# Patient Record
Sex: Male | Born: 1945 | Race: White | Hispanic: No | Marital: Married | Smoking: Never smoker
Health system: Southern US, Community
[De-identification: ages and names within clinical notes are randomized; demographics above are authoritative.]

## PROBLEM LIST (undated history)

## (undated) DIAGNOSIS — I10 Essential (primary) hypertension: Secondary | ICD-10-CM

## (undated) DIAGNOSIS — R197 Diarrhea, unspecified: Secondary | ICD-10-CM

## (undated) DIAGNOSIS — N400 Enlarged prostate without lower urinary tract symptoms: Secondary | ICD-10-CM

## (undated) DIAGNOSIS — F32A Depression, unspecified: Secondary | ICD-10-CM

## (undated) DIAGNOSIS — Z9989 Dependence on other enabling machines and devices: Secondary | ICD-10-CM

## (undated) DIAGNOSIS — F329 Major depressive disorder, single episode, unspecified: Secondary | ICD-10-CM

## (undated) DIAGNOSIS — E785 Hyperlipidemia, unspecified: Secondary | ICD-10-CM

## (undated) DIAGNOSIS — K219 Gastro-esophageal reflux disease without esophagitis: Secondary | ICD-10-CM

## (undated) DIAGNOSIS — I509 Heart failure, unspecified: Secondary | ICD-10-CM

## (undated) DIAGNOSIS — E669 Obesity, unspecified: Secondary | ICD-10-CM

## (undated) DIAGNOSIS — F419 Anxiety disorder, unspecified: Secondary | ICD-10-CM

## (undated) DIAGNOSIS — R091 Pleurisy: Secondary | ICD-10-CM

## (undated) DIAGNOSIS — G4733 Obstructive sleep apnea (adult) (pediatric): Secondary | ICD-10-CM

## (undated) DIAGNOSIS — R0902 Hypoxemia: Secondary | ICD-10-CM

## (undated) DIAGNOSIS — M1990 Unspecified osteoarthritis, unspecified site: Secondary | ICD-10-CM

## (undated) DIAGNOSIS — H269 Unspecified cataract: Secondary | ICD-10-CM

## (undated) DIAGNOSIS — G4730 Sleep apnea, unspecified: Secondary | ICD-10-CM

## (undated) DIAGNOSIS — I2510 Atherosclerotic heart disease of native coronary artery without angina pectoris: Secondary | ICD-10-CM

## (undated) HISTORY — PX: TONSILLECTOMY: SUR1361

## (undated) HISTORY — DX: Obesity, unspecified: E66.9

## (undated) HISTORY — DX: Hyperlipidemia, unspecified: E78.5

## (undated) HISTORY — DX: Benign prostatic hyperplasia without lower urinary tract symptoms: N40.0

## (undated) HISTORY — DX: Diarrhea, unspecified: R19.7

## (undated) HISTORY — DX: Essential (primary) hypertension: I10

## (undated) HISTORY — DX: Obstructive sleep apnea (adult) (pediatric): G47.33

## (undated) HISTORY — DX: Unspecified cataract: H26.9

## (undated) HISTORY — DX: Atherosclerotic heart disease of native coronary artery without angina pectoris: I25.10

## (undated) HISTORY — DX: Sleep apnea, unspecified: G47.30

## (undated) HISTORY — DX: Gastro-esophageal reflux disease without esophagitis: K21.9

## (undated) HISTORY — DX: Heart failure, unspecified: I50.9

## (undated) HISTORY — DX: Dependence on other enabling machines and devices: Z99.89

## (undated) HISTORY — DX: Unspecified osteoarthritis, unspecified site: M19.90

## (undated) HISTORY — DX: Major depressive disorder, single episode, unspecified: F32.9

## (undated) HISTORY — DX: Hypoxemia: R09.02

## (undated) HISTORY — DX: Pleurisy: R09.1

## (undated) HISTORY — DX: Depression, unspecified: F32.A

## (undated) HISTORY — DX: Anxiety disorder, unspecified: F41.9

---

## 1994-01-28 HISTORY — PX: LUNG DECORTICATION: SHX454

## 2003-06-05 ENCOUNTER — Emergency Department (HOSPITAL_COMMUNITY): Admission: EM | Admit: 2003-06-05 | Discharge: 2003-06-05 | Payer: Self-pay | Admitting: Emergency Medicine

## 2003-12-19 ENCOUNTER — Ambulatory Visit: Payer: Self-pay | Admitting: Internal Medicine

## 2003-12-20 ENCOUNTER — Ambulatory Visit: Payer: Self-pay

## 2004-01-27 ENCOUNTER — Ambulatory Visit: Payer: Self-pay | Admitting: Internal Medicine

## 2004-03-12 ENCOUNTER — Ambulatory Visit: Payer: Self-pay | Admitting: Internal Medicine

## 2004-09-03 ENCOUNTER — Ambulatory Visit: Payer: Self-pay | Admitting: Internal Medicine

## 2006-03-17 ENCOUNTER — Ambulatory Visit: Payer: Self-pay | Admitting: Internal Medicine

## 2006-06-09 ENCOUNTER — Ambulatory Visit: Payer: Self-pay | Admitting: Internal Medicine

## 2006-06-09 LAB — CONVERTED CEMR LAB
ALT: 37 units/L (ref 0–40)
Basophils Relative: 0.2 % (ref 0.0–1.0)
Bilirubin, Direct: 0.1 mg/dL (ref 0.0–0.3)
CO2: 31 meq/L (ref 19–32)
Cholesterol: 184 mg/dL (ref 0–200)
Direct LDL: 101.7 mg/dL
Eosinophils Relative: 1.1 % (ref 0.0–5.0)
GFR calc Af Amer: 110 mL/min
Glucose, Bld: 104 mg/dL — ABNORMAL HIGH (ref 70–99)
Hemoglobin: 15.6 g/dL (ref 13.0–17.0)
Lymphocytes Relative: 28 % (ref 12.0–46.0)
Monocytes Absolute: 0.7 10*3/uL (ref 0.2–0.7)
Neutro Abs: 4.1 10*3/uL (ref 1.4–7.7)
Neutrophils Relative %: 60.4 % (ref 43.0–77.0)
Potassium: 4.2 meq/L (ref 3.5–5.1)
TSH: 2.78 microintl units/mL (ref 0.35–5.50)
Total Protein: 6.8 g/dL (ref 6.0–8.3)
VLDL: 46 mg/dL — ABNORMAL HIGH (ref 0–40)
WBC: 6.8 10*3/uL (ref 4.5–10.5)

## 2006-06-16 ENCOUNTER — Ambulatory Visit: Payer: Self-pay | Admitting: Internal Medicine

## 2006-06-24 ENCOUNTER — Ambulatory Visit: Payer: Self-pay

## 2006-07-02 ENCOUNTER — Encounter: Payer: Self-pay | Admitting: Internal Medicine

## 2006-07-02 DIAGNOSIS — E785 Hyperlipidemia, unspecified: Secondary | ICD-10-CM

## 2006-07-02 DIAGNOSIS — E669 Obesity, unspecified: Secondary | ICD-10-CM

## 2006-07-02 DIAGNOSIS — N400 Enlarged prostate without lower urinary tract symptoms: Secondary | ICD-10-CM | POA: Insufficient documentation

## 2006-07-02 DIAGNOSIS — E291 Testicular hypofunction: Secondary | ICD-10-CM

## 2006-07-02 DIAGNOSIS — F329 Major depressive disorder, single episode, unspecified: Secondary | ICD-10-CM

## 2006-07-04 ENCOUNTER — Ambulatory Visit: Payer: Self-pay | Admitting: Gastroenterology

## 2006-07-22 ENCOUNTER — Encounter: Payer: Self-pay | Admitting: Gastroenterology

## 2006-07-22 ENCOUNTER — Ambulatory Visit: Payer: Self-pay | Admitting: Gastroenterology

## 2006-07-22 ENCOUNTER — Encounter: Payer: Self-pay | Admitting: Internal Medicine

## 2007-02-24 ENCOUNTER — Ambulatory Visit: Payer: Self-pay | Admitting: Internal Medicine

## 2007-02-24 DIAGNOSIS — R51 Headache: Secondary | ICD-10-CM

## 2007-02-24 DIAGNOSIS — R519 Headache, unspecified: Secondary | ICD-10-CM | POA: Insufficient documentation

## 2007-02-26 ENCOUNTER — Ambulatory Visit: Payer: Self-pay | Admitting: Cardiology

## 2007-03-05 ENCOUNTER — Telehealth (INDEPENDENT_AMBULATORY_CARE_PROVIDER_SITE_OTHER): Payer: Self-pay | Admitting: *Deleted

## 2007-03-16 ENCOUNTER — Telehealth: Payer: Self-pay | Admitting: Internal Medicine

## 2007-03-19 ENCOUNTER — Ambulatory Visit: Payer: Self-pay | Admitting: Internal Medicine

## 2007-03-20 ENCOUNTER — Telehealth: Payer: Self-pay | Admitting: Internal Medicine

## 2007-04-23 ENCOUNTER — Encounter: Payer: Self-pay | Admitting: Internal Medicine

## 2007-08-05 ENCOUNTER — Encounter: Payer: Self-pay | Admitting: Internal Medicine

## 2007-11-18 ENCOUNTER — Telehealth (INDEPENDENT_AMBULATORY_CARE_PROVIDER_SITE_OTHER): Payer: Self-pay

## 2007-12-08 ENCOUNTER — Ambulatory Visit: Payer: Self-pay | Admitting: Internal Medicine

## 2007-12-08 LAB — CONVERTED CEMR LAB
ALT: 31 units/L (ref 0–53)
AST: 29 units/L (ref 0–37)
Alkaline Phosphatase: 36 units/L — ABNORMAL LOW (ref 39–117)
Bilirubin, Direct: 0.1 mg/dL (ref 0.0–0.3)
CO2: 26 meq/L (ref 19–32)
Chloride: 110 meq/L (ref 96–112)
Glucose, Bld: 96 mg/dL (ref 70–99)
Hemoglobin: 14.5 g/dL (ref 13.0–17.0)
Ketones, urine, test strip: NEGATIVE
LDL Cholesterol: 93 mg/dL (ref 0–99)
Lymphocytes Relative: 26.6 % (ref 12.0–46.0)
Monocytes Relative: 7.8 % (ref 3.0–12.0)
Neutro Abs: 4.2 10*3/uL (ref 1.4–7.7)
Neutrophils Relative %: 64.5 % (ref 43.0–77.0)
Nitrite: NEGATIVE
Platelets: 218 10*3/uL (ref 150–400)
Potassium: 3.8 meq/L (ref 3.5–5.1)
RDW: 11.7 % (ref 11.5–14.6)
Sodium: 144 meq/L (ref 135–145)
Total Bilirubin: 0.9 mg/dL (ref 0.3–1.2)
Total CHOL/HDL Ratio: 4.6
Total Protein: 6.8 g/dL (ref 6.0–8.3)
Triglycerides: 185 mg/dL — ABNORMAL HIGH (ref 0–149)
Urobilinogen, UA: 0.2
VLDL: 37 mg/dL (ref 0–40)
WBC Urine, dipstick: NEGATIVE

## 2007-12-14 ENCOUNTER — Ambulatory Visit: Payer: Self-pay | Admitting: Internal Medicine

## 2007-12-15 ENCOUNTER — Telehealth (INDEPENDENT_AMBULATORY_CARE_PROVIDER_SITE_OTHER): Payer: Self-pay | Admitting: *Deleted

## 2008-01-06 ENCOUNTER — Telehealth: Payer: Self-pay | Admitting: Internal Medicine

## 2008-01-29 DIAGNOSIS — I2510 Atherosclerotic heart disease of native coronary artery without angina pectoris: Secondary | ICD-10-CM

## 2008-01-29 HISTORY — DX: Atherosclerotic heart disease of native coronary artery without angina pectoris: I25.10

## 2008-05-26 ENCOUNTER — Telehealth (INDEPENDENT_AMBULATORY_CARE_PROVIDER_SITE_OTHER): Payer: Self-pay | Admitting: *Deleted

## 2008-05-27 ENCOUNTER — Telehealth (INDEPENDENT_AMBULATORY_CARE_PROVIDER_SITE_OTHER): Payer: Self-pay | Admitting: *Deleted

## 2008-06-02 ENCOUNTER — Ambulatory Visit: Payer: Self-pay | Admitting: Family Medicine

## 2008-06-02 DIAGNOSIS — M7500 Adhesive capsulitis of unspecified shoulder: Secondary | ICD-10-CM

## 2008-06-02 DIAGNOSIS — R5381 Other malaise: Secondary | ICD-10-CM | POA: Insufficient documentation

## 2008-06-02 DIAGNOSIS — R5383 Other fatigue: Secondary | ICD-10-CM

## 2008-06-02 DIAGNOSIS — I10 Essential (primary) hypertension: Secondary | ICD-10-CM | POA: Insufficient documentation

## 2008-06-30 ENCOUNTER — Ambulatory Visit: Payer: Self-pay | Admitting: Family Medicine

## 2008-08-31 ENCOUNTER — Ambulatory Visit: Payer: Self-pay | Admitting: Family Medicine

## 2008-08-31 DIAGNOSIS — R0989 Other specified symptoms and signs involving the circulatory and respiratory systems: Secondary | ICD-10-CM | POA: Insufficient documentation

## 2008-08-31 DIAGNOSIS — R0609 Other forms of dyspnea: Secondary | ICD-10-CM | POA: Insufficient documentation

## 2008-09-13 ENCOUNTER — Encounter: Payer: Self-pay | Admitting: Family Medicine

## 2008-09-14 ENCOUNTER — Encounter: Payer: Self-pay | Admitting: Family Medicine

## 2008-09-23 ENCOUNTER — Telehealth (INDEPENDENT_AMBULATORY_CARE_PROVIDER_SITE_OTHER): Payer: Self-pay | Admitting: *Deleted

## 2008-09-28 ENCOUNTER — Telehealth: Payer: Self-pay | Admitting: Family Medicine

## 2008-09-29 ENCOUNTER — Ambulatory Visit: Payer: Self-pay | Admitting: Cardiology

## 2008-09-29 ENCOUNTER — Ambulatory Visit: Payer: Self-pay

## 2008-10-10 ENCOUNTER — Encounter: Payer: Self-pay | Admitting: Family Medicine

## 2008-10-10 ENCOUNTER — Encounter: Admission: RE | Admit: 2008-10-10 | Discharge: 2008-10-10 | Payer: Self-pay | Admitting: Family Medicine

## 2008-10-12 ENCOUNTER — Ambulatory Visit: Payer: Self-pay | Admitting: Critical Care Medicine

## 2008-10-12 ENCOUNTER — Ambulatory Visit: Payer: Self-pay | Admitting: Internal Medicine

## 2008-10-20 ENCOUNTER — Ambulatory Visit: Payer: Self-pay | Admitting: Critical Care Medicine

## 2008-10-21 ENCOUNTER — Encounter: Payer: Self-pay | Admitting: Critical Care Medicine

## 2008-10-26 ENCOUNTER — Ambulatory Visit: Payer: Self-pay | Admitting: Critical Care Medicine

## 2008-11-17 ENCOUNTER — Telehealth (INDEPENDENT_AMBULATORY_CARE_PROVIDER_SITE_OTHER): Payer: Self-pay | Admitting: *Deleted

## 2008-12-01 ENCOUNTER — Ambulatory Visit: Payer: Self-pay | Admitting: Family Medicine

## 2008-12-07 ENCOUNTER — Encounter: Payer: Self-pay | Admitting: Cardiology

## 2008-12-08 ENCOUNTER — Ambulatory Visit: Payer: Self-pay | Admitting: Cardiology

## 2008-12-08 DIAGNOSIS — R079 Chest pain, unspecified: Secondary | ICD-10-CM | POA: Insufficient documentation

## 2008-12-09 ENCOUNTER — Encounter: Payer: Self-pay | Admitting: Family Medicine

## 2008-12-14 ENCOUNTER — Ambulatory Visit: Payer: Self-pay | Admitting: Cardiology

## 2008-12-14 ENCOUNTER — Inpatient Hospital Stay (HOSPITAL_BASED_OUTPATIENT_CLINIC_OR_DEPARTMENT_OTHER): Admission: RE | Admit: 2008-12-14 | Discharge: 2008-12-14 | Payer: Self-pay | Admitting: Cardiology

## 2008-12-16 ENCOUNTER — Ambulatory Visit: Payer: Self-pay | Admitting: Cardiology

## 2008-12-16 ENCOUNTER — Ambulatory Visit (HOSPITAL_COMMUNITY): Admission: RE | Admit: 2008-12-16 | Discharge: 2008-12-16 | Payer: Self-pay | Admitting: Cardiology

## 2008-12-19 ENCOUNTER — Ambulatory Visit: Payer: Self-pay | Admitting: Cardiology

## 2008-12-19 DIAGNOSIS — I2510 Atherosclerotic heart disease of native coronary artery without angina pectoris: Secondary | ICD-10-CM | POA: Insufficient documentation

## 2008-12-19 LAB — CONVERTED CEMR LAB
ALT: 27 units/L (ref 0–53)
AST: 22 units/L (ref 0–37)
Alkaline Phosphatase: 47 units/L (ref 39–117)
CO2: 18 meq/L — ABNORMAL LOW (ref 19–32)
Creatinine, Ser: 1.05 mg/dL (ref 0.40–1.50)
LDL Cholesterol: 103 mg/dL — ABNORMAL HIGH (ref 0–99)
PSA: 0.87 ng/mL (ref 0.10–4.00)
Sodium: 142 meq/L (ref 135–145)
Total Bilirubin: 0.4 mg/dL (ref 0.3–1.2)
Total CHOL/HDL Ratio: 4.7
Total Protein: 6.5 g/dL (ref 6.0–8.3)
VLDL: 37 mg/dL (ref 0–40)

## 2008-12-26 ENCOUNTER — Telehealth: Payer: Self-pay | Admitting: Family Medicine

## 2008-12-28 ENCOUNTER — Ambulatory Visit: Payer: Self-pay | Admitting: Cardiology

## 2008-12-28 ENCOUNTER — Ambulatory Visit: Payer: Self-pay

## 2009-01-03 ENCOUNTER — Encounter: Payer: Self-pay | Admitting: Cardiology

## 2009-01-03 ENCOUNTER — Ambulatory Visit (HOSPITAL_COMMUNITY): Admission: RE | Admit: 2009-01-03 | Discharge: 2009-01-03 | Payer: Self-pay | Admitting: Cardiology

## 2009-01-03 ENCOUNTER — Ambulatory Visit: Payer: Self-pay | Admitting: Internal Medicine

## 2009-01-16 ENCOUNTER — Telehealth: Payer: Self-pay | Admitting: Cardiology

## 2009-01-17 ENCOUNTER — Telehealth (INDEPENDENT_AMBULATORY_CARE_PROVIDER_SITE_OTHER): Payer: Self-pay | Admitting: *Deleted

## 2009-01-18 ENCOUNTER — Ambulatory Visit: Payer: Self-pay | Admitting: Cardiology

## 2009-01-18 ENCOUNTER — Ambulatory Visit: Payer: Self-pay

## 2009-01-18 ENCOUNTER — Encounter (HOSPITAL_COMMUNITY): Admission: RE | Admit: 2009-01-18 | Discharge: 2009-01-27 | Payer: Self-pay | Admitting: Cardiology

## 2009-02-13 ENCOUNTER — Ambulatory Visit: Payer: Self-pay | Admitting: Cardiology

## 2009-02-17 ENCOUNTER — Telehealth: Payer: Self-pay | Admitting: Family Medicine

## 2009-02-20 LAB — CONVERTED CEMR LAB
ALT: 29 U/L
AST: 26 U/L
Albumin: 4 g/dL
Alkaline Phosphatase: 36 U/L — ABNORMAL LOW
Bilirubin, Direct: 0 mg/dL
Cholesterol: 161 mg/dL
Direct LDL: 89.4 mg/dL
HDL: 42.8 mg/dL
Total Bilirubin: 0.6 mg/dL
Total CHOL/HDL Ratio: 4
Total Protein: 6.5 g/dL
Triglycerides: 202 mg/dL — ABNORMAL HIGH
VLDL: 40.4 mg/dL — ABNORMAL HIGH

## 2009-03-03 ENCOUNTER — Ambulatory Visit: Payer: Self-pay | Admitting: Family Medicine

## 2009-03-06 LAB — CONVERTED CEMR LAB
BUN: 18 mg/dL (ref 6–23)
Hemoglobin: 14.2 g/dL (ref 13.0–17.0)
MCHC: 34.1 g/dL (ref 30.0–36.0)
PSA: 0.82 ng/mL (ref 0.10–4.00)
Platelets: 256 10*3/uL (ref 150–400)
Potassium: 4.6 meq/L (ref 3.5–5.3)
RDW: 12.8 % (ref 11.5–15.5)

## 2009-03-14 ENCOUNTER — Telehealth: Payer: Self-pay | Admitting: Family Medicine

## 2009-03-27 ENCOUNTER — Telehealth: Payer: Self-pay | Admitting: Family Medicine

## 2009-04-13 ENCOUNTER — Encounter (INDEPENDENT_AMBULATORY_CARE_PROVIDER_SITE_OTHER): Payer: Self-pay | Admitting: *Deleted

## 2009-06-07 ENCOUNTER — Ambulatory Visit: Payer: Self-pay | Admitting: Cardiology

## 2009-06-28 ENCOUNTER — Telehealth: Payer: Self-pay | Admitting: Cardiology

## 2009-07-04 ENCOUNTER — Telehealth: Payer: Self-pay | Admitting: Family Medicine

## 2009-07-14 ENCOUNTER — Telehealth: Payer: Self-pay | Admitting: Cardiology

## 2009-07-19 ENCOUNTER — Telehealth: Payer: Self-pay | Admitting: Cardiology

## 2009-11-28 ENCOUNTER — Ambulatory Visit: Payer: Self-pay | Admitting: Cardiology

## 2009-12-11 ENCOUNTER — Ambulatory Visit: Payer: Self-pay | Admitting: Family Medicine

## 2010-01-02 ENCOUNTER — Telehealth (INDEPENDENT_AMBULATORY_CARE_PROVIDER_SITE_OTHER): Payer: Self-pay | Admitting: *Deleted

## 2010-01-03 ENCOUNTER — Ambulatory Visit: Payer: Self-pay

## 2010-01-03 ENCOUNTER — Encounter (HOSPITAL_COMMUNITY)
Admission: RE | Admit: 2010-01-03 | Discharge: 2010-02-27 | Payer: Self-pay | Source: Home / Self Care | Attending: Cardiology | Admitting: Cardiology

## 2010-01-03 ENCOUNTER — Encounter: Payer: Self-pay | Admitting: *Deleted

## 2010-02-25 LAB — CONVERTED CEMR LAB
ALT: 31 units/L (ref 0–53)
Albumin: 4.4 g/dL (ref 3.5–5.2)
Albumin: 4.5 g/dL (ref 3.5–5.2)
Alkaline Phosphatase: 42 units/L (ref 39–117)
BUN: 18 mg/dL (ref 6–23)
Basophils Absolute: 0 10*3/uL (ref 0.0–0.1)
Basophils Relative: 0 % (ref 0.0–3.0)
Basophils Relative: 0 % (ref 0–1)
Bilirubin, Direct: 0.1 mg/dL (ref 0.0–0.3)
CO2: 18 meq/L — ABNORMAL LOW (ref 19–32)
Calcium: 9.2 mg/dL (ref 8.4–10.5)
Chloride: 108 meq/L (ref 96–112)
Chloride: 111 meq/L (ref 96–112)
Cholesterol: 153 mg/dL (ref 0–200)
Creatinine, Ser: 1.2 mg/dL (ref 0.4–1.5)
Eosinophils Absolute: 0.1 10*3/uL (ref 0.0–0.7)
Eosinophils Absolute: 0.1 10*3/uL (ref 0.0–0.7)
Glucose, Bld: 87 mg/dL (ref 70–99)
Glucose, Bld: 96 mg/dL (ref 70–99)
HCT: 41.2 % (ref 39.0–52.0)
Hemoglobin: 14.3 g/dL (ref 13.0–17.0)
Hemoglobin: 15.5 g/dL (ref 13.0–17.0)
Lymphs Abs: 1.6 10*3/uL (ref 0.7–4.0)
Lymphs Abs: 2.8 10*3/uL (ref 0.7–4.0)
MCHC: 33.6 g/dL (ref 30.0–36.0)
MCHC: 34.8 g/dL (ref 30.0–36.0)
MCV: 90.4 fL (ref 78.0–100.0)
MCV: 90.5 fL (ref 78.0–100.0)
Monocytes Absolute: 0.6 10*3/uL (ref 0.1–1.0)
Monocytes Absolute: 0.7 10*3/uL (ref 0.1–1.0)
Monocytes Relative: 9 % (ref 3–12)
Neutro Abs: 4.6 10*3/uL (ref 1.4–7.7)
Neutro Abs: 5 10*3/uL (ref 1.7–7.7)
Potassium: 4 meq/L (ref 3.5–5.1)
Prothrombin Time: 10.3 s (ref 9.1–11.7)
RBC: 4.56 M/uL (ref 4.22–5.81)
RBC: 5.1 M/uL (ref 4.22–5.81)
RDW: 12 % (ref 11.5–14.6)
Sodium: 140 meq/L (ref 135–145)
Total Bilirubin: 0.8 mg/dL (ref 0.3–1.2)
Total CHOL/HDL Ratio: 3
Total Protein: 7.1 g/dL (ref 6.0–8.3)
Total Protein: 7.2 g/dL (ref 6.0–8.3)
Triglycerides: 196 mg/dL — ABNORMAL HIGH (ref 0.0–149.0)
VLDL: 39.2 mg/dL (ref 0.0–40.0)
WBC: 7.3 10*3/uL (ref 4.0–10.5)

## 2010-02-27 NOTE — Assessment & Plan Note (Signed)
Summary: flu shot - jr  Nurse Visit   Vitals Entered By: Payton Spark CMA (December 11, 2009 9:59 AM)  Allergies: No Known Drug Allergies  Orders Added: 1)  Flu Vaccine 73yrs + MEDICARE PATIENTS [Q2039] 2)  Administration Flu vaccine - MCR [G0008] Flu Vaccine Consent Questions     Do you have a history of severe allergic reactions to this vaccine? no    Any prior history of allergic reactions to egg and/or gelatin? no    Do you have a sensitivity to the preservative Thimersol? no    Do you have a past history of Guillan-Barre Syndrome? no    Do you currently have an acute febrile illness? no    Have you ever had a severe reaction to latex? no    Vaccine information given and explained to patient? yes    Are you currently pregnant? no    Lot Number:[id number]   Exp Date:07/28/2010   Site Given  Left Deltoid IMmedflu

## 2010-02-27 NOTE — Progress Notes (Signed)
Summary: B/P readings-review B/P readings 07-14-09   Phone Note Outgoing Call   Call placed by: Katina Dung, RN, BSN,  July 14, 2009 10:46 AM Call placed to: Patient Summary of Call: B/P readings  Follow-up for Phone Call        Benicar changed to Losartan 50mg   06-29-09--call and get B/P --[facility] Katina Dung, RN, BSN  July 14, 2009 10:48 AM   Additional Follow-up for Phone Call Additional follow up Details #1::        BP READINGS 6/6 7:30AM 130/65 9:30 116/67 5:30 105/60 6/7 9:00AM 127/68 5:20120/67 6/8 9:00AM 139/76 9:20 116/68 8:30PM 119/62 6/9 120/63 10:00AM 111/65 6/10 10:30AM 127/65 6/11 11:30AM 124/71 6:20 122/61 6/12 2:30PM 126/63 8:28PM 133/65 8:50PM 110/62 6/13 11:00AM 116/68 3:26PM 110/60 6/14 12:30 116/64 6:00PM 126/64 6/15 10:30AM 126/69 10:00PM 123/63 6/16 10:00AM 122/74 2:45 111/64 7:17PM 131/65  Still has SOB with activity will forward to Dr Shirlee Latch for review Additional Follow-up by: Migdalia Dk,  July 14, 2009 10:57 AM     Appended Document: B/P readings-review B/P readings 07-14-09 BP looks good.   Appended Document: B/P readings-review B/P readings 07-14-09 discussed with pt by telephone

## 2010-02-27 NOTE — Letter (Signed)
Summary: Appointment - Reminder 2  [facility], Main Office  [address]   [place], [place] [address]   Phone: [phone]  Fax: [phone]     April 13, 2009 MRN: [id number]   [facility] [address] [place], [place]  [address]   Dear Mr. HETLAND,  Our records indicate that it is time to schedule a follow-up appointment with Dr. Shirlee Latch. It is very important that we reach you to schedule this appointment. We look forward to participating in your health care needs. Please contact us at the number listed above at your earliest convenience to schedule your appointment.  If you are unable to make an appointment at this time, give [place] a call so we can update our records.     Sincerely,   Migdalia Dk [facility] Scheduling Team

## 2010-02-27 NOTE — Progress Notes (Signed)
Summary: Nuclear Pre-Procedure  Phone Note Outgoing Call   Call placed by: Milana Na, EMT-P,  January 02, 2010 4:15 PM Summary of Call: Left message with information on Myoview Information Sheet (see scanned document for details).      Nuclear Med Background Indications for Stress Test: Evaluation for Ischemia  Indications Comments: 01/03/09 Abnormal Cardiopulmonary GXT-possible ischemia.  History: COPD, Emphysema, Heart Catheterization, Myocardial Perfusion Study  History Comments: '08 MPS:No ischemia, EF=72%; 11/10 Cath:n/o CAD-LAD  Symptoms: Chest Tightness, Chest Tightness with Exertion, DOE, SOB    Nuclear Pre-Procedure Cardiac Risk Factors: Family History - CAD, Hypertension, Lipids, Obesity Height (in): 72.5  Nuclear Med Study Referring MD:  D.Mclean

## 2010-02-27 NOTE — Progress Notes (Signed)
Summary: crestor prescription  Medications Added CRESTOR 40 MG TABS (ROSUVASTATIN CALCIUM) one daily or as directed       Phone Note Call from Patient   Caller: Patient Call For: Dr Shirlee Latch  Summary of Call: Crestor prescription  Follow-up for Phone Call        requesting 90 days Crestor 40mg  prescription--take one daily or as directed -pt knows  to take one-half daily--reviewed with Dr Shirlee Latch and OK'd    New/Updated Medications: CRESTOR 40 MG TABS (ROSUVASTATIN CALCIUM) one daily or as directed Prescriptions: CRESTOR 40 MG TABS (ROSUVASTATIN CALCIUM) one daily or as directed  #90 x 3   Entered by:   Katina Dung, RN, BSN   Authorized by:   Marca Ancona, MD   Signed by:   Katina Dung, RN, BSN on 07/19/2009   Method used:   Print then Give to Patient   RxID:   [id number]

## 2010-02-27 NOTE — Letter (Signed)
Summary: AUA Questionnaire/[place] Jeremy Woodward  AUA Questionnaire/[place] Jeremy Woodward   Imported By: Lanelle Bal 03/09/2009 12:49:51  _____________________________________________________________________  External Attachment:    Type:   Image     Comment:   External Document

## 2010-02-27 NOTE — Progress Notes (Signed)
Summary: Lexapro denied  Phone Note Call from Patient   Caller: Patient Summary of Call: Pt's Rx plan denied Lexapro bc he must use a generic. Pt is open to changing to similar generic but not wellbutrin. Please advise.  Initial call taken by: Payton Spark CMA,  February 17, 2009 9:13 AM    New/Updated Medications: CITALOPRAM HYDROBROMIDE 10 MG TABS (CITALOPRAM HYDROBROMIDE) 1 tab by mouth daily Prescriptions: CITALOPRAM HYDROBROMIDE 10 MG TABS (CITALOPRAM HYDROBROMIDE) 1 tab by mouth daily  #30 x 1   Entered and Authorized by:   Seymour Bars DO   Signed by:   Seymour Bars DO on 02/17/2009   Method used:   Electronically to        [facility] [phone]* (retail)       [address] [place], [place]  [address]       Ph: [phone] or [phone]       Fax: [phone]   RxID:   [id number]   Appended Document: Lexapro denied Pt aware

## 2010-02-27 NOTE — Progress Notes (Signed)
Summary: Melatonin ?  Phone Note Call from Patient   Caller: Patient Summary of Call: Pt wanted to know if it will be OK to take melatonin in place of Ambien? Initial call taken by: Payton Spark CMA,  July 04, 2009 8:44 AM  Follow-up for Phone Call        he can try this if he wants.  do not combine. Follow-up by: Seymour Bars DO,  July 04, 2009 8:57 AM     Appended Document: Melatonin ? Pt's wife aware

## 2010-02-27 NOTE — Progress Notes (Signed)
Summary: ? Crestor  Phone Note Call from Patient   Caller: Patient Summary of Call: Pt LMOM stating he has accidently been taking Crestor two times a day. Pt is now out of med and wants to make sure that taking 2 for 3 weeks will not affect him. Please advise.  Initial call taken by: Payton Spark CMA,  March 27, 2009 8:25 AM  Follow-up for Phone Call        Pls make sure that pt only takes his Crestor once a day (in the evenings). Not likely to cause any harm the way he was taking it. Follow-up by: Seymour Bars DO,  March 27, 2009 8:29 AM     Appended Document: ? Crestor LMOM informing Pt of the above.

## 2010-02-27 NOTE — Assessment & Plan Note (Signed)
Summary: 6 month/dmp  Medications Added GABAPENTIN 600 MG TABS (GABAPENTIN) take one tablet once daily COREG 6.25 MG TABS (CARVEDILOL) one tablet twice a day      Allergies Added: NKDA  Referring Provider:  Dr. Cathey Endow Primary Provider:  Seymour Bars DO  CC:  6 month follow up.  Pt still having some SOB but no chest pains.  .  History of Present Illness: 65 yo with history of empyema and VATS/decortication, HTN, CAD, and exertional shortness of breath.  I did a left and right heart catheterization given his exertional chest pain and dyspnea.  Right heart cath showed normal left and right heart filling pressures and no pulmonary hypertension.   Left heart cath showed 50% distal left main stenosis and 50% proximal LAD stenosis.  IVUS and FFR were done of both lesions, and it appeared that both were not hemodynamically significant and not likely to be the cause of his symptoms.  He had a cardiopulmonary stress test in 12/10 to try to elucidate the cause of his shortness of breath.  This was actually suggestive of possible ischemia.  Therefore, he did an ETT-myoview in 12/10 as well.  He became quite short of breath but exercised 8 minutes with no ECG changes and no evidence for ischemia or infarction.  It was decided to continue medical management.   He has been doing well over the last few months.  He is trying to get back into exercising more.  Unfortunately, he has gained about 19 lbs since the last appointment.  He is not short of breath walking up steps.  He goes for hikes in the woods with shortness of breath if he goes up a hill.  He rides his bike for exercise and has no problem on flat ground.  He gets short of breath with hills.  No chest pain.    Labs (8/10): creatinine 1.3, BNP 4 Labs (11/10): creatinine 1.05, K 4.4, LDL 103, HDL 38 Labs (1/11): LDL 89 Labs (2/11): creatinine 1.2  ECG: NSR, iRBBB  Current Medications (verified): 1)  Ambien 10 Mg Tabs (Zolpidem Tartrate) .... Take 1  Tablet By Mouth At Bedtime As Needed 2)  Crestor 20 Mg Tabs (Rosuvastatin Calcium) .... One Tablet Daily 3)  Gabapentin 600 Mg Tabs (Gabapentin) .... Take One Tablet Once Daily 4)  Benicar 20 Mg  Tabs (Olmesartan Medoxomil) .... One Tablet By Mouth Daily 5)  Zyrtec Allergy 10 Mg Tabs (Cetirizine Hcl) .... Once Daily 6)  Fish Oil 1000 Mg Caps (Omega-3 Fatty Acids) .Marland Kitchen.. 1 Tab Three Times A Day 7)  Multivitamins  Tabs (Multiple Vitamin) .... Once Daily 8)  Astepro 0.15 % Soln (Azelastine Hcl) .... Two Sprays Each Nostril Daily 9)  Aspirin 81 Mg Tbec (Aspirin) .Marland Kitchen.. 1 Tablet Daily 10)  Citalopram Hydrobromide 10 Mg Tabs (Citalopram Hydrobromide) .Marland Kitchen.. 1 Tab By Mouth Daily 11)  Aleve .... As Needed 12)  Diphen 2.5 Mg .... As Needed For Diarrhea 13)  Aloe Vera Capsule 2.5 Mg .... Two Times A Day For Heartburn  Allergies (verified): No Known Drug Allergies  Past History:  Past Medical History: 1. Depression 2. Hyperlipidemia 3. Obesity 4. Benign prostatic hypertrophy 5. cervical radiculopathy, 1995 6. cough - induced headache, Dr Neale Burly 7. HTN: ACEI cough and probable upper airways instability. 8. CAD: ETT (9/10): 9', 10.4 METS, no ECG changes. Mild chest tightness at peak exercise.  LHC (11/10): EF 55%, 50% distal left main, 50% proximal LAD.  Patient underwent both IVUS and FFR, both  suggested that the left main and LAD stenoses were not hemodynamically significant.  Cardiopulmonary exercise test then done in 12/10 to assess for cause of dyspnea.  This showed a hypertensive response to exercise and possible diastolic dysfunction.  No ECG changes.  There was an abrupt decrease in oxygen pulse raising concern for ischemia.  Therefore ETT-myoview was done in 12/10.  Patient went 8 minutes and stopped due to SOB.  No ECG changes and no evidence for ischemia or infarction. Medical management continued.  9. Exertional dyspnea: Pulmonary workup with PFTs (9/10) showing spirometry, DLCO, and lung  volumes not significantly abnormal.  CTA chest without PE.  ACEI changed to ARB with some improvement in symptoms suggesting component of upper airways instability.  Right heart catheterization (11/10): normal left and right heart filling pressures, no pulmonary HTN. Cardiopulmonary exercise test as described above.   pulm Dr Delford Field cards Dr Shirlee Latch  Family History: Reviewed history from 12/08/2008 and no changes required. - father died age 60, liver cancer, lung ca - mother died at 44, status post CABG, peptic ulcer disease, hypertension, history of abdominal aortic aneurysm.  Had MI in her 8s.  - one sister with obesity, hypertension, hyperlipidemia - MGM-stomach cancer, abdominal aortic aneurysm - Uncle with MI  Social History: Reviewed history from 12/08/2008 and no changes required. 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   3-4cigars/month x12yrs.  quit in 2005. Married 1 son, 1 step daughter Lives in [place]  Review of Systems       All systems reviewed and negative except as per HPI.   Vital Signs:  Patient profile:   65 year old male Height:      72.5 inches Weight:      269 pounds BMI:     36.11 Pulse rate:   74 / minute Pulse rhythm:   regular BP sitting:   126 / 80  (left arm) Cuff size:   large  Vitals Entered By: Judithe Modest CMA (Jun 07, 2009 8:42 AM)  Physical Exam  General:  Well developed, well nourished, in no acute distress. Obese.  Neck:  Neck supple, no JVD. No masses, thyromegaly or abnormal cervical nodes. Lungs:  Clear bilaterally to auscultation and percussion. Heart:  Non-displaced PMI, chest non-tender; regular rate and rhythm, S1, S2 without murmurs, rubs or gallops. Carotid upstroke normal, no bruit.  Pedals normal pulses. No edema, no varicosities. Abdomen:  Bowel sounds positive; abdomen soft and non-tender without masses, organomegaly, or hernias noted. No hepatosplenomegaly. Extremities:  No  clubbing or cyanosis. Neurologic:  Alert and oriented x 3. Psych:  Normal affect.   Impression & Recommendations:  Problem # 1:  CAD, NATIVE VESSEL (ICD-414.01) 50% left main stenosis on cath but IVUS and FFR suggested that it was no hemodynamically significant.  Cardiopulmonary exercise test done to assess for cause of shortness of breath suggested diastolic dysfunction but also suggested possible ischemia.  Therefore, ETT-myoview was done showing no ECG changes and no evidence for ischemia or infarction.  I am managing the patient medically at this time.  His exertional dyspnea is actually improving with increased exercise.  No chest pain.  - Continue statin with goal LDL < 70 - ASA, ARB - Initiate beta blocker today: Coreg 6.25 mg two times a day.   Problem # 2:  DYSPNEA ON EXERTION (ICD-786.09) Cause is still difficult to ascertain.  Possible deconditioning/weight gain, possible diastolic dysfunction.  CPX suggested possibility of ischemia but other testing has not been suggestive  of ischemia.  Dyspnea is improving with increased exercise recently.  He needs to work on diet and exercise for weight loss.    Problem # 3:  HYPERLIPIDEMIA (ICD-272.4) Lipids/LFTs today with goal LDL < 70.   Other Orders: EKG w/ Interpretation (93000) TLB-Lipid Panel (80061-LIPID) TLB-Hepatic/Liver Function Pnl (80076-HEPATIC)  Patient Instructions: 1)  Your physician has recommended you make the following change in your medication:  2)  start Coreg(carvedilol) 6.25mg  twice a day 3)  Your physician recommends that you have  a  lipid profile/liver profile today---414.01 272.4 4)  Your physician wants you to follow-up in: 6 months with Dr Shirlee Latch. You will receive a reminder letter in the mail two months in advance. If you don't receive a letter, please call our office to schedule the follow-up appointment. Prescriptions: COREG 6.25 MG TABS (CARVEDILOL) one tablet twice a day  #60 x 11   Entered by:   Katina Dung, RN, BSN   Authorized by:   Marca Ancona, MD   Signed by:   Katina Dung, RN, BSN on 06/07/2009   Method used:   [facility]        [facility] [phone][facility] (retail)       [address] [place], [place]  [address]       Ph: [phone] or [phone]       Fax: [phone]   RxID:   [id number]

## 2010-02-27 NOTE — Progress Notes (Signed)
Summary: HA med changed  Phone Note Call from Patient   Caller: Patient Summary of Call: Pt LMOM stating that his HA doctor changed toprimate to neurontin. Pt wanted to know if you agree w/ this?  Please advise.  Initial call taken by: Payton Spark CMA,  March 14, 2009 9:56 AM  Follow-up for Phone Call        that is fine.  Pls update EMR. Follow-up by: Seymour Bars DO,  March 14, 2009 10:11 AM     Appended Document: HA med changed LMOM informing Pt. Pt to CB w/ dose and schedule

## 2010-02-27 NOTE — Assessment & Plan Note (Signed)
Summary: per check out/sf  Medications Added ZONISAMIDE 100 MG CAPS (ZONISAMIDE) 3 daily NAPROXEN SODIUM 550 MG TABS (NAPROXEN SODIUM) as needed      Allergies Added: NKDA  Visit Type:  Follow-up Referring Provider:  Dr. Cathey Endow Primary Provider:  Seymour Bars DO  CC:  Sob- Tiredness.  History of Present Illness: 65 yo with history of empyema and VATS/decortication, HTN, CAD, and exertional shortness of breath.  I did a left and right heart catheterization given his exertional chest pain and dyspnea.  Right heart cath showed normal left and right heart filling pressures and no pulmonary hypertension.   Left heart cath showed 50% distal left main stenosis and 50% proximal LAD stenosis.  IVUS and FFR were done of both lesions, and it appeared that both were not hemodynamically significant and not likely to be the cause of his symptoms.  He had a cardiopulmonary stress test in 12/10 to try to elucidate the cause of his shortness of breath.  This was actually suggestive of possible ischemia.  Therefore, he did an ETT-myoview in 12/10 as well.  He became quite short of breath but exercised 8 minutes with no ECG changes and no evidence for ischemia or infarction.  It was decided to continue medical management.   Symptoms have been stable.  He is short of breath walking up hills when he goes on hikes.  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  He does get short of breath if he bends over.  No significant dyspnea when walking on flat ground.  Patient has occasional momentary (seconds) sharp left-sided chest pain that often occurs with exertion.   The pain lasts only for a second or two and goes away despite continued exertion.  Weight is down 4 lbs since I saw him last.  He has started CPAP for OSA.   Labs (8/10): creatinine 1.3, BNP 4 Labs (11/10): creatinine 1.05, K 4.4, LDL 103, HDL 38 Labs (1/11): LDL 89 Labs  (2/11): creatinine 1.2  ECG: NSR, normal  Current Medications (verified): 1)  Crestor 40 Mg Tabs (Rosuvastatin Calcium) .... One Daily or As Directed 2)  Losartan Potassium 50 Mg Tabs (Losartan Potassium) .... One Tablet Daily 3)  Multivitamins  Tabs (Multiple Vitamin) .... Once Daily 4)  Aspirin 81 Mg Tbec (Aspirin) .Marland Kitchen.. 1 Tablet Daily 5)  Citalopram Hydrobromide 10 Mg Tabs (Citalopram Hydrobromide) .Marland Kitchen.. 1 Tab By Mouth Daily 6)  Coreg 6.25 Mg Tabs (Carvedilol) .... One Tablet Twice A Day 7)  Zonisamide 100 Mg Caps (Zonisamide) .... 3 Daily 8)  Naproxen Sodium 550 Mg Tabs (Naproxen Sodium) .... As Needed  Allergies (verified): No Known Drug Allergies  Past History:  Past Medical History: 1. Depression 2. Hyperlipidemia 3. Obesity 4. Benign prostatic hypertrophy 5. cervical radiculopathy, 1995 6. cough - induced headache, Dr Neale Burly 7. HTN: ACEI cough and probable upper airways instability. 8. CAD: ETT (9/10): 9', 10.4 METS, no ECG changes. Mild chest tightness at peak exercise.  LHC (11/10): EF 55%, 50% distal left main, 50% proximal LAD.  Patient underwent both IVUS and FFR, both suggested that the left main and LAD stenoses were not hemodynamically significant.  Cardiopulmonary exercise test then done in 12/10 to assess for cause of dyspnea.  This showed a hypertensive response to exercise and possible diastolic dysfunction.  No ECG changes.  There was an abrupt decrease in oxygen pulse raising concern for ischemia.  Therefore ETT-myoview was done  in 12/10.  Patient went 8 minutes and stopped due to SOB.  No ECG changes and no evidence for ischemia or infarction. Medical management continued.  9. Exertional dyspnea: Pulmonary workup with PFTs (9/10) showing spirometry, DLCO, and lung volumes not significantly abnormal.  CTA chest without PE.  ACEI changed to ARB with some improvement in symptoms suggesting component of upper airways instability.  Right heart catheterization (11/10):  normal left and right heart filling pressures, no pulmonary HTN. Cardiopulmonary exercise test as described above.  10. OSA: Using CPAP.   pulm Dr Delford Field cards Dr Shirlee Latch  Family History: Reviewed history from 12/08/2008 and no changes required. - father died age 20, liver cancer, lung ca - mother died at 51, status post CABG, peptic ulcer disease, hypertension, history of abdominal aortic aneurysm.  Had MI in her 30s.  - one sister with obesity, hypertension, hyperlipidemia - MGM-stomach cancer, abdominal aortic aneurysm - Uncle with MI  Social History: Reviewed history from 12/08/2008 and no changes required. 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  quit in 2005. Married 1 son, 1 step daughter Lives in [place]  Review of Systems       All systems reviewed and negative except as per HPI.   Vital Signs:  Patient profile:   65 year old male Height:      72.5 inches Weight:      265 pounds BMI:     35.57 Pulse rate:   62 / minute Pulse rhythm:   regular Resp:     18 per minute BP sitting:   118 / 82  (left arm) Cuff size:   large  Vitals Entered By: Vikki Ports (November 28, 2009 9:44 AM)  Physical Exam  General:  Well developed, well nourished, in no acute distress. Obese.  Neck:  Neck supple, no JVD. No masses, thyromegaly or abnormal cervical nodes. Lungs:  Clear bilaterally to auscultation and percussion. Heart:  Non-displaced PMI, chest non-tender; regular rate and rhythm, S1, S2 without murmurs, rubs or gallops. Carotid upstroke normal, no bruit.  Pedals normal pulses. No edema, no varicosities. Abdomen:  Bowel sounds positive; abdomen soft and non-tender without masses, organomegaly, or hernias noted. No hepatosplenomegaly. Extremities:  No clubbing or cyanosis. Neurologic:  Alert and oriented x 3. Psych:  Normal affect.   Impression & Recommendations:  Problem # 1:  CAD, NATIVE VESSEL  (ICD-414.01) 50% left main stenosis on cath but IVUS and FFR suggested that it was not hemodynamically significant.  Cardiopulmonary exercise test done to assess for cause of shortness of breath suggested diastolic dysfunction but also suggested possible ischemia.  Therefore, ETT-myoview was done in 12/10 showing no ECG changes and no evidence for ischemia or infarction.  I am managing the patient medically at this time.  He has mild exertional dyspnea that is stable as well as atypical chest pain.  - Continue statin with goal LDL < 70 - ASA, ARB, beta blocker - Given left main lesion, I am going to repeat ETT-myoview at one year (12/11) to assess for progression.   Problem # 2:  DYSPNEA ON EXERTION (ICD-786.09) Cause is still difficult to ascertain.  Possible deconditioning/weight gain, possible diastolic dysfunction.  CPX suggested possibility of ischemia but other testing has not been suggestive of ischemia.  Dyspnea is improving with increased exercise recently.  He needs to continue to work on diet and exercise for weight loss.    Problem # 3:  HYPERLIPIDEMIA (ICD-272.4) Lipids/LFTs today,  goal LDL < 70.   Other Orders: Nuclear Stress Test (Nuc Stress Test) TLB-Lipid Panel (80061-LIPID) TLB-Hepatic/Liver Function Pnl (80076-HEPATIC)  Patient Instructions: 1)  Your physician recommends that you return for a FASTING lipid profile/liver profile  414.01  786.09 2)  Your physician has requested that you have an exercise stress myoview.  For further information please visit [email or web address]  Please follow instruction sheet, as given. 3)  Your physician wants you to follow-up in: 6 months with Dr Shirlee Latch.  You will receive a reminder letter in the mail two months in advance. If you don't receive a letter, please call our office to schedule the follow-up appointment.

## 2010-02-27 NOTE — Progress Notes (Signed)
Summary: pt wants to discuss meds   Phone Note Call from Patient Call back at Home Phone [phone]   Caller: Patient Reason for Call: Talk to Nurse, Talk to Doctor Summary of Call: per pt call he was calling to tellus what generic the drug company will approve it is called Losartan instead of Benicar and pt is completely [facility] [place] [facility] [address] pt also would like to get crestor in 40mg  and cut it in half to cut the cost pt would like to have the crestor Rx mailed to him so he can send to his mail order when he needs it if thats ok with Dr. Shirlee Latch Initial call taken by: Omer Jack,  June 28, 2009 10:30 AM  Follow-up for Phone Call        Spoke with pt. Patient states  the medication his insurance covers is [place] instead of Benicar. Pt. states when Dr. Shirlee Latch okay the medication, he would like the [facility] [place] [facility] [address]  Pt. also would like to have a prescription for Crestor 40 mg and cut pill in half to cut the cost of medication.  Pt. would like the prescription  mailed to him a 90 days supply. Pt. will mail the prescription to his mail order pharmacy. I let pt. know will send message to the MD and his nurse. Okay with pt.     Appended Document: pt wants to discuss meds d/c Benicar, start losartan 50 mg daily.  BP check over phone in 2 wks.   Appended Document: pt wants to discuss meds discussed with pt by telephone   Clinical Lists Changes  Medications: Changed medication from BENICAR 20 MG  TABS (OLMESARTAN MEDOXOMIL) One tablet by mouth daily to LOSARTAN POTASSIUM 50 MG TABS (LOSARTAN POTASSIUM) one tablet daily - Signed Rx of LOSARTAN POTASSIUM 50 MG TABS (LOSARTAN POTASSIUM) one tablet daily;  #30 x 11;  Signed;  Entered by: Katina Dung, RN, BSN;  Authorized by: Marca Ancona, MD;  Method used: [facility]ically to [facility] [phone]*, [address] [place], [place]  [address], Ph:  [phone] or [phone], Fax: [phone]    Prescriptions: LOSARTAN POTASSIUM 50 MG TABS (LOSARTAN POTASSIUM) one tablet daily  #30 x 11   Entered by:   Katina Dung, RN, BSN   Authorized by:   Marca Ancona, MD   Signed by:   Katina Dung, RN, BSN on 06/29/2009   Method used:   Electronically to        [facility] [id number] (retail)       [address] [place], [place]  [address]       Ph: [phone] or [phone]       Fax: [phone]   RxID:   [id number]

## 2010-02-27 NOTE — Assessment & Plan Note (Signed)
Summary: CPE   Vital Signs:  Patient profile:   65 year old male Height:      72.5 inches Weight:      256.25 pounds BMI:     34.40 Pulse rate:   81 / minute BP sitting:   114 / 69  Vitals Entered By: Kandice Hams (March 03, 2009 8:36 AM) CC: cpx   Primary Care Provider:  Seymour Bars DO  CC:  cpx.  History of Present Illness: 65 yo WM presents for CPE.    He has hx of CAD,. HAs, depression, HTN and dyslipidemia.  He has undergone a full pulmonary and cardiology w/u in the past year for c/o DOE.  His cardiologist has cleared him to start exercising which he is not yet doing.  His statin was recently changed to Crestor in hopes of getting his LDL to a goal of <70.  Denies any CP, leg swelling.  Mood is stable.  His colonoscopy is UTD.  Needs PNX for new dx of CAD.  Tetanus is UTD.  He is due for prostate cancer screening today.  Hx of BPH -- off meds.    He is seeing the [facility] - on Topomax which has helped.    Allergies: No Known Drug Allergies  Past History:  Past Medical History: Reviewed history from 12/19/2008 and no changes required. 1. Depression 2. Hyperlipidemia 3. Obesity 4. Benign prostatic hypertrophy 5. cervical radiculopathy, 1995 6. cough - induced headache, Dr Neale Burly 7. HTN: ACEI cough and probable upper airways instability. 8. CAD: ETT-myoview (5/08) with EF 72%, inferior fixed perfusion defect thought probably to be attenuation.  ETT (9/10): 9', 10.4 METS, no ECG changes. Mild chest tightness at peak exercise.  LHC (11/10): EF 55%, 50% distal left main, 50% proximal LAD.  Patient underwent both IVUS and FFR, both suggested that the left main and LAD stenoses were not hemodynamically significant.  9. Exertional dyspnea: Pulmonary workup with PFTs (9/10) showing spirometry, DLCO, and lung volumes not significantly abnormal.  CTA chest without PE.  ACEI changed to ARB with some improvement in symptoms suggesting component of upper airways  instability.  Right heart catheterization (11/10): normal left and right heart filling pressures, no pulmonary HTN.   pulm Dr Delford Field cards Dr Shirlee Latch  Past Surgical History: Reviewed history from 12/14/2007 and no changes required. colonoscopy May 2008 Tonsillectomy status post right lung decortication and drainage utilizing VAT  for empyema  1996 Cardiolite stress test May 2008  Family History: Reviewed history from 12/08/2008 and no changes required. - father died age 34, liver cancer, lung ca - mother died at 57, status post CABG, peptic ulcer disease, hypertension, history of abdominal aortic aneurysm.  Had MI in her 60s.  - one sister with obesity, hypertension, hyperlipidemia - MGM-stomach cancer, abdominal aortic aneurysm - Uncle with MI  Social History: Reviewed history from 12/08/2008 and no changes required. 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   3-4cigars/month x74yrs.  quit in 2005. Married 1 son, 1 step daughter Lives in [place]  Review of Systems       The patient complains of dyspnea on exertion.  The patient denies anorexia, fever, weight loss, weight gain, vision loss, decreased hearing, hoarseness, chest pain, syncope, peripheral edema, prolonged cough, headaches, hemoptysis, abdominal pain, melena, hematochezia, severe indigestion/heartburn, hematuria, incontinence, genital sores, muscle weakness, suspicious skin lesions, transient blindness, difficulty walking, depression, unusual weight change, abnormal bleeding, enlarged lymph nodes, angioedema, breast masses, and testicular masses.  Physical Exam  General:  obese WM in NAD Head:  normocephalic and atraumatic.   Eyes:  pupils equal, pupils round, and pupils reactive to light.   Ears:  EACs patent; TMs translucent and gray with good cone of light and bony landmarks.  Nose:  no nasal discharge.   Mouth:  pharynx pink and moist and fair dentition.   Neck:  no  masses.  no audible carotid brutis Lungs:  Normal respiratory effort, chest expands symmetrically. Lungs are clear to auscultation, no crackles or wheezes. Heart:  Normal rate and regular rhythm. S1 and S2 normal without gallop, murmur, click, rub or other extra sounds. Abdomen:  Bowel sounds positive,abdomen soft and non-tender without masses, organomegaly or hernias noted.  no AA bruits Rectal:  hemoccult neg.  small hemorrhoids Prostate:  Prostate gland firm and smooth, no enlargement, nodularity, tenderness, mass, asymmetry or induration. Pulses:  2+ radial and pedal pulses Extremities:  trace bilat ankle edema, non pitting Skin:  dry skin with ichythosis over both feet Cervical Nodes:  No lymphadenopathy noted Psych:  good eye contact, not anxious appearing, and not depressed appearing.     Impression & Recommendations:  Problem # 1:  HEALTH MAINTENANCE EXAM (ICD-V70.0) Keeping healthy checklist for men reviewed. BP at goal of <130/80. BMI 34 c/w Class I obesity.  Needs to work on healthy diet, regular exercise (cleared by cardiology) and wt loss. Pneumovax given prior to age 50 due to new dx of CAD. Continue mens MVI daily, ASA daily, Vitamin D 1000 International Units daily. DRE + PSA today for prostate cancer screening.   Colonoscopy done 07-22-06 with Dr Arlyce Dice + for a 4 mm polyp -- may need 5 yr repeat. Tetanus updated 11-2008. Cholesterol f/u with [place] [facility] in March for new start Crestor.   RTC in 6 mos, sooner if needed.  Problem # 2:  BENIGN PROSTATIC HYPERTROPHY (ICD-600.00) AUA symptom score of 17. Will give him RX for Flomax to try at night for the next 8 wks. If he is happy w/ results, will continue it.    Complete Medication List: 1)  Ambien 10 Mg Tabs (Zolpidem tartrate) .... Take 1 tablet by mouth at bedtime as needed 2)  Crestor 20 Mg Tabs (Rosuvastatin calcium) .... One tablet daily 3)  Topiramate 100 Mg Tabs (Topiramate) .Marland Kitchen.. 1 tab by mouth bid 4)   Meloxicam 15 Mg Tabs (Meloxicam) .Marland Kitchen.. 1 by mouth daily 5)  Benicar 20 Mg Tabs (Olmesartan medoxomil) .... One tablet by mouth daily 6)  Zyrtec Allergy 10 Mg Tabs (Cetirizine hcl) .... Once daily 7)  Fish Oil 1000 Mg Caps (Omega-3 fatty acids) .Marland Kitchen.. 1 tab three times a day 8)  Multivitamins Tabs (Multiple vitamin) .... Once daily 9)  Astepro 0.15 % Soln (Azelastine hcl) .... Two sprays each nostril daily 10)  Aspirin 81 Mg Tbec (Aspirin) .Marland Kitchen.. 1 tablet daily 11)  Citalopram Hydrobromide 10 Mg Tabs (Citalopram hydrobromide) .Marland Kitchen.. 1 tab by mouth daily 12)  Aleve  .... As needed 13)  Diphen 2.5 Mg  .... As needed for diarrhea 14)  Aloe Vera Capsule 2.5 Mg  .... Two times a day for heartburn 15)  Tamsulosin Hcl 0.4 Mg Caps (Tamsulosin hcl) .Marland Kitchen.. 1 tab by mouth each night  Other Orders: T-CBC No Diff ([id number]) T-PSA (09811-91478) T-Basic Metabolic Panel ([id number]) Pneumococcal Vaccine ([address]) Admin 1st Vaccine ([address]295)  Patient Instructions: 1)  Pneumovax today for dx of Coronary artery dz.  (repeat at 73). 2)  Stay on current meds.  3)  Update labs today. 4)  Will call you w/ results Monday. 5)  Return to [facility] cardiology for f/u in July. 6)  Cholesterol f/u with cards in March. 7)  Return for f/u in 6 mos. Prescriptions: TAMSULOSIN HCL 0.4 MG CAPS (TAMSULOSIN HCL) 1 tab by mouth each night  #30 x 1   Entered and Authorized by:   Seymour Bars DO   Signed by:   Seymour Bars DO on 03/04/2009   Method used:   [facility]        [facility] [phone] (retail)       [address] [place], [place]  [address]       Ph: [phone] or [phone]       Fax: [phone]   RxID:   [id number]    Pneumovax Vaccine    Site: left deltoid    Mfr: Merck    Dose: 0.5 ml    Route: IM    Given by: Kandice Hams    Exp. Date: 02/24/2010    Lot #: [id number]    VIS given: 08/26/95 version given March 03, 2009.

## 2010-03-01 NOTE — Assessment & Plan Note (Signed)
Summary: Cardiology Nuclear Testing  Nuclear Med Background Indications for Stress Test: Evaluation for Ischemia  Indications Comments: .  History: COPD, Emphysema, Heart Catheterization, Myocardial Perfusion Study  History Comments: 11/10 Cath:n/o CAD; 12/10 Cardio-Pulm. ZOX:WRUE. ischemia>MPS:mild inferior thinning, no ischemia  Symptoms: Chest Pain, Chest Pain with Exertion, DOE, Fatigue  Symptoms Comments: Last episode of AV:WUJW weekend.   Nuclear Pre-Procedure Cardiac Risk Factors: Family History - CAD, Hypertension, Lipids, Obesity Caffeine/Decaff Intake: None NPO After: 7:30 PM Lungs: Clear IV 0.9% NS with Angio Cath: 22g     IV Site: R Hand IV Started by: Bonnita Levan, RN Chest Size (in) 50     Height (in): 72.5 Weight (lb): 270 BMI: 36.25  Nuclear Med Study 1 or 2 day study:  1 day     Stress Test Type:  Stress Reading MD:  Cassell Clement, MD     Referring MD:  Marca Ancona, MD Resting Radionuclide:  Technetium 76m Tetrofosmin     Resting Radionuclide Dose:  11.0 mCi  Stress Radionuclide:  Technetium 49m Tetrofosmin     Stress Radionuclide Dose:  33.0 mCi   Stress Protocol Exercise Time (min):  8:31 min     Max HR:  134 bpm     Predicted Max HR:  155 bpm  Max Systolic BP: 206 mm Hg     Percent Max HR:  86.45 %     METS: 10.3 Rate Pressure Product:  11914    Stress Test Technologist:  [employer]a [employer], CMA-N     Nuclear Technologist:  Domenic Polite, CNMT  Rest Procedure  Myocardial perfusion imaging was performed at rest 45 minutes following the intravenous administration of Technetium 24m Tetrofosmin.  Stress Procedure  The patient exercised for 8:31.  The patient stopped due to fatigue and denied any chest pain.  There were no significant ST-T wave changes.  There was a mild hypertensive response to exercise, 206/76.  Technetium 66m Tetrofosmin was injected at peak exercise and myocardial perfusion imaging was performed after a brief delay.  QPS Raw Data  Images:  Normal; no motion artifact; normal heart/lung ratio. Stress Images:  Normal homogeneous uptake in all areas of the myocardium. Rest Images:  Normal homogeneous uptake in all areas of the myocardium. Subtraction (SDS):  No evidence of ischemia. Transient Ischemic Dilatation:  0.98  (Normal <1.22)  Lung/Heart Ratio:  0.41  (Normal <0.45)  Quantitative Gated Spect Images QGS EDV:  97 ml QGS ESV:  33 ml QGS EF:  66 % QGS cine images:  No wall motion abnormalities  Findings Normal nuclear study      Overall Impression  Exercise Capacity: Good exercise capacity. BP Response: Hypertensive blood pressure response. Clinical Symptoms: No chest pain ECG Impression: No significant ST segment change suggestive of ischemia. Overall Impression: Normal stress nuclear study.  Appended Document: Cardiology Nuclear Testing normal study  Appended Document: Cardiology Nuclear Testing pt notified of results by telephone

## 2010-03-08 ENCOUNTER — Encounter: Payer: Self-pay | Admitting: Family Medicine

## 2010-04-05 NOTE — Letter (Signed)
Summary: Myrtis Ser [facility]   [facility]   Imported By: Kassie Mends 03/29/2010 09:26:07  _____________________________________________________________________  External Attachment:    Type:   Image     Comment:   External Document

## 2010-05-02 LAB — POCT I-STAT 3, ART BLOOD GAS (G3+)
Acid-base deficit: 7 mmol/L — ABNORMAL HIGH (ref 0.0–2.0)
O2 Saturation: 96 %
pCO2 arterial: 23.8 mmHg — ABNORMAL LOW (ref 35.0–45.0)

## 2010-05-02 LAB — CBC
HCT: 43 % (ref 39.0–52.0)
Hemoglobin: 14.9 g/dL (ref 13.0–17.0)
MCHC: 34.8 g/dL (ref 30.0–36.0)
RDW: 12.7 % (ref 11.5–15.5)

## 2010-05-02 LAB — POCT I-STAT 3, VENOUS BLOOD GAS (G3P V)
Acid-base deficit: 8 mmol/L — ABNORMAL HIGH (ref 0.0–2.0)
Bicarbonate: 15.7 mEq/L — ABNORMAL LOW (ref 20.0–24.0)

## 2010-05-02 LAB — BASIC METABOLIC PANEL
CO2: 22 mEq/L (ref 19–32)
Glucose, Bld: 101 mg/dL — ABNORMAL HIGH (ref 70–99)
Potassium: 5.1 mEq/L (ref 3.5–5.1)
Sodium: 140 mEq/L (ref 135–145)

## 2010-06-12 NOTE — Assessment & Plan Note (Signed)
[facility] OFFICE NOTE   Jeremy Woodward, Jeremy Woodward                      MRN:          [id number]  DATE:06/16/2006                            DOB:          01-27-1946    A 65 year old gentleman is seen today for an annual exam.  He has  multiple concerns, mainly musculoskeletal pain involving back, abdomen.  For the past three weeks, he has had some exertional chest pain and  shortness of breath and had a negative ETT about three years ago.  He is  being treated for depression and hypercholesterolemia.   FAMILY HISTORY:  Positive for coronary artery disease.   REVIEW OF SYSTEMS:  Also reveals some reflux symptoms, polyuria, and  nocturia.  He does consume considerable amounts of fluids and  caffeinated products.   FAMILY HISTORY:  Father died of lung cancer.  Mother died at 90, status  post CABG, also had a history of abdominal aortic aneurysm.   PHYSICAL EXAMINATION:  VITAL SIGNS:  Revealed an overweight male with a  weight of 278, blood pressure 138/80.  FUNDI/EARS/NOSE/THROAT:  Clear.  NECK:  No bruits.  CHEST:  Clear.  CARDIOVASCULAR:  Normal heart sounds.  No murmurs.  ABDOMEN:  Obese, soft, and nontender.  No organomegaly.  EXTERNAL GENITALIA:  Normal.  EXTREMITIES:  Negative.  Peripheral pulses were full.  SKIN:  The soles of his feet are quite dry and thickened.  RECTAL:  Prostate plus one.  Stool heme negative.   IMPRESSION:  1. Exertional chest pain.  2. Hypercholesterolemia.  3. Exogenous obesity.   DISPOSITION:  1. He was set up for a Cardiolite stress test as well as a      colonoscopy.  2. He will report any clinical worsening.  3. He will moderate his fluid intake and caffeine and hopefully his      nocturia as well as heartburn will improve.  4. Reassess in a few months.    Gordy Savers, MD  Electronically Signed   PFK/MedQ  DD: 06/16/2006  DT: 06/16/2006  Job #: [id number]

## 2010-06-19 ENCOUNTER — Other Ambulatory Visit: Payer: Self-pay | Admitting: Cardiology

## 2010-09-27 ENCOUNTER — Encounter: Payer: Self-pay | Admitting: Family Medicine

## 2010-10-06 ENCOUNTER — Other Ambulatory Visit: Payer: Self-pay | Admitting: Cardiology

## 2010-10-11 ENCOUNTER — Encounter: Payer: Self-pay | Admitting: Family Medicine

## 2010-10-11 ENCOUNTER — Ambulatory Visit (INDEPENDENT_AMBULATORY_CARE_PROVIDER_SITE_OTHER): Payer: Medicare Other | Admitting: Family Medicine

## 2010-10-11 VITALS — BP 119/67 | HR 65 | Ht 75.0 in | Wt 277.0 lb

## 2010-10-11 DIAGNOSIS — N529 Male erectile dysfunction, unspecified: Secondary | ICD-10-CM

## 2010-10-11 DIAGNOSIS — Z23 Encounter for immunization: Secondary | ICD-10-CM

## 2010-10-11 DIAGNOSIS — Z2911 Encounter for prophylactic immunotherapy for respiratory syncytial virus (RSV): Secondary | ICD-10-CM

## 2010-10-11 DIAGNOSIS — R0602 Shortness of breath: Secondary | ICD-10-CM

## 2010-10-11 DIAGNOSIS — K625 Hemorrhage of anus and rectum: Secondary | ICD-10-CM

## 2010-10-11 DIAGNOSIS — Z0000 Encounter for general adult medical examination without abnormal findings: Secondary | ICD-10-CM

## 2010-10-11 DIAGNOSIS — R42 Dizziness and giddiness: Secondary | ICD-10-CM

## 2010-10-11 DIAGNOSIS — R5383 Other fatigue: Secondary | ICD-10-CM

## 2010-10-11 DIAGNOSIS — H6120 Impacted cerumen, unspecified ear: Secondary | ICD-10-CM

## 2010-10-11 DIAGNOSIS — I2510 Atherosclerotic heart disease of native coronary artery without angina pectoris: Secondary | ICD-10-CM

## 2010-10-11 DIAGNOSIS — I10 Essential (primary) hypertension: Secondary | ICD-10-CM

## 2010-10-11 LAB — COMPREHENSIVE METABOLIC PANEL
BUN: 19 mg/dL (ref 6–23)
CO2: 19 mEq/L (ref 19–32)
Calcium: 9.2 mg/dL (ref 8.4–10.5)
Chloride: 111 mEq/L (ref 96–112)
Creat: 1.2 mg/dL (ref 0.50–1.35)
Glucose, Bld: 102 mg/dL — ABNORMAL HIGH (ref 70–99)

## 2010-10-11 LAB — HEMOGLOBIN A1C
Hgb A1c MFr Bld: 5.9 % — ABNORMAL HIGH (ref <5.7)
Mean Plasma Glucose: 123 mg/dL — ABNORMAL HIGH (ref <117)

## 2010-10-11 LAB — TSH: TSH: 2.671 u[IU]/mL (ref 0.350–4.500)

## 2010-10-11 LAB — CBC WITH DIFFERENTIAL/PLATELET
Basophils Absolute: 0 10*3/uL (ref 0.0–0.1)
HCT: 41.6 % (ref 39.0–52.0)
Hemoglobin: 13.6 g/dL (ref 13.0–17.0)
Lymphocytes Relative: 29 % (ref 12–46)
Monocytes Absolute: 0.5 10*3/uL (ref 0.1–1.0)
Monocytes Relative: 8 % (ref 3–12)
Neutro Abs: 3.8 10*3/uL (ref 1.7–7.7)
RDW: 13.3 % (ref 11.5–15.5)
WBC: 6.2 10*3/uL (ref 4.0–10.5)

## 2010-10-11 LAB — POCT URINALYSIS DIPSTICK
Blood, UA: NEGATIVE
Glucose, UA: NEGATIVE
Nitrite, UA: NEGATIVE
Protein, UA: NEGATIVE
Urobilinogen, UA: 0.2
pH, UA: 6

## 2010-10-11 LAB — LIPID PANEL
Cholesterol: 125 mg/dL (ref 0–200)
VLDL: 36 mg/dL (ref 0–40)

## 2010-10-11 MED ORDER — MECLIZINE HCL 12.5 MG PO TABS: 12.5000 mg | ORAL_TABLET | Freq: Three times a day (TID) | ORAL | Status: DC | PRN

## 2010-10-11 MED ORDER — TETANUS-DIPHTH-ACELL PERTUSSIS 5-2.5-18.5 LF-MCG/0.5 IM SUSP: 0.5000 mL | Freq: Once | INTRAMUSCULAR | Status: DC

## 2010-10-11 NOTE — Patient Instructions (Signed)
Return ibn 3 months Follow up w/Cardiologist in November Referral back to [facility] for colonoscopy Use antivert prn

## 2010-10-11 NOTE — Progress Notes (Signed)
  Subjective:    Patient ID: Jeremy Woodward, male    DOB: [birth year], 65 y.o.   MRN: [id number]  HPI    Review of Systems  Constitutional: Positive for activity change and fatigue.  HENT:       He did get some wax out of his L ear  Respiratory: Positive for shortness of breath and stridor.        He has been evaluated by the Pulmionologist for this and nop answear has been found.  Cardiovascular: Negative for chest pain, palpitations and leg swelling.       States he is supposed to see the cardiologist in November  Genitourinary:       Erectile dysfunction is still a problem  Musculoskeletal: Positive for gait problem.  Neurological: Positive for dizziness and weakness.  Psychiatric/Behavioral: Negative.        Objective:   Physical Exam  Constitutional: He is oriented to person, place, and time. He appears well-developed and well-nourished.  HENT:  Head: Normocephalic.  Left Ear: External ear normal.       R ear is plug w/wax.   Eyes: Conjunctivae are normal. Pupils are equal, round, and reactive to light.  Neck: Normal range of motion. No JVD present. No tracheal deviation present. No thyromegaly present.  Cardiovascular: Normal rate, regular rhythm and normal heart sounds.   Pulmonary/Chest: Effort normal and breath sounds normal. No respiratory distress.  Abdominal: Soft. Bowel sounds are normal. He exhibits no mass.  Genitourinary: Prostate normal and penis normal. Guaiac positive stool.  Musculoskeletal: Normal range of motion.  Lymphadenopathy:    He has no cervical adenopathy.  Neurological: He is alert and oriented to person, place, and time. He has normal reflexes.  Skin: Skin is warm.  Psychiatric: He has a normal mood and affect.          Assessment & Plan:  Will get referral for colonoscopy Testosterone and PSA Irrigate the R ear today  And trial of meclizine as well

## 2010-10-12 LAB — TESTOSTERONE, FREE, TOTAL, SHBG
Sex Hormone Binding: 42 nmol/L (ref 13–71)
Testosterone: 217.37 ng/dL — ABNORMAL LOW (ref 250–890)

## 2010-10-15 ENCOUNTER — Telehealth: Payer: Self-pay | Admitting: Family Medicine

## 2010-10-15 NOTE — Telephone Encounter (Signed)
Called [facility] and cancelled pt appt. Called pt and notified him appt was cancelled and informed him since already established with Dr. Marina Goodell just needed to call and set uiphis colonoscopy. Pt verbalized understanding

## 2010-10-15 NOTE — Telephone Encounter (Signed)
Pt called and said Jeremy Woodward had set him up with [facility] for GI referral, but he had seen [facility] in [place] Dr. Marina Goodell in the past and wants to cont with [facility]. Pt request to send message to Jeremy Woodward to cancer referral with [facility] and resched him with Dr. Marina Goodell at [facility]. Plan:  Call routed to Kathlene November to re-do the referral. Jarvis Newcomer, LPN [facility] Dimes

## 2010-10-18 ENCOUNTER — Telehealth: Payer: Self-pay | Admitting: Family Medicine

## 2010-10-18 NOTE — Telephone Encounter (Signed)
Message copied by Gifford Shave on Thu Oct 18, 2010 10:16 AM ------      Message from: Nani Gasser D      Created: Thu Oct 18, 2010 10:13 AM       Blood sugar borderline still.  Liver and kidneys are stable. Blood count is nl.  PSA is up a little from last time but still normal. Recheck in 4 months. Testosterone is low.  We can recheck testosterone in 2-3 weeks and if still low then we can consider testosteone replacement if he would like.  LDL looks good, but TG are high.  Keep taking crestor but add 2 fish oil tabs a day.

## 2010-10-18 NOTE — Telephone Encounter (Signed)
Pt called for lab results.  Has been over 1 week and has not heard anything. Plan:  Pt notified with his lab results, and instructions given. Jarvis Newcomer, LPN [facility] Dimes

## 2010-10-18 NOTE — Telephone Encounter (Signed)
Closed

## 2010-10-22 ENCOUNTER — Telehealth: Payer: Self-pay | Admitting: Family Medicine

## 2010-10-22 NOTE — Telephone Encounter (Signed)
Message copied by Gifford Shave on Mon Oct 22, 2010 10:38 AM ------      Message from: Nani Gasser D      Created: Thu Oct 18, 2010 10:13 AM       Blood sugar borderline still.  Liver and kidneys are stable. Blood count is nl.  PSA is up a little from last time but still normal. Recheck in 4 months. Testosterone is low.  We can recheck testosterone in 2-3 weeks and if still low then we can consider testosteone replacement if he would like.  LDL looks good, but TG are high.  Keep taking crestor but add 2 fish oil tabs a day.

## 2010-10-22 NOTE — Telephone Encounter (Signed)
Pt informed of lab results and follow up regimen.  Had to [facility] with details. Jarvis Newcomer, LPN [facility] Dimes

## 2010-10-23 ENCOUNTER — Encounter: Payer: Self-pay | Admitting: Family Medicine

## 2010-10-23 DIAGNOSIS — G4733 Obstructive sleep apnea (adult) (pediatric): Secondary | ICD-10-CM | POA: Insufficient documentation

## 2010-10-23 DIAGNOSIS — Z9989 Dependence on other enabling machines and devices: Secondary | ICD-10-CM

## 2010-10-30 ENCOUNTER — Encounter: Payer: Self-pay | Admitting: Family Medicine

## 2010-10-30 ENCOUNTER — Ambulatory Visit (INDEPENDENT_AMBULATORY_CARE_PROVIDER_SITE_OTHER): Payer: Medicare Other | Admitting: Gastroenterology

## 2010-10-30 ENCOUNTER — Encounter: Payer: Self-pay | Admitting: Gastroenterology

## 2010-10-30 VITALS — BP 120/68 | HR 62 | Ht 75.0 in | Wt 272.0 lb

## 2010-10-30 DIAGNOSIS — R195 Other fecal abnormalities: Secondary | ICD-10-CM

## 2010-10-30 MED ORDER — PEG-KCL-NACL-NASULF-NA ASC-C 100 G PO SOLR: 1.0000 | Freq: Once | ORAL | Status: DC

## 2010-10-30 NOTE — Progress Notes (Signed)
History of Present Illness:  Jeremy Woodward is a pleasant 65 year old white male referred at the request of [facility] for evaluation of Hemoccult-positive stools. This was noted on routine testing. He has no GI complaints including change in bowel habits, melena or hematochezia. He is without abdominal pain. He is on no gastric irritants. Hemoglobin 2 weeks ago was 13.5.  Medical problems include coronary artery disease, hypertension and mild sleep apnea.    Review of Systems: He complains of fatigue and neck pain. He also has chronic dyspnea on exertion. Pertinent positive and negative review of systems were noted in the above HPI section. All other review of systems were otherwise negative.    Current Medications, Allergies, Past Medical History, Past Surgical History, Family History and Social History were reviewed in [facility] electronic medical record  Vital signs were reviewed in today's medical record. Physical Exam: General: Well developed , well nourished, no acute distress Head: Normocephalic and atraumatic Eyes:  sclerae anicteric, EOMI Ears: Normal auditory acuity Mouth: No deformity or lesions Lungs: Clear throughout to auscultation Heart: Regular rate and rhythm; no murmurs, rubs or bruits Abdomen: Soft, non tender and non distended. No masses, hepatosplenomegaly or hernias noted. Normal Bowel sounds Rectal:deferred Musculoskeletal: Symmetrical with no gross deformities  Pulses:  Normal pulses noted Extremities: No clubbing, cyanosis, edema or deformities noted Neurological: Alert oriented x 4, grossly nonfocal Psychological:  Alert and cooperative. Normal mood and affect

## 2010-10-30 NOTE — Patient Instructions (Signed)
Colonoscopy A colonoscopy is an exam to evaluate your entire colon. In this exam, your colon is cleansed. A long fiberoptic tube is inserted through your rectum and into your colon. The fiberoptic scope (endoscope) is a long bundle of enclosed and very flexible fibers. These fibers transmit light to the area examined and send images from that area to your caregiver. Discomfort is usually minimal. You may be given a drug to help you sleep (sedative) during or prior to the procedure. This exam helps to detect lumps (tumors), polyps, inflammation, and areas of bleeding. Your caregiver may also take a small piece of tissue (biopsy) that will be examined under a microscope. BEFORE THE PROCEDURE  A clear liquid diet may be required for 2 days before the exam.   Liquid injections (enemas) or laxatives may be required.   A large amount of electrolyte solution may be given to you to drink over a short period of time. This solution is used to clean out your colon.   You should be present 1 prior to your procedure or as directed by your caregiver.   Check in at the admissions desk to fill out necessary forms if not preregistered. There will be consent forms to sign prior to the procedure. If accompanied by friends or family, there is a waiting area for them while you are having your procedure.  LET YOUR CAREGIVER KNOW ABOUT:  Allergies to food or medicine.  Medicines taken, including vitamins, herbs, eyedrops, over-the-counter medicines, and creams.   Use of steroids (by mouth or creams).   Previous problems with anesthetics or numbing medicines.   History of bleeding problems or blood clots.  Previous surgery.   Other health problems, including diabetes and kidney problems.   Possibility of pregnancy, if this applies.   AFTER THE PROCEDURE  If you received a sedative and/or pain medicine, you will need to arrange for someone to drive you home.   Occasionally, there is a little blood passed  with the first bowel movement. DO NOT be concerned.  HOME CARE INSTRUCTIONS  It is not unusual to pass moderate amounts of gas and experience mild abdominal cramping following the procedure. This is due to air being used to inflate your colon during the exam. Walking or a warm pack on your belly (abdomen) may help.   You may resume all normal meals and activities after sedatives and medicines have worn off.   Only take over-the-counter or prescription medicines for pain, discomfort, or fever as directed by your caregiver. DO NOT use aspirin or blood thinners if a biopsy was taken. Consult your caregiver for medicine usage if biopsies were taken.  FINDING OUT THE RESULTS OF YOUR TEST Not all test results are available during your visit. If your test results are not back during the visit, make an appointment with your caregiver to find out the results. Do not assume everything is normal if you have not heard from your caregiver or the medical facility. It is important for you to follow up on all of your test results. SEEK IMMEDIATE MEDICAL CARE IF:  You have an oral temperature above 100, not controlled by medicine.   You pass large blood clots or fill a toilet with blood following the procedure. This may also occur 10 to 14 days following the procedure. This is more likely if a biopsy was taken.   You develop abdominal pain that keeps getting worse and cannot be relieved with medicine.  Document Released: 01/12/2000 Document Re-Released: 04/10/2009   Exit[employer] Patient Information 2011 [employer]. 

## 2010-10-30 NOTE — Assessment & Plan Note (Signed)
Plan colonoscopy. If negative I will obtain followup Hemoccults.

## 2010-11-01 ENCOUNTER — Encounter: Payer: Self-pay | Admitting: Gastroenterology

## 2010-11-02 ENCOUNTER — Ambulatory Visit: Payer: Medicare Other | Attending: Psychiatry | Admitting: Physical Therapy

## 2010-11-02 DIAGNOSIS — R42 Dizziness and giddiness: Secondary | ICD-10-CM | POA: Insufficient documentation

## 2010-11-02 DIAGNOSIS — R269 Unspecified abnormalities of gait and mobility: Secondary | ICD-10-CM | POA: Insufficient documentation

## 2010-11-02 DIAGNOSIS — IMO0001 Reserved for inherently not codable concepts without codable children: Secondary | ICD-10-CM | POA: Insufficient documentation

## 2010-11-05 ENCOUNTER — Ambulatory Visit (AMBULATORY_SURGERY_CENTER): Payer: Medicare Other | Admitting: Gastroenterology

## 2010-11-05 ENCOUNTER — Encounter: Payer: Self-pay | Admitting: Gastroenterology

## 2010-11-05 DIAGNOSIS — D126 Benign neoplasm of colon, unspecified: Secondary | ICD-10-CM

## 2010-11-05 DIAGNOSIS — R195 Other fecal abnormalities: Secondary | ICD-10-CM

## 2010-11-05 MED ORDER — SODIUM CHLORIDE 0.9 % IV SOLN: 500.0000 mL | INTRAVENOUS | Status: DC

## 2010-11-05 NOTE — Progress Notes (Signed)
Pt had cramping while the scope was being advanced.  Medications were titrated per Dr. Marzetta Board orders.  Pt relaxed as the scope was being withdrawn and rested comforatably with his eyes closed. maw

## 2010-11-05 NOTE — Patient Instructions (Signed)
Polyps, Colon  A polyp is extra tissue that grows inside your body. Colon polyps grow in the large intestine. The large intestine, also called the colon, is part of your digestive system. It is a long, hollow tube at the end of your digestive tract where your body makes and stores stool. Most polyps are not dangerous. They are benign. This means they are not cancerous. But over time, some types of polyps can turn into cancer. Polyps that are smaller than a pea are usually not harmful. But larger polyps could someday become or may already be cancerous. To be safe, doctors remove all polyps and test them.  WHO GETS POLYPS? Anyone can get polyps, but certain people are more likely than others. You may have a greater chance of getting polyps if:  You are over 50.   You have had polyps before.   Someone in your family has had polyps.   Someone in your family has had cancer of the large intestine.   Find out if someone in your family has had polyps. You may also be more likely to get polyps if you:   Eat a lot of fatty foods   Smoke   Drink alcohol   Do not exercise  Eat too much  SYMPTOMS Most small polyps do not cause symptoms. People often do not know they have one until their caregiver finds it during a regular checkup or while testing them for something else. Some people do have symptoms like these:  Bleeding from the anus. You might notice blood on your underwear or on toilet paper after you have had a bowel movement.   Constipation or diarrhea that lasts more than a week.   Blood in the stool. Blood can make stool look black or it can show up as red streaks in the stool.  If you have any of these symptoms, see your caregiver. HOW DOES THE DOCTOR TEST FOR POLYPS? The doctor can use four tests to check for polyps:  Digital rectal exam. The caregiver wears gloves and checks your rectum (the last part of the large intestine) to see if it feels normal. This test would find polyps only  in the rectum. Your caregiver may need to do one of the other tests listed below to find polyps higher up in the intestine.   Barium enema. The caregiver puts a liquid called barium into your rectum before taking x-rays of your large intestine. Barium makes your intestine look white in the pictures. Polyps are dark, so they are easy to see.   Sigmoidoscopy. With this test, the caregiver can see inside your large intestine. A thin flexible tube is placed into your rectum. The device is called a sigmoidoscope, which has a light and a tiny video camera in it. The caregiver uses the sigmoidoscope to look at the last third of your large intestine.   Colonoscopy. This test is like sigmoidoscopy, but the caregiver looks at all of the large intestine. It usually requires sedation. This is the most common method for finding and removing polyps.  TREATMENT  The caregiver will remove the polyp during sigmoidoscopy or colonoscopy. The polyp is then tested for cancer.   If you have had polyps, your caregiver may want you to get tested regularly in the future.  PREVENTION There is not one sure way to prevent polyps. You might be able to lower your risk of getting them if you:  Eat more fruits and vegetables and less fatty food.     Do not smoke.   Avoid alcohol.   Exercise every day.   Lose weight if you are overweight.   Eating more calcium and folate can also lower your risk of getting polyps. Some foods that are rich in calcium are milk, cheese, and broccoli. Some foods that are rich in folate are chickpeas, kidney beans, and spinach.   Aspirin might help prevent polyps. Studies are under way.  Document Released: 10/11/2003 Document Re-Released: 07/04/2009 [facility] Patient Information 2011 [place], [place].  PLEASE FOLLOW DISCHARGE INSTRUCTIONS GIVEN TODAY. SEE HANDOUTS. RESUME CURRENT MEDICATIONS TODAY. COLON POLYP X1 REMOVED TODAY AND SENT TO LAB. YOU WILL RECEIVE RESULT LETTER IN YOUR MAIL IN 1-2  WEEKS. FOLLOW UP HEMOCCULTS STOOL CARD, DO IN 5-7 DAYS. CARD GIVEN, MAIL BACK TO [place] WHEN COMPLETED. CALL us WITH ANY QUESTIONS OR CONCERNS.

## 2010-11-05 NOTE — Progress Notes (Signed)
PATIENT C/O DIZZINESS WHEN SITTING UP IN BED. PATIENT DID GET UP TO BATHROOM AND INTO WHEELCHAIR AND HE STATES HE WAS READY TO GO HOME. PATIENT EXPLAINED HE WAS HAVING VERTIGO WAY BEFORE TODAY.

## 2010-11-06 ENCOUNTER — Telehealth: Payer: Self-pay | Admitting: *Deleted

## 2010-11-06 NOTE — Telephone Encounter (Signed)
Follow up Call- Patient questions:  Do you have a fever, pain , or abdominal swelling? no Pain Score  0 *  Have you tolerated food without any problems? no  Have you been able to return to your normal activities? yes  Do you have any questions about your discharge instructions: Diet   no Medications  no Follow up visit  no  Do you have questions or concerns about your Care? no  Actions: * If pain score is 4 or above: No action needed, pain <4. Wrong button pushed for diet, patient is tolerating food we, no problems.

## 2011-01-14 ENCOUNTER — Other Ambulatory Visit: Payer: Self-pay | Admitting: Cardiology

## 2011-01-15 ENCOUNTER — Ambulatory Visit (INDEPENDENT_AMBULATORY_CARE_PROVIDER_SITE_OTHER): Payer: Medicare Other | Admitting: Cardiology

## 2011-01-15 ENCOUNTER — Encounter: Payer: Self-pay | Admitting: Cardiology

## 2011-01-15 VITALS — BP 120/66 | HR 76 | Ht 75.0 in | Wt 276.0 lb

## 2011-01-15 DIAGNOSIS — I2510 Atherosclerotic heart disease of native coronary artery without angina pectoris: Secondary | ICD-10-CM

## 2011-01-15 DIAGNOSIS — I10 Essential (primary) hypertension: Secondary | ICD-10-CM

## 2011-01-15 DIAGNOSIS — R079 Chest pain, unspecified: Secondary | ICD-10-CM

## 2011-01-15 DIAGNOSIS — R0609 Other forms of dyspnea: Secondary | ICD-10-CM

## 2011-01-15 DIAGNOSIS — E785 Hyperlipidemia, unspecified: Secondary | ICD-10-CM

## 2011-01-15 MED ORDER — ROSUVASTATIN CALCIUM 40 MG PO TABS: 40.0000 mg | ORAL_TABLET | Freq: Every day | ORAL | Status: DC

## 2011-01-15 MED ORDER — LOSARTAN POTASSIUM 50 MG PO TABS: 50.0000 mg | ORAL_TABLET | Freq: Every day | ORAL | Status: DC

## 2011-01-15 NOTE — Patient Instructions (Addendum)
Your physician has requested that you have an exercise tolerance test with Dr Shirlee Latch. For further information please visit [email or web address] Please also follow instruction sheet, as given.  Your physician wants you to follow-up in: 1 year.   You will receive a reminder letter in the mail two months in advance. If you don't receive a letter, please call our office to schedule the follow-up appointment.

## 2011-01-15 NOTE — Assessment & Plan Note (Signed)
Lipids at goal (LDL < 70) in 9/12.

## 2011-01-15 NOTE — Progress Notes (Signed)
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  I did a left and right heart catheterization in 11/10 given his exertional chest pain and dyspnea. Right heart cath showed normal left and right heart filling pressures and no pulmonary hypertension. Left heart cath showed 50% distal left main stenosis and 50% proximal LAD stenosis. IVUS and FFR were done of both lesions, and it appeared that both were not hemodynamically significant and not likely to be the cause of his symptoms. He had a cardiopulmonary stress test in 12/10 to try to elucidate the cause of his shortness of breath. This was actually suggestive of possible ischemia. Therefore, he did an ETT-myoview in 12/10 as well. He became quite short of breath but exercised 8 minutes with no ECG changes and no evidence for ischemia or infarction. It was decided to continue medical management.  He had an ETT-myoview a year later in 12/11, showing no ischemia or infarction.   Symptoms have been stable. He is short of breath walking up hills when he goes on hikes. 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  However, he is not getting regular exercise.  He does get short of breath if he bends over. No significant dyspnea when walking on flat ground. Patient has occasional momentary (seconds) sharp left-sided chest pain that can occur at any time. The pain lasts only for a second or two and goes away.   Labs (8/10): creatinine 1.3, BNP 4  Labs (11/10): creatinine 1.05, K 4.4, LDL 103, HDL 38  Labs (1/11): LDL 89  Labs (2/11): creatinine 1.2  Labs (9/12): K 4.7, creatinine 1.2, LDL 51, HDL 38  ECG: NSR, normal   No Known Drug Allergies   Past Medical History:  1. Depression  2. Hyperlipidemia  3. Obesity  4. Benign prostatic hypertrophy  5. cervical radiculopathy,  1995  6. cough - induced headache, Dr Neale Burly  7. HTN: ACEI cough and probable upper airways instability.  8. CAD: ETT (9/10): 9', 10.4 METS, no ECG changes. Mild chest tightness at peak exercise. LHC (11/10): EF 55%, 50% distal left main, 50% proximal LAD. Patient underwent both IVUS and FFR, both suggested that the left main and LAD stenoses were not hemodynamically significant. Cardiopulmonary exercise test then done in 12/10 to assess for cause of dyspnea. This showed a hypertensive response to exercise and possible diastolic dysfunction. No ECG changes. There was an abrupt decrease in oxygen pulse raising concern for ischemia. Therefore ETT-myoview was done in 12/10. Patient went 8 minutes and stopped due to SOB. No ECG changes and no evidence for ischemia or infarction. Medical management continued.   ETT-myoview (12/11): 8'31", no ECG changes, no evidence for ischemia or infarction.  9. Exertional dyspnea: Pulmonary workup with PFTs (9/10) showing spirometry, DLCO, and lung volumes not significantly abnormal. CTA chest without PE. ACEI changed to ARB with some improvement in symptoms suggesting component of upper airways instability. Right heart catheterization (11/10): normal left and right heart filling pressures, no pulmonary HTN. Cardiopulmonary exercise test as described above.  10. OSA: Using CPAP.  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   Family History:  - father died age 48, liver cancer, lung ca  - mother died at 81, status post CABG, peptic ulcer disease, hypertension, history of abdominal aortic aneurysm. Had MI in her 10s.  -  one sister with obesity, hypertension, hyperlipidemia  - MGM-stomach cancer, abdominal aortic aneurysm  - Uncle with MI   Social History:  retired Hydrographic surveyor for [employer]  Patient states former smoker. 3-4cigars/month x37yrs. quit in 2005.  Married  1 son, 1 step daughter  Lives in [place]   Review of Systems  All  systems reviewed and negative except as per HPI.   Current Outpatient Prescriptions  Medication Sig Dispense Refill  . aspirin 81 MG tablet Take 81 mg by mouth daily.        . Azelastine HCl (ASTEPRO) 0.15 % SOLN Place into the nose.        . calcium carbonate (TUMS - DOSED IN MG ELEMENTAL CALCIUM) 500 MG chewable tablet Chew 1 tablet by mouth as needed.        . carvedilol (COREG) 6.25 MG tablet TAKE 1 TABLET TWICE A DAY  60 tablet  11  . citalopram (CELEXA) 10 MG tablet Take 10 mg by mouth as needed.       Marland Kitchen losartan (COZAAR) 50 MG tablet Take 1 tablet (50 mg total) by mouth daily.  90 tablet  3  . meclizine (ANTIVERT) 12.5 MG tablet Take 1 tablet (12.5 mg total) by mouth 3 (three) times daily as needed for dizziness or nausea.  30 tablet  1  . naproxen sodium (ANAPROX) 550 MG tablet Take 550 mg by mouth as needed.        . Omega-3 Fatty Acids (FISH OIL) 1000 MG CAPS Take 1 capsule by mouth daily.        . rosuvastatin (CRESTOR) 40 MG tablet Take 1 tablet (40 mg total) by mouth daily.  90 tablet  3  . topiramate (TOPAMAX) 200 MG tablet Take 200 mg by mouth daily. 150 mg daily       Current Facility-Administered Medications  Medication Dose Route Frequency Provider Last Rate Last Dose  . TDaP (BOOSTRIX) injection 0.5 mL  0.5 mL Intramuscular Once Hassan Rowan, MD        BP 120/66  Pulse 76  Ht 6\' 3"  (1.905 m)  Wt 125.193 kg (276 lb)  BMI 34.50 kg/m2 General: NAD, obese.  Neck: No JVD, no thyromegaly or thyroid nodule.  Lungs: Clear to auscultation bilaterally with normal respiratory effort. CV: Nondisplaced PMI.  Heart regular S1/S2, no S3/S4, no murmur.  No peripheral edema.  No carotid bruit.  Normal pedal pulses.  Abdomen: Soft, nontender, no hepatosplenomegaly, no distention.  Neurologic: Alert and oriented x 3.  Psych: Normal affect. Extremities: No clubbing or cyanosis.

## 2011-01-15 NOTE — Assessment & Plan Note (Addendum)
Cause is still difficult to ascertain. Possible deconditioning/weight gain, possible diastolic dysfunction. CPX suggested possibility of ischemia but other testing has not been suggestive of ischemia.  Possibly this is residual from his prior lung empyema requiring VATS and decortication.  He needs to continue to work on diet and exercise for weight loss; I suggested that he start regular exercise on a treadmill.

## 2011-01-15 NOTE — Assessment & Plan Note (Signed)
50% left main stenosis on cath but IVUS and FFR suggested that it was not hemodynamically significant. Cardiopulmonary exercise test done to assess for cause of shortness of breath suggested diastolic dysfunction but also suggested possible ischemia. Therefore, ETT-myoview was done in 12/10 showing no ECG changes and no evidence for ischemia or infarction.  ETT-myoview repeated at one year in 12/11 showed again no ischemia or infarction.  I am managing the patient medically at this time. He has mild exertional dyspnea that is stable as well as atypical chest pain.  - Continue statin with goal LDL < 70  - ASA, ARB, beta blocker  - Given left main lesion, I will get an ETT this year to assess for ischemia (no imaging).

## 2011-02-05 ENCOUNTER — Ambulatory Visit: Payer: No Typology Code available for payment source | Admitting: Family Medicine

## 2011-02-22 ENCOUNTER — Ambulatory Visit (INDEPENDENT_AMBULATORY_CARE_PROVIDER_SITE_OTHER): Payer: Medicare Other | Admitting: Cardiology

## 2011-02-22 ENCOUNTER — Encounter: Payer: Self-pay | Admitting: *Deleted

## 2011-02-22 ENCOUNTER — Encounter (HOSPITAL_COMMUNITY): Payer: Self-pay | Admitting: Respiratory Therapy

## 2011-02-22 DIAGNOSIS — R079 Chest pain, unspecified: Secondary | ICD-10-CM | POA: Diagnosis not present

## 2011-02-22 NOTE — Progress Notes (Signed)
Patient ID: Jeremy Woodward, male   DOB: 06/26/1945, 65 y.o.   MRN: [id number]  

## 2011-02-22 NOTE — Patient Instructions (Signed)
Your physician recommends that you return for lab work on March 01, 2011. BMET/CBC/PT   Your physician has requested that you have a cardiac catheterization. Cardiac catheterization is used to diagnose and/or treat various heart conditions. Doctors may recommend this procedure for a number of different reasons. The most common reason is to evaluate chest pain. Chest pain can be a symptom of coronary artery disease (CAD), and cardiac catheterization can show whether plaque is narrowing or blocking your heart's arteries. This procedure is also used to evaluate the valves, as well as measure the blood flow and oxygen levels in different parts of your heart. For further information please visit [email or web address] Please follow instruction sheet, as given. Tuesday February 5,2013  Your physician recommends that you schedule a follow-up appointment with Dr Shirlee Latch  2 weeks after the cath 03/05/11.

## 2011-02-22 NOTE — Progress Notes (Signed)
Exercise Treadmill Test  Pre-Exercise Testing Evaluation Rhythm: normal sinus  Rate: 70   PR:  .15 QRS:  .10  QT:  42 QTc: 46     Test  Exercise Tolerance Test Ordering MD: Marca Ancona, MD  Interpreting MD:  Marca Ancona, MD  Unique Test No: 1  Treadmill:  1  Indication for ETT: chest pain - rule out ischemia  Contraindication to ETT: No   Stress Modality: exercise - treadmill  Cardiac Imaging Performed: non   Protocol: standard Bruce - maximal  Max BP: 206/61  Max MPHR (bpm):  154 85% MPR (bpm):  130  MPHR obtained (bpm):  133 % MPHR obtained:  86%  Reached 85% MPHR (min:sec):  6:20 Total Exercise Time (min-sec):  6:36  Workload in METS:  7.8 Borg Scale: 17  Reason ETT Terminated:  Dyspnea, mild chest tightness.     ST Segment Analysis At Rest: normal ST segments - no evidence of significant ST depression With Exercise: Nonspecific upsloping ST depression.  Other Information Arrhythmia:  PVCs in recovery.   Angina during ETT:  Very mild chest tightness.  Quality of ETT:  diagnostic  ETT Interpretation:  normal - no evidence of ischemia by ST analysis  Comments: No evidence for ischemia by ST analysis but had very profound dyspnea and mild chest pain.  He did not exercise for as long as in the past.  He tells me that his exertional dyspnea has worsened recently.  One week ago, he had 2 days of on and off chest tightness.  Despite only nonspecific ECG changes, I am worried given known moderate left main stenosis, especially with worsening symptoms.  I am going to bring him in for left and right heart cath.

## 2011-03-01 ENCOUNTER — Other Ambulatory Visit (INDEPENDENT_AMBULATORY_CARE_PROVIDER_SITE_OTHER): Payer: Medicare Other | Admitting: *Deleted

## 2011-03-01 DIAGNOSIS — R079 Chest pain, unspecified: Secondary | ICD-10-CM | POA: Diagnosis not present

## 2011-03-01 LAB — CBC WITH DIFFERENTIAL/PLATELET
Basophils Relative: 0.3 % (ref 0.0–3.0)
Eosinophils Absolute: 0.1 10*3/uL (ref 0.0–0.7)
Hemoglobin: 13.7 g/dL (ref 13.0–17.0)
Lymphocytes Relative: 22.3 % (ref 12.0–46.0)
MCHC: 34.4 g/dL (ref 30.0–36.0)
MCV: 90.1 fl (ref 78.0–100.0)
Monocytes Absolute: 0.7 10*3/uL (ref 0.1–1.0)
Neutro Abs: 5.1 10*3/uL (ref 1.4–7.7)
RBC: 4.43 Mil/uL (ref 4.22–5.81)

## 2011-03-01 LAB — BASIC METABOLIC PANEL
BUN: 18 mg/dL (ref 6–23)
Chloride: 109 mEq/L (ref 96–112)
Creatinine, Ser: 1.4 mg/dL (ref 0.4–1.5)

## 2011-03-04 ENCOUNTER — Telehealth: Payer: Self-pay | Admitting: Cardiology

## 2011-03-04 ENCOUNTER — Other Ambulatory Visit: Payer: Self-pay | Admitting: Cardiology

## 2011-03-04 NOTE — Telephone Encounter (Addendum)
Pt calling re being at the hospital wants to confirm time and location where to arrive, also was told he may needs two procedures and worried that medicare may not cover two procedures on the same day? Requesting call before 1p

## 2011-03-04 NOTE — Telephone Encounter (Signed)
LMTCB

## 2011-03-04 NOTE — Telephone Encounter (Signed)
Fu call °Patient returning your call °

## 2011-03-04 NOTE — Telephone Encounter (Signed)
I talked with pt. 

## 2011-03-04 NOTE — Telephone Encounter (Signed)
Pt scheduled for R and LHC 03/05/11 at 7:30am. Pt to arrive at 5:30am. I talked with pt.

## 2011-03-05 ENCOUNTER — Ambulatory Visit (HOSPITAL_COMMUNITY)
Admission: RE | Admit: 2011-03-05 | Discharge: 2011-03-05 | Disposition: A | Discharge status: Home / Self Care | Payer: Medicare Other | Source: Ambulatory Visit | Attending: Cardiology | Admitting: Cardiology

## 2011-03-05 ENCOUNTER — Other Ambulatory Visit: Payer: Self-pay

## 2011-03-05 ENCOUNTER — Encounter (HOSPITAL_COMMUNITY)
Admission: RE | Disposition: A | Discharge status: Home / Self Care | Payer: Self-pay | Source: Ambulatory Visit | Attending: Cardiology

## 2011-03-05 DIAGNOSIS — R0609 Other forms of dyspnea: Secondary | ICD-10-CM | POA: Diagnosis not present

## 2011-03-05 DIAGNOSIS — I2510 Atherosclerotic heart disease of native coronary artery without angina pectoris: Secondary | ICD-10-CM | POA: Diagnosis not present

## 2011-03-05 DIAGNOSIS — R635 Abnormal weight gain: Secondary | ICD-10-CM | POA: Insufficient documentation

## 2011-03-05 DIAGNOSIS — R0989 Other specified symptoms and signs involving the circulatory and respiratory systems: Secondary | ICD-10-CM | POA: Insufficient documentation

## 2011-03-05 HISTORY — PX: LEFT AND RIGHT HEART CATHETERIZATION WITH CORONARY ANGIOGRAM: SHX5449

## 2011-03-05 LAB — POCT I-STAT 3, VENOUS BLOOD GAS (G3P V)
TCO2: 20 mmol/L (ref 0–100)
pCO2, Ven: 36.9 mmHg — ABNORMAL LOW (ref 45.0–50.0)
pH, Ven: 7.312 — ABNORMAL HIGH (ref 7.250–7.300)
pO2, Ven: 40 mmHg (ref 30.0–45.0)

## 2011-03-05 LAB — POCT I-STAT 3, ART BLOOD GAS (G3+)
Bicarbonate: 18.3 mEq/L — ABNORMAL LOW (ref 20.0–24.0)
O2 Saturation: 99 %
TCO2: 19 mmol/L (ref 0–100)
pCO2 arterial: 31.7 mmHg — ABNORMAL LOW (ref 35.0–45.0)
pH, Arterial: 7.37 (ref 7.350–7.450)

## 2011-03-05 SURGERY — LEFT AND RIGHT HEART CATHETERIZATION WITH CORONARY ANGIOGRAM
Anesthesia: Moderate Sedation

## 2011-03-05 SURGERY — LEFT AND RIGHT HEART CATHETERIZATION WITH CORONARY ANGIOGRAM
Anesthesia: LOCAL

## 2011-03-05 MED ORDER — HEPARIN (PORCINE) IN NACL 2-0.9 UNIT/ML-% IJ SOLN
INTRAMUSCULAR | Status: AC
  Filled 2011-03-05: qty 2000

## 2011-03-05 MED ORDER — ACETAMINOPHEN 325 MG PO TABS: 650.0000 mg | ORAL_TABLET | ORAL | Status: DC | PRN

## 2011-03-05 MED ORDER — ASPIRIN 81 MG PO CHEW
324.0000 mg | CHEWABLE_TABLET | ORAL | Status: AC
  Administered 2011-03-05: 324 mg via ORAL
  Filled 2011-03-05: qty 4

## 2011-03-05 MED ORDER — ONDANSETRON HCL 4 MG/2ML IJ SOLN: 4.0000 mg | Freq: Four times a day (QID) | INTRAMUSCULAR | Status: DC | PRN

## 2011-03-05 MED ORDER — SODIUM CHLORIDE 0.9 % IJ SOLN: 3.0000 mL | INTRAMUSCULAR | Status: DC | PRN

## 2011-03-05 MED ORDER — LIDOCAINE HCL (PF) 1 % IJ SOLN
INTRAMUSCULAR | Status: AC
  Filled 2011-03-05: qty 30

## 2011-03-05 MED ORDER — MIDAZOLAM HCL 2 MG/2ML IJ SOLN
INTRAMUSCULAR | Status: AC
  Filled 2011-03-05: qty 2

## 2011-03-05 MED ORDER — SODIUM CHLORIDE 0.9 % IV SOLN
INTRAVENOUS | Status: DC
  Administered 2011-03-05: 06:00:00 via INTRAVENOUS

## 2011-03-05 MED ORDER — SODIUM CHLORIDE 0.9 % IV SOLN: 250.0000 mL | INTRAVENOUS | Status: DC | PRN

## 2011-03-05 MED ORDER — SODIUM CHLORIDE 0.9 % IV SOLN: INTRAVENOUS | Status: AC

## 2011-03-05 MED ORDER — NITROGLYCERIN 0.2 MG/ML ON CALL CATH LAB
INTRAVENOUS | Status: AC
  Filled 2011-03-05: qty 1

## 2011-03-05 MED ORDER — FENTANYL CITRATE 0.05 MG/ML IJ SOLN
INTRAMUSCULAR | Status: AC
  Filled 2011-03-05: qty 2

## 2011-03-05 MED ORDER — SODIUM CHLORIDE 0.9 % IJ SOLN: 3.0000 mL | Freq: Two times a day (BID) | INTRAMUSCULAR | Status: DC

## 2011-03-05 NOTE — H&P (View-Only) (Signed)
Patient ID: Jeremy Woodward, male   DOB: 1945-09-02, 66 y.o.   MRN: [id number]

## 2011-03-05 NOTE — Interval H&P Note (Signed)
History and Physical Interval Note:  03/05/2011 7:53 AM  Jeremy Woodward  has presented today for surgery, with the diagnosis of Dyspnea, chest pain.  The various methods of treatment have been discussed with the patient and family. After consideration of risks, benefits and other options for treatment, the patient has consented to  Procedure(s): LEFT AND RIGHT HEART CATHETERIZATION WITH CORONARY ANGIOGRAM as a surgical intervention .  The patients' history has been reviewed, patient examined, no change in status, stable for surgery.  I have reviewed the patients' chart and labs.  Questions were answered to the patient's satisfaction.     Selicia Windom [employer]

## 2011-03-05 NOTE — Procedures (Signed)
Cardiac Catheterization Procedure Note  Name: Jeremy Woodward MRN: [id number] DOB: Feb 15, 1945  Procedure: Right Heart Cath, Left Heart Cath, Selective Coronary Angiography, LV angiography  Indication:    Procedural Details: The right groin was prepped, draped, and anesthetized with 1% lidocaine. Using the modified Seldinger technique a 5 French sheath was placed in the right femoral artery and a 7 French sheath was placed in the right femoral vein. A Swan-Ganz catheter was used for the right heart catheterization. Standard protocol was followed for recording of right heart pressures and sampling of oxygen saturations. Fick cardiac output was calculated. MP catheter was used for selective coronary angiography and left ventriculography. There were no immediate procedural complications. The patient was transferred to the post catheterization recovery area for further monitoring.  Procedural Findings: Hemodynamics (mmHg) RA 10 RV 29/9 PA 28/15 PCWP 14 LV 133/18 AO 133/74  Oxygen saturations: PA 71% AO 99%  Cardiac Output (Fick) 6.3  Cardiac Index (Fick) 2.6   Coronary angiography: Coronary dominance: right  Left mainstem: 40% distal left main stenosis.   Left anterior descending (LAD): 40-50% proximal stenosis at the take-off of the first diagonal.   Left circumflex (LCx): Minimal luminal irregularities.   Right coronary artery (RCA): Minimal luminal irregularities.   Left ventriculography: Left ventricular systolic function is normal, LVEF is estimated at 55-65%.   Final Conclusions:  I compared current films to 2010.  There has been no significant progression, left main may look a little better.  Filling pressures were not significantly elevated.  I think that [name] exertional dyspnea is due to deconditioning, weight gain, and his pulmonary scarring process.   Recommendations: Continue current medical regimen for CAD.  Needs weight loss.    Marca Ancona 03/05/2011,  8:55 AM

## 2011-03-21 ENCOUNTER — Encounter: Payer: Self-pay | Admitting: Cardiology

## 2011-03-21 ENCOUNTER — Ambulatory Visit (INDEPENDENT_AMBULATORY_CARE_PROVIDER_SITE_OTHER): Payer: Medicare Other | Admitting: Cardiology

## 2011-03-21 VITALS — BP 108/70 | HR 69 | Ht 75.0 in | Wt 273.0 lb

## 2011-03-21 DIAGNOSIS — R0989 Other specified symptoms and signs involving the circulatory and respiratory systems: Secondary | ICD-10-CM | POA: Diagnosis not present

## 2011-03-21 DIAGNOSIS — R0609 Other forms of dyspnea: Secondary | ICD-10-CM

## 2011-03-21 DIAGNOSIS — R079 Chest pain, unspecified: Secondary | ICD-10-CM

## 2011-03-21 DIAGNOSIS — E785 Hyperlipidemia, unspecified: Secondary | ICD-10-CM

## 2011-03-21 NOTE — Patient Instructions (Signed)
Dr Shirlee Latch will refer you to the nutritionist.  Your physician recommends that you return for a FASTING lipid profile /liver profile in March 2013.  Your physician wants you to follow-up in: 6 months with Dr Shirlee Latch. (August 2013).You will receive a reminder letter in the mail two months in advance. If you don't receive a letter, please call our office to schedule the follow-up appointment.

## 2011-03-24 NOTE — Assessment & Plan Note (Signed)
Check lipids/LFTs in 3/13.   Goal LDL < 70.

## 2011-03-24 NOTE — Assessment & Plan Note (Signed)
Recent cath showed stable to improved coronary disease.  I do not think that his exertional dyspnea is related to his coronary disease.  Continue ASA 81, ARB, Coreg, and Crestor.

## 2011-03-24 NOTE — Assessment & Plan Note (Signed)
Chronic exertional dyspnea.  I do not think that this is related to coronary disease.  It most likely is multifactorial, from deconditioning, weight gain, and his chronic pulmonary scarring.  He needs to work on diet and exercise for weight loss.

## 2011-03-24 NOTE — Progress Notes (Signed)
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   I did a left and right heart catheterization in 11/10 given his exertional chest pain and dyspnea. Right heart cath showed normal left and right heart filling pressures and no pulmonary hypertension. Left heart cath showed 50% distal left main stenosis and 50% proximal LAD stenosis. IVUS and FFR were done of both lesions, and it appeared that both were not hemodynamically significant and not likely to be the cause of his symptoms. He had a cardiopulmonary stress test in 12/10 to try to elucidate the cause of his shortness of breath. This was actually suggestive of possible ischemia. Therefore, he did an ETT-myoview in 12/10 as well. He became quite short of breath but exercised 8 minutes with no ECG changes and no evidence for ischemia or infarction. It was decided to continue medical management.  He had an ETT-myoview a year later in 12/11, showing no ischemia or infarction.   Patient reported somewhat increased exertional dyspnea as well as occasional chest pain episodes, sometimes associated with activity but not always.  ETT was done in 1/13, showing 6:20 exercise with discontinuation due to profound dyspnea and some chest pain.  Given very severe symptoms with ETT and progressive symptoms at home, I took him for [facility] and RHC.  RHC showed normal right and left heart filling pressure.  LHC showed stable to improved coronary disease, with 40% distal LM and 40-50% proximal LAD.   Since the cath, he has been trying to work on dieting for weight loss.    Labs (8/10): creatinine 1.3, BNP 4  Labs (11/10): creatinine 1.05, K 4.4, LDL 103, HDL 38  Labs (1/11): LDL 89  Labs (2/11): creatinine 1.2  Labs (9/12): K 4.7, creatinine 1.2, LDL 51, HDL 38  No Known Drug Allergies   Past Medical History:  1. Depression  2. Hyperlipidemia  3. Obesity  4. Benign  prostatic hypertrophy  5. cervical radiculopathy, 1995  6. cough - induced headache, Dr Neale Burly  7. HTN: ACEI cough and probable upper airways instability.  8. CAD: ETT (9/10): 9', 10.4 METS, no ECG changes. Mild chest tightness at peak exercise. LHC (11/10): EF 55%, 50% distal left main, 50% proximal LAD. Patient underwent both IVUS and FFR, both suggested that the left main and LAD stenoses were not hemodynamically significant. Cardiopulmonary exercise test then done in 12/10 to assess for cause of dyspnea. This showed a hypertensive response to exercise and possible diastolic dysfunction. No ECG changes. There was an abrupt decrease in oxygen pulse raising concern for ischemia. Therefore ETT-myoview was done in 12/10. Patient went 8 minutes and stopped due to SOB. No ECG changes and no evidence for ischemia or infarction. Medical management continued.   ETT-myoview (12/11): 8'31", no ECG changes, no evidence for ischemia or infarction.  ETT (1/13) 6:20 with chest pain and dyspnea.  RHC/LHC (2/13) with mean RA 10, PA 28/15, mean PCWP 14, CI 2.6; 40% distal LM, 40-50% proximal LAD, EF 55-60%.   9. Exertional dyspnea: Pulmonary workup with PFTs (9/10) showing spirometry, DLCO, and lung volumes not significantly abnormal. CTA chest without PE. ACEI changed to ARB with some improvement in symptoms suggesting component of upper airways instability. Right heart catheterization (11/10): normal left and right heart filling pressures, no pulmonary HTN. Cardiopulmonary exercise test as described above.  10. OSA: Using CPAP.  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  Family History:  - father died age 80, liver cancer, lung ca  - mother died at 42, status post CABG, peptic ulcer disease, hypertension, history of abdominal aortic aneurysm. Had MI in her 34s.  - one sister with obesity, hypertension, hyperlipidemia  - MGM-stomach cancer, abdominal aortic aneurysm  - Uncle with MI   Social  History:  retired Hydrographic surveyor for [employer]  Patient states former smoker. 3-4cigars/month x50yrs. quit in 2005.  Married  1 son, 1 step daughter  Lives in [place]    Current Outpatient Prescriptions  Medication Sig Dispense Refill  . aspirin 81 MG tablet Take 81 mg by mouth daily.        . calcium carbonate (TUMS - DOSED IN MG ELEMENTAL CALCIUM) 500 MG chewable tablet Chew 1 tablet by mouth as needed.        . carvedilol (COREG) 6.25 MG tablet Take 6.25 mg by mouth 2 (two) times daily with a meal.      . citalopram (CELEXA) 10 MG tablet Take 10 mg by mouth as needed.       Marland Kitchen losartan (COZAAR) 50 MG tablet Take 50 mg by mouth daily.      . meclizine (ANTIVERT) 12.5 MG tablet Take 12.5 mg by mouth 3 (three) times daily as needed. For dizziness      . Multiple Vitamins-Minerals (MULTIVITAMINS THER. W/MINERALS) TABS Take 1 tablet by mouth daily.      . naproxen sodium (ANAPROX) 550 MG tablet Take 550 mg by mouth as needed. For pain      . Omega-3 Fatty Acids (FISH OIL) 1000 MG CAPS Take 1 capsule by mouth daily.        . rosuvastatin (CRESTOR) 40 MG tablet Take 20 mg by mouth daily.      Marland Kitchen topiramate (TOPAMAX) 100 MG tablet Take 150 mg by mouth daily.      . Azelastine HCl (ASTEPRO) 0.15 % SOLN Place into the nose.         Current Facility-Administered Medications  Medication Dose Route Frequency Provider Last Rate Last Dose  . TDaP (BOOSTRIX) injection 0.5 mL  0.5 mL Intramuscular Once Hassan Rowan, MD        BP 108/70  Pulse 69  Ht 6\' 3"  (1.905 m)  Wt 273 lb (123.832 kg)  BMI 34.12 kg/m2 General: NAD, obese.  Neck: No JVD, no thyromegaly or thyroid nodule.  Lungs: Clear to auscultation bilaterally with normal respiratory effort. CV: Nondisplaced PMI.  Heart regular S1/S2, no S3/S4, no murmur.  No peripheral edema.  No carotid bruit.  Normal pedal pulses.  Abdomen: Soft, nontender, no hepatosplenomegaly, no distention.  Neurologic: Alert and oriented x 3.  Psych:  Normal affect. Extremities: No clubbing or cyanosis.

## 2011-04-01 ENCOUNTER — Other Ambulatory Visit (INDEPENDENT_AMBULATORY_CARE_PROVIDER_SITE_OTHER): Payer: Medicare Other

## 2011-04-01 DIAGNOSIS — E785 Hyperlipidemia, unspecified: Secondary | ICD-10-CM

## 2011-04-01 LAB — LIPID PANEL
Cholesterol: 123 mg/dL (ref 0–200)
Triglycerides: 168 mg/dL — ABNORMAL HIGH (ref 0.0–149.0)

## 2011-04-01 LAB — HEPATIC FUNCTION PANEL
ALT: 25 U/L (ref 0–53)
AST: 18 U/L (ref 0–37)
Albumin: 4.2 g/dL (ref 3.5–5.2)

## 2011-04-11 DIAGNOSIS — G4485 Primary stabbing headache: Secondary | ICD-10-CM | POA: Diagnosis not present

## 2011-04-11 DIAGNOSIS — G4730 Sleep apnea, unspecified: Secondary | ICD-10-CM | POA: Diagnosis not present

## 2011-04-11 DIAGNOSIS — G4710 Hypersomnia, unspecified: Secondary | ICD-10-CM | POA: Diagnosis not present

## 2011-04-11 DIAGNOSIS — M542 Cervicalgia: Secondary | ICD-10-CM | POA: Diagnosis not present

## 2011-04-15 ENCOUNTER — Encounter: Payer: Self-pay | Admitting: *Deleted

## 2011-04-15 ENCOUNTER — Encounter: Payer: Medicare Other | Attending: Cardiology | Admitting: *Deleted

## 2011-04-15 VITALS — Ht 75.0 in | Wt 273.2 lb

## 2011-04-15 DIAGNOSIS — Z713 Dietary counseling and surveillance: Secondary | ICD-10-CM | POA: Insufficient documentation

## 2011-04-15 DIAGNOSIS — E669 Obesity, unspecified: Secondary | ICD-10-CM | POA: Diagnosis not present

## 2011-04-15 DIAGNOSIS — E785 Hyperlipidemia, unspecified: Secondary | ICD-10-CM | POA: Insufficient documentation

## 2011-04-15 NOTE — Patient Instructions (Signed)
Plan: Aim for 30 grams carbohydrate per meal, 0-30 grams per snack if hungry Increase activity level in 10 minute increments several times a day as tolerated Read Food Labels for Total Carbohydrate of foods.

## 2011-04-15 NOTE — Progress Notes (Signed)
  Medical Nutrition Therapy:  Appt start time: 0915 end time:  1045.   Assessment:  Primary concerns today: Patient here with his wife for obesity and hyperlipidemia. Wife appears very supportive. 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 He has been physically active off and on throughout his life but is quite nervous about becoming short of breath with mild activity now. He has dieted throughout his life also, but is not seeing any weight loss with the same efforts as he has in the past.   MEDICATIONS: see list   DIETARY INTAKE:  Usual eating pattern includes 3 meals and 1-2 snacks per day.  Everyday foods include good variety of all food groups.  Avoided foods include high carb foods.    24-hr recall:  B ( AM):  2 eggs, 3-4 bacon, coffee black Snk ( AM): none  L ( PM): sandwich with cold cuts, sometimes without bread, water or Crystal Lite lemonade Snk ( PM): couple of cookies, fresh fruit D ( PM): beef, chicken, beans, salad, excessive dressing ([address] [place]), unsweetend iced tea Snk ( PM): occasionally ice cream OR canned fruit Beverages: coffee, water, SF lemonade, unsweetened iced tea  Usual physical activity: occasionally handy work as Agricultural consultant for [employer] energy needs: 1400 calories 158 g carbohydrates 105 g protein 39 g fat  Progress Towards Goal(s):  In progress.   Nutritional Diagnosis:  NI-1.5 Excessive energy intake As related to obesity.  As evidenced by BMI of 34.2%.    Intervention:  Nutrition counseling and hyperlipidemia addressed. Suggest his metabolism is very slow with history of dieting and limited physical activity right now. I suggested he have a small amount of carbohydrate at each meal and increase his activity in very small increments several times a day to start increasing his metabolism without over stressing his body. Plan: Aim for 30 grams carbohydrate  per meal, 0-30 grams per snack if hungry Increase activity level in 10 minute increments several times a day as tolerated Read Food Labels for Total Carbohydrate of foods.  Handouts given during visit include:  Carb counting and Label Reading handouts  Meal Planning Booklet from [employer]  [facility] Eating Booklet  Monitoring/Evaluation:  Dietary intake, exercise , reading food labels, and body weight in 4 week(s).

## 2011-05-08 DIAGNOSIS — M25519 Pain in unspecified shoulder: Secondary | ICD-10-CM | POA: Diagnosis not present

## 2011-05-10 DIAGNOSIS — M542 Cervicalgia: Secondary | ICD-10-CM | POA: Diagnosis not present

## 2011-05-13 ENCOUNTER — Ambulatory Visit: Payer: Medicare Other | Admitting: *Deleted

## 2011-05-14 DIAGNOSIS — M542 Cervicalgia: Secondary | ICD-10-CM | POA: Diagnosis not present

## 2011-05-20 DIAGNOSIS — M542 Cervicalgia: Secondary | ICD-10-CM | POA: Diagnosis not present

## 2011-05-22 DIAGNOSIS — M542 Cervicalgia: Secondary | ICD-10-CM | POA: Diagnosis not present

## 2011-05-23 DIAGNOSIS — M542 Cervicalgia: Secondary | ICD-10-CM | POA: Diagnosis not present

## 2011-05-28 DIAGNOSIS — M542 Cervicalgia: Secondary | ICD-10-CM | POA: Diagnosis not present

## 2011-05-30 DIAGNOSIS — M542 Cervicalgia: Secondary | ICD-10-CM | POA: Diagnosis not present

## 2011-06-03 ENCOUNTER — Other Ambulatory Visit: Payer: Self-pay | Admitting: *Deleted

## 2011-06-03 MED ORDER — CARVEDILOL 6.25 MG PO TABS: 6.2500 mg | ORAL_TABLET | Freq: Two times a day (BID) | ORAL | Status: DC

## 2011-06-11 DIAGNOSIS — G4733 Obstructive sleep apnea (adult) (pediatric): Secondary | ICD-10-CM | POA: Diagnosis not present

## 2011-06-11 DIAGNOSIS — G4485 Primary stabbing headache: Secondary | ICD-10-CM | POA: Diagnosis not present

## 2011-07-01 ENCOUNTER — Other Ambulatory Visit: Payer: Self-pay | Admitting: Cardiology

## 2011-09-12 DIAGNOSIS — G4485 Primary stabbing headache: Secondary | ICD-10-CM | POA: Diagnosis not present

## 2011-09-12 DIAGNOSIS — G4733 Obstructive sleep apnea (adult) (pediatric): Secondary | ICD-10-CM | POA: Diagnosis not present

## 2011-10-15 DIAGNOSIS — G4733 Obstructive sleep apnea (adult) (pediatric): Secondary | ICD-10-CM | POA: Diagnosis not present

## 2011-11-06 ENCOUNTER — Ambulatory Visit (INDEPENDENT_AMBULATORY_CARE_PROVIDER_SITE_OTHER): Payer: Medicare Other | Admitting: Family Medicine

## 2011-11-06 ENCOUNTER — Encounter: Payer: Self-pay | Admitting: Family Medicine

## 2011-11-06 VITALS — BP 131/75 | HR 62 | Ht 75.0 in | Wt 271.0 lb

## 2011-11-06 DIAGNOSIS — R6882 Decreased libido: Secondary | ICD-10-CM

## 2011-11-06 DIAGNOSIS — E291 Testicular hypofunction: Secondary | ICD-10-CM

## 2011-11-06 DIAGNOSIS — I2510 Atherosclerotic heart disease of native coronary artery without angina pectoris: Secondary | ICD-10-CM

## 2011-11-06 DIAGNOSIS — R5381 Other malaise: Secondary | ICD-10-CM

## 2011-11-06 DIAGNOSIS — L570 Actinic keratosis: Secondary | ICD-10-CM | POA: Insufficient documentation

## 2011-11-06 DIAGNOSIS — E785 Hyperlipidemia, unspecified: Secondary | ICD-10-CM

## 2011-11-06 DIAGNOSIS — R5383 Other fatigue: Secondary | ICD-10-CM

## 2011-11-06 DIAGNOSIS — I10 Essential (primary) hypertension: Secondary | ICD-10-CM

## 2011-11-06 DIAGNOSIS — Z23 Encounter for immunization: Secondary | ICD-10-CM

## 2011-11-06 DIAGNOSIS — Z298 Encounter for other specified prophylactic measures: Secondary | ICD-10-CM

## 2011-11-06 DIAGNOSIS — R7989 Other specified abnormal findings of blood chemistry: Secondary | ICD-10-CM

## 2011-11-06 LAB — CBC
Hemoglobin: 14.5 g/dL (ref 13.0–17.0)
MCH: 29.9 pg (ref 26.0–34.0)
MCV: 83.3 fL (ref 78.0–100.0)
RBC: 4.85 MIL/uL (ref 4.22–5.81)

## 2011-11-06 NOTE — Progress Notes (Signed)
CC: Jeremy Woodward is a 66 y.o. male is here for establish with dr. Ivan Anchors and sore spot in left ear    Subjective: HPI:  Patient presents for complete physical exam and would like to discuss a few acute issues.  Due to the extent of the conversation involving the acute issues have asked him to schedule a Medicare wellness exam dedicated to a complete physical exam/preventative services.  He mentions that for the past one to 2 years he's felt more "moody "been normal, he describes himself as always been emotional individual ever since childhood. He describes almost daily situations where he'll be talking to others and he'll get teary-eyed and "choked up" for no particular reason even if he is talking about something that isn't emotional whatsoever. He describes as happening in front of friends, at Sunday school, with his wife, and with strangers. He doesn't seem to be getting better or worse. At one time he was on citalopram but this caused intolerable feelings of apprehension so he quit this over a year ago.  He describes his spirits and mood as "great ", and denies depression nor anxiety, is happy with his relationship at home with his wife and describes himself as a social individual looking forward to going out with others. Hobbies include occasionally bicycling and spending time outside working in his yard. He tells me he gets great satisfaction engage in all hobbies. He denies any sleep disturbances nor any unintentional weight gain or loss nor dietary changes.  He also describes that he's been fatigued and expressed experiencing a low libido with some erectile dysfunction for the last one to 2 years. His testosterone was checked about a year ago and was on the lower limit of normal but there was no intervention. He tells me in the past he was started on testosterone supplementation for a few weeks to months but never got any benefit and the provider stopped prescribing it.  He has no genitourinary  complaints such as testicular swelling nor shrinking nor masses nor architectural changes of the penis or testes.  He has a flaking lesion in his left ear is been present for over a year that comes and goes and when present is described as is a flakiness with mild itching that is easily removed with scratching the lesion. There are times when he feels this not present at all. There have been no prior interventions. There have been no treatments as of yet. He describes an extensive sun exposure history he was a child and has had numerous skin cancer lesions frozen off his back per his report. He denies any swollen lymph nodes of the neck, unintentional weight loss, night sweats, easy bruisability, ear discharge, ear pain, nor trauma to his ear.  He carries a diagnosis of coronary artery disease continues on aspirin, beta blocker, and Crestor. He denies any chest pain, irregular heartbeat, orthopnea, peripheral edema. He does endorse shortness of breath but states this is present and stable for the last one to 2 years, he's been seen by a cardiologist including catheterization a year ago and has seen endocrinologist per record review, ultimately this has always been attributed to his weight  Carries a diagnosis of essential hypertension and is using Coreg at the present time. There are no outside blood pressures to report. He denies motor or sensory disturbances, new or worsening headaches.  Carries a diagnosis of hyperlipidemia currently using Crestor without myalgias, right upper quadrant pain, nor skin or scleral discoloration  Review Of Systems Outlined In HPI  Past Medical History  Diagnosis Date  . Depression   . Hyperlipidemia   . Obesity   . BPH (benign prostatic hypertrophy)   . Hypertension   . CAD (coronary artery disease)   . OSA on CPAP   . Arthritis   . Diarrhea   . CHF (congestive heart failure)      Family History  Problem Relation Age of Onset  . Hyperlipidemia Sister     . Hypertension Sister   . Colon cancer Maternal Grandmother     late 79's when dx   . Cancer Father 62    liver and lung  . Heart disease Mother   . Ulcers Mother   . Hypertension Mother   . Aneurysm Mother      History  Substance Use Topics  . Smoking status: Never Smoker   . Smokeless tobacco: Never Used   Comment: cigars  . Alcohol Use: No     Objective: Filed Vitals:   11/06/11 0839  BP: 131/75  Pulse: 62    General: Alert and Oriented, No Acute Distress HEENT: Pupils equal, round, reactive to light. Conjunctivae clear.  External ears unremarkable, canals clear with intact TMs with appropriate landmarks. Distal portion of the left external canal at approximately 12:00 to 3:00 position there is hyperkeratotic lesion with mild erythema at the base measuring approximately 2 mm x 5 mm and raised no more than 1 mm. Middle ear appears open without effusion. Pink inferior turbinates.  Moist mucous membranes, pharynx without inflammation nor lesions.  Neck supple without palpable lymphadenopathy nor abnormal masses. Lungs: Clear to auscultation bilaterally, no wheezing/ronchi/rales.  Comfortable work of breathing. Good air movement. Cardiac: Regular rate and rhythm. Normal S1/S2.  No murmurs, rubs, nor gallops.  No carotid bruits Extremities: No peripheral edema.  Strong peripheral pulses.  Mental Status: No depression, anxiety, nor agitation.  Becomes tearful when talking about his history of being overly emotional Skin: Warm and dry.  Assessment & Plan: Rhonin was seen today for establish with dr. Ivan Anchors and sore spot in left ear.  Diagnoses and associated orders for this visit:  Cad, native vessel  Essential hypertension, benign  Hyperlipidemia  Low libido - Testosterone, free, total  Fatigue - CBC  Need for prophylactic immunotherapy - Flu vaccine greater than or equal to 3yo with preservative IM  Actinic keratosis  Low serum testosterone level    Coronary  artery disease continuing daily aspirin, Coreg, Crestor. Hypertension is stable with current regimen above. Lipids were last checked in March 2013 with an LDL well below a goal 70, no indication to recheck today. For his emotional instability in the setting of a history of low testosterone, low libido, erectile dysfunction, and no red flags for mental disturbance or depression we'll recheck testosterone and if low with a nonelevated hemoglobin we'll continue discussion of possibly starting titration of testosterone supplementation, he'll need a PSA but I was unable to associated diagnoses of Medicare would cover for at today's visit. I'll arrange followup for this over the phone after results are available. For his fatigue will rule out anemia with CBC as well. I believe he has actinic keratosis within the left ear this was destroyed using a freeze thaw freeze method with a cryotherapy gun.  I've asked him to return in the next 2-4 weeks for a Medicare wellness visit, he was given a flu shot today he is unsure when his last Pneumovax was.  45 minutes spent  in face-to-face visit today of which at least 90% was counseling or coordinating care.  Return in about 4 weeks (around 12/04/2011) for Medicaid Wellness Exam.

## 2011-11-07 ENCOUNTER — Telehealth: Payer: Self-pay | Admitting: Family Medicine

## 2011-11-07 LAB — TESTOSTERONE, FREE, TOTAL, SHBG: Testosterone-% Free: 1.6 % (ref 1.6–2.9)

## 2011-11-07 NOTE — Telephone Encounter (Signed)
Patient leaning towards depo-testosterone, will continue conversation at [facility] next week.

## 2011-11-14 ENCOUNTER — Ambulatory Visit (INDEPENDENT_AMBULATORY_CARE_PROVIDER_SITE_OTHER): Payer: Medicare Other | Admitting: Family Medicine

## 2011-11-14 ENCOUNTER — Encounter: Payer: Self-pay | Admitting: Family Medicine

## 2011-11-14 ENCOUNTER — Telehealth: Payer: Self-pay | Admitting: Family Medicine

## 2011-11-14 VITALS — BP 133/75 | HR 66 | Ht 75.0 in | Wt 274.0 lb

## 2011-11-14 DIAGNOSIS — H9190 Unspecified hearing loss, unspecified ear: Secondary | ICD-10-CM

## 2011-11-14 DIAGNOSIS — G4485 Primary stabbing headache: Secondary | ICD-10-CM | POA: Diagnosis not present

## 2011-11-14 DIAGNOSIS — Z125 Encounter for screening for malignant neoplasm of prostate: Secondary | ICD-10-CM

## 2011-11-14 DIAGNOSIS — R7989 Other specified abnormal findings of blood chemistry: Secondary | ICD-10-CM

## 2011-11-14 DIAGNOSIS — Z23 Encounter for immunization: Secondary | ICD-10-CM | POA: Diagnosis not present

## 2011-11-14 DIAGNOSIS — H918X9 Other specified hearing loss, unspecified ear: Secondary | ICD-10-CM

## 2011-11-14 DIAGNOSIS — E291 Testicular hypofunction: Secondary | ICD-10-CM

## 2011-11-14 DIAGNOSIS — Z0000 Encounter for general adult medical examination without abnormal findings: Secondary | ICD-10-CM

## 2011-11-14 DIAGNOSIS — Z131 Encounter for screening for diabetes mellitus: Secondary | ICD-10-CM | POA: Diagnosis not present

## 2011-11-14 DIAGNOSIS — E669 Obesity, unspecified: Secondary | ICD-10-CM | POA: Diagnosis not present

## 2011-11-14 DIAGNOSIS — G4733 Obstructive sleep apnea (adult) (pediatric): Secondary | ICD-10-CM | POA: Diagnosis not present

## 2011-11-14 MED ORDER — TESTOSTERONE CYPIONATE 200 MG/ML IM SOLN: 300.0000 mg | INTRAMUSCULAR | Status: DC

## 2011-11-14 NOTE — Progress Notes (Signed)
Subjective:    Jeremy Woodward is a 66 y.o. male who presents for Medicare Annual/Subsequent preventive examination.   Preventive Screening-Counseling & Management  Tobacco History  Smoking status  . Never Smoker   Smokeless tobacco  . Never Used  Comment: cigars   Colonoscopy: Last Year not due until 2017 Prostate: Discussed screening risks/beneifts with patient on 11/14/2011.  He's about to start testosterone and agrees he would like to know his PSA declines DRE.  AAA Screen: No history of smoking per patient, not indicated  DEXA: No indication  Influenza Vaccine: He received this last week Pneumovax: He will receive this 11/14/2011\ Td/Tdap: Up-to-date Zoster: Here he received this   Current Problems (verified) Patient Active Problem List  Diagnosis  . HYPOGONADISM  . HYPERLIPIDEMIA  . EXOGENOUS OBESITY  . DEPRESSION  . ESSENTIAL HYPERTENSION, BENIGN  . CAD, NATIVE VESSEL  . BENIGN PROSTATIC HYPERTROPHY  . ADHESIVE CAPSULITIS OF SHOULDER  . Headache  . DYSPNEA ON EXERTION  . CHEST PAIN UNSPECIFIED  . OSA on CPAP  . Nonspecific abnormal finding in stool contents  . Low libido  . Fatigue  . Actinic keratosis  . Low serum testosterone level  . High frequency hearing loss  . Obesity    Medications Prior to Visit Current Outpatient Prescriptions on File Prior to Visit  Medication Sig Dispense Refill  . aspirin 81 MG tablet Take 81 mg by mouth daily.        . Azelastine HCl (ASTEPRO) 0.15 % SOLN Place into the nose.        . calcium carbonate (TUMS - DOSED IN MG ELEMENTAL CALCIUM) 500 MG chewable tablet Chew 1 tablet by mouth as needed.        . carvedilol (COREG) 6.25 MG tablet Take 1 tablet (6.25 mg total) by mouth 2 (two) times daily with a meal.  180 tablet  5  . citalopram (CELEXA) 10 MG tablet Take 10 mg by mouth as needed.       Marland Kitchen losartan (COZAAR) 50 MG tablet Take 50 mg by mouth daily.      . meclizine (ANTIVERT) 12.5 MG tablet Take 12.5 mg by mouth  3 (three) times daily as needed. For dizziness      . Multiple Vitamins-Minerals (MULTIVITAMINS THER. W/MINERALS) TABS Take 1 tablet by mouth daily.      . naproxen sodium (ANAPROX) 550 MG tablet Take 550 mg by mouth as needed. For pain      . Omega-3 Fatty Acids (FISH OIL) 1000 MG CAPS Take 1 capsule by mouth daily.        . rosuvastatin (CRESTOR) 40 MG tablet Take 20 mg by mouth daily.      Marland Kitchen topiramate (TOPAMAX) 100 MG tablet Take 50 mg by mouth daily.         Current Medications (verified) Current Outpatient Prescriptions  Medication Sig Dispense Refill  . aspirin 81 MG tablet Take 81 mg by mouth daily.        . Azelastine HCl (ASTEPRO) 0.15 % SOLN Place into the nose.        . calcium carbonate (TUMS - DOSED IN MG ELEMENTAL CALCIUM) 500 MG chewable tablet Chew 1 tablet by mouth as needed.        . carvedilol (COREG) 6.25 MG tablet Take 1 tablet (6.25 mg total) by mouth 2 (two) times daily with a meal.  180 tablet  5  . citalopram (CELEXA) 10 MG tablet Take 10 mg by mouth as needed.       Marland Kitchen  losartan (COZAAR) 50 MG tablet Take 50 mg by mouth daily.      . meclizine (ANTIVERT) 12.5 MG tablet Take 12.5 mg by mouth 3 (three) times daily as needed. For dizziness      . Multiple Vitamins-Minerals (MULTIVITAMINS THER. W/MINERALS) TABS Take 1 tablet by mouth daily.      . naproxen sodium (ANAPROX) 550 MG tablet Take 550 mg by mouth as needed. For pain      . Omega-3 Fatty Acids (FISH OIL) 1000 MG CAPS Take 1 capsule by mouth daily.        . rosuvastatin (CRESTOR) 40 MG tablet Take 20 mg by mouth daily.      Marland Kitchen topiramate (TOPAMAX) 100 MG tablet Take 50 mg by mouth daily.          Allergies (verified) Review of patient's allergies indicates no known allergies.   PAST HISTORY  Family History Family History  Problem Relation Age of Onset  . Hyperlipidemia Sister   . Hypertension Sister   . Colon cancer Maternal Grandmother     late 61's when dx   . Cancer Father 75    liver and lung  .  Heart disease Mother   . Ulcers Mother   . Hypertension Mother   . Aneurysm Mother     Social History History  Substance Use Topics  . Smoking status: Never Smoker   . Smokeless tobacco: Never Used   Comment: cigars  . Alcohol Use: No    Are there smokers in your home (other than you)?  No  Risk Factors Current exercise habits: The patient does not participate in regular exercise at present.  Dietary issues discussed: Focusing on a low saturated fat diet, low-cholesterol diet, low complex carbohydrate diet in order to minimize triglyceride levels  Cardiac risk factors: advanced age (older than 64 for men, 9 for women), dyslipidemia, hypertension, male gender and obesity (BMI >= 30 kg/m2).  Depression Screen (Note: if answer to either of the following is "Yes", a more complete depression screening is indicated)   Q1: Over the past two weeks, have you felt down, depressed or hopeless? No  Q2: Over the past two weeks, have you felt little interest or pleasure in doing things? No  Have you lost interest or pleasure in daily life? No  Do you often feel hopeless? No  Do you cry easily over simple problems? Yes  Activities of Daily Living In your present state of health, do you have any difficulty performing the following activities?:  Driving? No Managing money?  No Feeding yourself? No Getting from bed to chair? No Climbing a flight of stairs? Yes Preparing food and eating?: No Bathing or showering? No Getting dressed: No Getting to the toilet? No Using the toilet:No Moving around from place to place: No In the past year have you fallen or had a near fall?:No   Are you sexually active?  Yes  Do you have more than one partner?  No  Hearing Difficulties: Yes Do you often ask people to speak up or repeat themselves? Yes Do you experience ringing or noises in your ears? Yes Do you have difficulty understanding soft or whispered voices? Yes   Do you feel that you have a  problem with memory? No  Do you often misplace items? No  Do you feel safe at home?  Yes  Cognitive Testing  Alert? Yes  Normal Appearance?Yes  Oriented to person? Yes  Place? Yes   Time? Yes  Recall  of three objects?  Yes  Can perform simple calculations? Yes  Displays appropriate judgment?Yes  Can read the correct time from a watch face?Yes Clock draw test no abnormalities.    Advanced Directives have been discussed with the patient? Yes. Patient does not currently have advanced directives, we discussed the importance of having these in place and he will bring up the topic with his wife in the next week or 2.  He was offered a handout printed advanced directives [employer] however he plans on finding someone himself.   List the Names of Other Physician/Practitioners you currently use: Dr Sharene Skeans Dr. Benard Halsted any recent Medical Services you may have received from other than [facility] providers in the past year (date may be approximate).  Immunization History  Administered Date(s) Administered  . Influenza Split 10/11/2010, 11/06/2011  . Influenza Whole 12/01/2008, 12/11/2009  . Pneumococcal Polysaccharide 11/14/2011  . Td 12/01/2008  . Tdap 10/11/2010  . Zoster 10/11/2010    Screening Tests Health Maintenance  Topic Date Due  . Pneumococcal Polysaccharide Vaccine Age 33 And Over  06/28/2009  . Influenza Vaccine  09/28/2012  . Colonoscopy  11/05/2015  . Tetanus/tdap  10/10/2020  . Zostavax  Completed    All answers were reviewed with the patient and necessary referrals were made:  Laren Boom, DO   11/14/2011   History reviewed: allergies, current medications, past family history, past medical history, past social history, past surgical history and problem list  Review of Systems Review of Systems - General ROS: negative for - chills, fever, night sweats, weight gain or weight loss Ophthalmic ROS: negative for - decreased vision Psychological ROS:  negative for - anxiety or depression ENT ROS: negative for -nasal congestion,or allergies Hematological and Lymphatic ROS: negative for - bleeding problems, bruising or swollen lymph nodes Breast ROS: negative Respiratory ROS: no cough, shortness of breath, or wheezing Cardiovascular ROS: no chest pain or dyspnea on exertion Gastrointestinal ROS: no abdominal pain, change in bowel habits, or black or bloody stools Genito-Urinary ROS: negative for - genital discharge, genital ulcers, incontinence or abnormal bleeding from genitals Musculoskeletal ROS: negative for - joint pain or muscle pain Neurological ROS: negative for - headaches or memory loss Dermatological ROS: negative for lumps, mole changes, rash and skin lesion changes   Objective:     Vision by Snellen chart: right eye:20/20, left eye:20/20 Blood pressure 133/75, pulse 66, height 6\' 3"  (1.905 m), weight 274 lb (124.286 kg), SpO2 98.00%. Body mass index is 34.25 kg/(m^2).  General: No Acute Distress HEENT: Atraumatic, normocephalic, conjunctivae normal without scleral icterus.  No nasal discharge, hearing grossly intact, TMs with good landmarks bilaterally with no middle ear abnormalities, posterior pharynx clear without oral lesions. Neck: Supple, trachea midline, no cervical nor supraclavicular adenopathy. Pulmonary: Clear to auscultation bilaterally without wheezing, rhonchi, nor rales. Cardiac: Regular rate and rhythm.  No murmurs, rubs, nor gallops. No peripheral edema.  2+ peripheral pulses bilaterally. Abdomen: Bowel sounds normal.  No masses.  Non-tender without rebound.  Negative Murphy's sign. GU: Bilateral descended testes, no evidence of inguinal hernia. MSK: Grossly intact, no signs of weakness.  Full strength throughout upper and lower extremities.  Full ROM in upper and lower extremities.  No midline spinal tenderness. Neuro: Gait unremarkable, CN II-XII grossly intact.  C5-C6 Reflex 2/4 Bilaterally, L4 Reflex 2/4  Bilaterally.  Cerebellar function intact. Skin: No rashes. Psych: Alert and oriented to person/place/time.  Thought process normal. No anxiety/depression.  Assessment:     Subjective hearing loss, low testosterone, need for advanced directives     Plan:     During the course of the visit the patient was educated and counseled about appropriate screening and preventive services including:    He will be getting a pneumococcal vaccine, he is not due for cholesterol all as it was checked in February and was favorable, he declines a hearing test citing that he would not want hearing aids even if found to be indicated, he is up-to-date on colonoscopy, PSA obtained today, we'll screen for diabetes with basic metabolic panel which we'll check GFR in renal function since he is on an angiotensin receptor blocker.  Diet review for nutrition referral?   Not Indicated ____   Patient Instructions (the written plan) was given to the patient.  Medicare Attestation I have personally reviewed: The patient's medical and social history Their use of alcohol, tobacco or illicit drugs Their current medications and supplements The patient's functional ability including ADLs,fall risks, home safety risks, cognitive, and hearing and visual impairment Diet and physical activities Evidence for depression or mood disorders  The patient's weight, height, BMI, and visual acuity have been recorded in the chart.  I have made referrals, counseling, and provided education to the patient based on review of the above and I have provided the patient with a written personalized care plan for preventive services.     Laren Boom, DO   11/14/2011

## 2011-11-14 NOTE — Patient Instructions (Addendum)
You'll need testosterone injections during a "nurse visit" every 3 weeks, then a recheck with me during a visit in approx 3-4 months.   Dr. Genelle Bal General Advice Following Your Complete Physical Exam  The Benefits of Regular Exercise: Unless you suffer from an uncontrolled cardiovascular condition, studies strongly suggest that regular exercise and physical activity will add to both the quality and length of your life.  The World Health Organization recommends 150 minutes of moderate intensity aerobic activity every week.  This is best split over 3-4 days a week, and can be as simple as a brisk walk for just over 35 minutes "most days of the week".  This type of exercise has been shown to lower LDL-Cholesterol, lower average blood sugars, lower blood pressure, lower cardiovascular disease risk, improve memory, and increase one's overall sense of wellbeing.  The addition of anaerobic (or "strength training") exercises offers additional benefits including but not limited to increased metabolism, prevention of osteoporosis, and improved overall cholesterol levels.  How Can I Strive For A Low-Fat Diet?: Current guidelines recommend that 25-35 percent of your daily energy (food) intake should come from fats.  One might ask how can this be achieved without having to dissect each meal on a daily basis?  Switch to skim or 1% milk instead of whole milk.  Focus on lean meats such as ground [place], fresh fish, baked chicken, and lean cuts of beef as your source of dietary protein.  Consume less than 300mg /day of dietary cholesterol.  Limit trans fatty acid consumption primarily by limiting synthetic trans fats such as partially hydrogenated oils (Ex: fried fast foods).  Focus efforts on reducing your intake of "solid" fats (Ex: Butter).  Substitute olive or vegetable oil for solid fats where possible.  Moderation of Salt Intake: Provided you don't carry a diagnosis of congestive heart failure nor renal  failure, I recommend a daily allowance of no more than 2300 mg of salt (sodium).  Keeping under this daily goal is associated with a decreased risk of cardiovascular events, creeping above it can lead to elevated blood pressures and increases your risk of cardiovascular events.  Milligrams (mg) of salt is listed on all nutrition labels, and your daily intake can add up faster than you think.  Most canned and frozen dinners can pack in over half your daily salt allowance in one meal.    Lifestyle Health Risks: Certain lifestyle choices carry specific health risks.  As you may already know, tobacco use has been associated with increasing one's risk of cardiovascular disease, pulmonary disease, numerous cancers, among many other issues.  What you may not know is that there are medications and nicotine replacement strategies that can more than double your chances of successfully quitting.  I would be thrilled to help manage your quitting strategy if you currently use tobacco products.  When it comes to alcohol use, I've yet to find an "ideal" daily allowance.  Provided an individual does not have a medical condition that is exacerbated by alcohol consumption, general guidelines determine "safe drinking" as no more than two standard drinks for a man or no more than one standard drink for a male per day.  However, much debate still exists on whether any amount of alcohol consumption is technically "safe".  My general advice, keep alcohol consumption to a minimum for general health promotion.  If you or others believe that alcohol, tobacco, or recreational drug use is interfering with your life, I would be happy to provide confidential counseling  regarding treatment options.  General "Over The Counter" Nutrition Advice: Postmenopausal women should aim for a daily calcium intake of 1200 mg, however a significant portion of this might already be provided by diets including milk, yogurt, cheese, and other dairy  products.  Vitamin D has been shown to help preserve bone density, prevent fatigue, and has even been shown to help reduce falls in the elderly.  Ensuring a daily intake of 800 Units of Vitamin D is a good place to start to enjoy the above benefits, we can easily check your Vitamin D level to see if you'd potentially benefit from supplementation beyond 800 Units a day.  Folic Acid intake should be of particular concern to women of childbearing age.  Daily consumption of 400-800 mcg of Folic Acid is recommended to minimize the chance of spinal cord defects in a fetus should pregnancy occur.    For many adults, accidents still remain one of the most common culprits when it comes to cause of death.  Some of the simplest but most effective preventitive habits you can adopt include regular seatbelt use, proper helmet use, securing firearms, and regularly testing your smoke and carbon monoxide detectors.  Shandie Bertz [facility] DO Med [facility] [address] [place], [place] [address] Phone: [phone]

## 2011-11-14 NOTE — Telephone Encounter (Signed)
Orders for Q3 weeks depot-testosterone placed in encounter from Thursday

## 2011-11-14 NOTE — Addendum Note (Signed)
Addended by: Laren Boom on: 11/14/2011 10:53 AM   Modules accepted: Orders

## 2011-11-15 LAB — BASIC METABOLIC PANEL WITH GFR
Chloride: 108 mEq/L (ref 96–112)
GFR, Est Non African American: 68 mL/min
Potassium: 4.8 mEq/L (ref 3.5–5.3)
Sodium: 139 mEq/L (ref 135–145)

## 2011-11-15 LAB — PSA, MEDICARE: PSA: 1.24 ng/mL (ref <=4.00)

## 2011-11-18 ENCOUNTER — Ambulatory Visit (INDEPENDENT_AMBULATORY_CARE_PROVIDER_SITE_OTHER): Payer: Medicare Other | Admitting: Family Medicine

## 2011-11-18 ENCOUNTER — Ambulatory Visit: Payer: Medicare Other

## 2011-11-18 VITALS — BP 105/61 | HR 80

## 2011-11-18 DIAGNOSIS — E291 Testicular hypofunction: Secondary | ICD-10-CM | POA: Diagnosis not present

## 2011-11-18 MED ORDER — TESTOSTERONE CYPIONATE 200 MG/ML IM SOLN: 300.0000 mg | INTRAMUSCULAR | Status: DC

## 2011-11-18 MED ORDER — TESTOSTERONE CYPIONATE 200 MG/ML IM SOLN
300.0000 mg | INTRAMUSCULAR | Status: DC
  Administered 2011-11-18: 300 mg via INTRAMUSCULAR

## 2011-11-18 NOTE — Addendum Note (Signed)
Addended by: Laren Boom on: 11/18/2011 01:39 PM   Modules accepted: Orders

## 2011-11-18 NOTE — Progress Notes (Signed)
He'll return in 3 weeks

## 2011-12-03 ENCOUNTER — Ambulatory Visit (INDEPENDENT_AMBULATORY_CARE_PROVIDER_SITE_OTHER): Payer: Medicare Other

## 2011-12-03 ENCOUNTER — Ambulatory Visit (INDEPENDENT_AMBULATORY_CARE_PROVIDER_SITE_OTHER): Payer: Medicare Other | Admitting: Family Medicine

## 2011-12-03 ENCOUNTER — Encounter: Payer: Self-pay | Admitting: Family Medicine

## 2011-12-03 VITALS — BP 137/71 | HR 73 | Temp 98.3°F | Wt 274.0 lb

## 2011-12-03 DIAGNOSIS — R0602 Shortness of breath: Secondary | ICD-10-CM

## 2011-12-03 NOTE — Progress Notes (Signed)
CC: Jeremy Woodward is a 66 y.o. male is here for sore chest and Shortness of Breath   Subjective: HPI:  Patient reports shortness of breath that occurred Thursday the day after doing heavy yard work where he was lifting logs in his backyard. Shortness of breath is only present when a tightness and soreness is present on the lateral and posterior aspects of both lower rib cages. This discomfort can be brought on with deep breathing but more so with rotational and bending forward movements. His discomfort is completely relieved if he leans forward with his hands on the table, shortness of breath relieved in this position as well. On Thursday and Friday he had what sounds to be orthopnea but this is now resolved he is sleeping on his side without difficulty breathing. He has a pulse oximeter at home and states that the readings have been 98% consistently. He looks himself in the mirror and believes he can see a rib sticking out on the left. Has a interesting history of what sounds to have been a empyema on the right lung that required ICU stay for half a week many years ago. He is worried because the pain feels identical to that episode. He denies fevers, chills, cough, confusion, lightheadedness, motor or sensory disturbances, trauma, back pain, abdominal pain, rapid heartbeat, nor peripheral swelling, specifically denies an individual appendage swelling or having discoloration.   Review Of Systems Outlined In HPI  Past Medical History  Diagnosis Date  . Depression   . Hyperlipidemia   . Obesity   . BPH (benign prostatic hypertrophy)   . Hypertension   . CAD (coronary artery disease)   . OSA on CPAP   . Arthritis   . Diarrhea   . CHF (congestive heart failure)      Family History  Problem Relation Age of Onset  . Hyperlipidemia Sister   . Hypertension Sister   . Colon cancer Maternal Grandmother     late 51's when dx   . Cancer Father 46    liver and lung  . Heart disease Mother   .  Ulcers Mother   . Hypertension Mother   . Aneurysm Mother      History  Substance Use Topics  . Smoking status: Never Smoker   . Smokeless tobacco: Never Used     Comment: cigars  . Alcohol Use: No     Objective: Filed Vitals:   12/03/11 1003  BP:   Pulse:   Temp: 98.3 F (36.8 C)    General: Alert and Oriented, No Acute Distress HEENT: Pupils equal, round, reactive to light. Conjunctivae clear.  External ears unremarkable, canals clear with intact TMs with appropriate landmarks.  Middle ear appears open without effusion. Pink inferior turbinates.  Moist mucous membranes, pharynx without inflammation nor lesions.  Neck supple without palpable lymphadenopathy nor abnormal masses. Lungs: Clear to auscultation bilaterally, no wheezing/ronchi/rales.  Comfortable work of breathing. Good air movement. Cardiac: Regular rate and rhythm. Normal S1/S2.  No murmurs, rubs, nor gallops.   Abdomen: Normal bowel sounds, soft and non tender without palpable masses. Left 8th rib is slightly more lateral and anterior than neighboring ribs but is not tender to mainpulation Extremities: No peripheral edema.  Strong peripheral pulses.  Mental Status: No depression, anxiety, nor agitation. Skin: Warm and dry.  Assessment & Plan: [name] was seen today for sore chest and shortness of breath.  Diagnoses and associated orders for this visit:  Shortness of breath - DG Chest 2  View; Future - CBC - B Nat Peptide    Without tachycardia have a very low suspicion for PE, his sats up fantastic and did not drop while walking. We'll get a chest x-ray, CBC to recheck hemoglobin, BNP to rule out heart failure. I suspect he may have strained his rib cage and shoulder all which may be causing him to subconscious alter his breathing pattern. We can consider formal PFTs if the above studies are unremarkable he does not improve after a week of rest. Return if symptoms worsen or fail to  improve.

## 2011-12-04 LAB — CBC
HCT: 42.5 % (ref 39.0–52.0)
Hemoglobin: 14.9 g/dL (ref 13.0–17.0)
MCHC: 35.1 g/dL (ref 30.0–36.0)

## 2011-12-09 ENCOUNTER — Ambulatory Visit (INDEPENDENT_AMBULATORY_CARE_PROVIDER_SITE_OTHER): Payer: Medicare Other | Admitting: Family Medicine

## 2011-12-09 VITALS — BP 122/70 | HR 67 | Temp 98.1°F | Wt 277.0 lb

## 2011-12-09 DIAGNOSIS — E291 Testicular hypofunction: Secondary | ICD-10-CM

## 2011-12-09 MED ORDER — TESTOSTERONE CYPIONATE 200 MG/ML IM SOLN
300.0000 mg | Freq: Once | INTRAMUSCULAR | Status: AC
  Administered 2011-12-09: 300 mg via INTRAMUSCULAR

## 2011-12-09 NOTE — Progress Notes (Signed)
  Subjective:    Patient ID: Jeremy Woodward, male    DOB: 1945-07-19, 66 y.o.   MRN: [id number] Patient is here for testosterone injection. Vitals taken, all WNL. Spent 5 minutes with patient.     HPI    Review of Systems     Objective:   Physical Exam        Assessment & Plan:

## 2011-12-09 NOTE — Progress Notes (Signed)
Tolerated without complications 

## 2011-12-30 ENCOUNTER — Ambulatory Visit (INDEPENDENT_AMBULATORY_CARE_PROVIDER_SITE_OTHER): Payer: Medicare Other | Admitting: Family Medicine

## 2011-12-30 VITALS — BP 127/71 | HR 62

## 2011-12-30 DIAGNOSIS — E291 Testicular hypofunction: Secondary | ICD-10-CM | POA: Diagnosis not present

## 2011-12-30 MED ORDER — TESTOSTERONE CYPIONATE 200 MG/ML IM SOLN
300.0000 mg | Freq: Once | INTRAMUSCULAR | Status: AC
  Administered 2011-12-30: 300 mg via INTRAMUSCULAR

## 2011-12-30 NOTE — Progress Notes (Signed)
Tolerated without complications 

## 2011-12-30 NOTE — Progress Notes (Signed)
  Subjective:    Patient ID: Jeremy Woodward, male    DOB: 10/02/1945, 66 y.o.   MRN: [id number]  HPI   Here for testosterone injection Review of Systems     Objective:   Physical Exam        Assessment & Plan:   

## 2012-01-15 ENCOUNTER — Encounter: Payer: Self-pay | Admitting: Cardiology

## 2012-01-15 ENCOUNTER — Ambulatory Visit (INDEPENDENT_AMBULATORY_CARE_PROVIDER_SITE_OTHER): Payer: Medicare Other | Admitting: Cardiology

## 2012-01-15 VITALS — BP 139/84 | HR 67 | Resp 18 | Ht 74.0 in | Wt 283.0 lb

## 2012-01-15 DIAGNOSIS — R0989 Other specified symptoms and signs involving the circulatory and respiratory systems: Secondary | ICD-10-CM

## 2012-01-15 DIAGNOSIS — I2510 Atherosclerotic heart disease of native coronary artery without angina pectoris: Secondary | ICD-10-CM | POA: Diagnosis not present

## 2012-01-15 DIAGNOSIS — E785 Hyperlipidemia, unspecified: Secondary | ICD-10-CM

## 2012-01-15 DIAGNOSIS — R0609 Other forms of dyspnea: Secondary | ICD-10-CM | POA: Diagnosis not present

## 2012-01-15 LAB — LIPID PANEL
LDL Cholesterol: 45 mg/dL (ref 0–99)
Total CHOL/HDL Ratio: 3

## 2012-01-15 NOTE — Progress Notes (Signed)
Patient ID: Jeremy Woodward, male   DOB: 06/06/1945, 66 y.o.   MRN: [id number] PCP: Dr. Theola Sequin Ardizzone is a 66 yo with history of empyema and VATS/decortication, HTN, CAD, and long-standing exertional shortness of breath.   I did a left and right heart catheterization in 11/10 given his exertional chest pain and dyspnea. Right heart cath showed normal left and right heart filling pressures and no pulmonary hypertension. Left heart cath showed 50% distal left main stenosis and 50% proximal LAD stenosis. IVUS and FFR were done of both lesions, and it appeared that both were not hemodynamically significant and not likely to be the cause of his symptoms. He had a cardiopulmonary stress test in 12/10 to try to elucidate the cause of his shortness of breath. This was actually suggestive of possible ischemia. Therefore, he did an ETT-myoview in 12/10 as well. He became quite short of breath but exercised 8 minutes with no ECG changes and no evidence for ischemia or infarction. It was decided to continue medical management.  He had an ETT-myoview a year later in 12/11, showing no ischemia or infarction.  Patient reported somewhat increased exertional dyspnea as well as occasional chest pain episodes, so  ETT was done in 1/13, showing 6:20 exercise with discontinuation due to profound dyspnea and some chest pain.  Given very severe symptoms with ETT and progressive symptoms at home, I took him again for [facility] and RHC.  RHC showed normal right and left heart filling pressure.  LHC showed stable to improved coronary disease, with 40% distal LM and 40-50% proximal LAD.    Since I last saw him, symptoms have been stable.  He works out on an elliptical 1-2 times a week and is short of breath after about 10 minutes.  He is short of breath with moderate to heavy exertion such as carrying a load or walking up > 1 flight of steps.  He has some atypical fleeting chest pain that tends to occur at rest periodically.  No  exertional chest pain.  Since last appointment, weight is up 10 lbs.  .    Labs (8/10): creatinine 1.3, BNP 4  Labs (11/10): creatinine 1.05, K 4.4, LDL 103, HDL 38  Labs (1/11): LDL 89  Labs (2/11): creatinine 1.2  Labs (9/12): K 4.7, creatinine 1.2, LDL 51, HDL 38 Labs (3/13): LDL 50, HDL 39 Labs (11/13): BNP 16  ECG: NSR, iRBBB  No Known Drug Allergies   Past Medical History:  1. Depression  2. Hyperlipidemia  3. Obesity  4. Benign prostatic hypertrophy  5. cervical radiculopathy, 1995  6. cough - induced headache, Dr Neale Burly  7. HTN: ACEI cough and probable upper airways instability.  8. CAD: ETT (9/10): 9', 10.4 METS, no ECG changes. Mild chest tightness at peak exercise. LHC (11/10): EF 55%, 50% distal left main, 50% proximal LAD. Patient underwent both IVUS and FFR, both suggested that the left main and LAD stenoses were not hemodynamically significant. Cardiopulmonary exercise test then done in 12/10 to assess for cause of dyspnea. This showed a hypertensive response to exercise and possible diastolic dysfunction. No ECG changes. There was an abrupt decrease in oxygen pulse raising concern for ischemia. Therefore ETT-myoview was done in 12/10. Patient went 8 minutes and stopped due to SOB. No ECG changes and no evidence for ischemia or infarction. Medical management continued.   ETT-myoview (12/11): 8'31", no ECG changes, no evidence for ischemia or infarction.  ETT (1/13) 6:20 with chest pain and dyspnea.  RHC/LHC (2/13) with mean RA 10, PA 28/15, mean PCWP 14, CI 2.6; 40% distal LM, 40-50% proximal LAD, EF 55-60%.   9. Exertional dyspnea: Pulmonary workup with PFTs (9/10) showing spirometry, DLCO, and lung volumes not significantly abnormal. CTA chest without PE. ACEI changed to ARB with some improvement in symptoms suggesting component of upper airways instability. Right heart catheterization (11/10): normal left and right heart filling pressures, no pulmonary HTN. Cardiopulmonary  exercise test as described above. RHC in 2/13 as described above.  10. OSA: Using CPAP.  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  12. Low testosterone  Family History:  - father died age 62, liver cancer, lung ca  - mother died at 76, status post CABG, peptic ulcer disease, hypertension, history of abdominal aortic aneurysm. Had MI in her 40s.  - one sister with obesity, hypertension, hyperlipidemia  - MGM-stomach cancer, abdominal aortic aneurysm  - Uncle with MI   Social History:  retired Hydrographic surveyor for [employer]  Patient states former smoker. 3-4cigars/month x30yrs. quit in 2005.  Married  1 son, 1 step daughter  Lives in [place]    Current Outpatient Prescriptions  Medication Sig Dispense Refill  . aspirin 81 MG tablet Take 81 mg by mouth daily.        . Azelastine HCl (ASTEPRO) 0.15 % SOLN Place into the nose.        . calcium carbonate (TUMS - DOSED IN MG ELEMENTAL CALCIUM) 500 MG chewable tablet Chew 1 tablet by mouth as needed.        . carvedilol (COREG) 6.25 MG tablet Take 1 tablet (6.25 mg total) by mouth 2 (two) times daily with a meal.  180 tablet  5  . citalopram (CELEXA) 10 MG tablet Take 10 mg by mouth as needed.       Marland Kitchen losartan (COZAAR) 50 MG tablet Take 50 mg by mouth daily.      . meclizine (ANTIVERT) 12.5 MG tablet Take 12.5 mg by mouth 3 (three) times daily as needed. For dizziness      . Multiple Vitamins-Minerals (MULTIVITAMINS THER. W/MINERALS) TABS Take 1 tablet by mouth daily.      . naproxen sodium (ANAPROX) 550 MG tablet Take 550 mg by mouth as needed. For pain      . Omega-3 Fatty Acids (FISH OIL) 1000 MG CAPS Take 1 capsule by mouth daily.        . rosuvastatin (CRESTOR) 40 MG tablet Take 20 mg by mouth daily.      Marland Kitchen testosterone enanthate (DELATESTRYL) 200 MG/ML injection Inject into the muscle every 28 (twenty-eight) days. For IM use only      . topiramate (TOPAMAX) 100 MG tablet Take 50 mg by mouth daily.          BP 139/84  Pulse 67  Resp 18  Ht 6\' 2"  (1.88 m)  Wt 283 lb (128.368 kg)  BMI 36.34 kg/m2 General: NAD, obese.  Neck: No JVD, no thyromegaly or thyroid nodule.  Lungs: Clear to auscultation bilaterally with normal respiratory effort. CV: Nondisplaced PMI.  Heart regular S1/S2, no S3/S4, no murmur.  No peripheral edema.  No carotid bruit.  Normal pedal pulses.  Abdomen: Soft, nontender, no hepatosplenomegaly, no distention.  Neurologic: Alert and oriented x 3.  Psych: Normal affect. Extremities: No clubbing or cyanosis.   Assessment/Plan:  CAD LHC this year showed stable to improved coronary disease. I do not think that his exertional dyspnea is related to his coronary disease.  Continue ASA 81, ARB, Coreg, and Crestor.  HYPERLIPIDEMIA Continue Crestor given known CAD, will get lipids. DYSPNEA ON EXERTION  Chronic exertional dyspnea. I do not think that this is related to coronary disease. BNP recently was normal and he does not appear volume overloaded on exam.  It most likely is multifactorial, from deconditioning, weight gain, and his chronic pulmonary scarring. He needs to continue regular exercise and to work on diet for weight loss.   Marca Ancona 01/15/2012

## 2012-01-15 NOTE — Patient Instructions (Addendum)
Your physician recommends that you have  a FASTING lipid profile today.  Your physician wants you to follow-up in: 6 months with Dr Shirlee Latch. ((June 2014).  You will receive a reminder letter in the mail two months in advance. If you don't receive a letter, please call our office to schedule the follow-up appointment.

## 2012-01-20 ENCOUNTER — Ambulatory Visit (INDEPENDENT_AMBULATORY_CARE_PROVIDER_SITE_OTHER): Payer: Medicare Other | Admitting: Family Medicine

## 2012-01-20 VITALS — BP 121/71 | HR 70

## 2012-01-20 DIAGNOSIS — E291 Testicular hypofunction: Secondary | ICD-10-CM

## 2012-01-20 MED ORDER — TESTOSTERONE CYPIONATE 200 MG/ML IM SOLN
300.0000 mg | Freq: Once | INTRAMUSCULAR | Status: AC
  Administered 2012-01-20: 300 mg via INTRAMUSCULAR

## 2012-01-20 NOTE — Progress Notes (Signed)
  Subjective:    Patient ID: Jeremy Woodward, male    DOB: 1945-10-16, 66 y.o.   MRN: [id number]  HPI   Here for testosterone injection Review of Systems     Objective:   Physical Exam        Assessment & Plan:

## 2012-01-20 NOTE — Progress Notes (Signed)
Patient tolerated without complications 

## 2012-02-13 ENCOUNTER — Ambulatory Visit (INDEPENDENT_AMBULATORY_CARE_PROVIDER_SITE_OTHER): Payer: Medicare Other | Admitting: Family Medicine

## 2012-02-13 ENCOUNTER — Encounter: Payer: Self-pay | Admitting: Family Medicine

## 2012-02-13 VITALS — BP 109/66 | HR 62 | Wt 283.0 lb

## 2012-02-13 DIAGNOSIS — Z5181 Encounter for therapeutic drug level monitoring: Secondary | ICD-10-CM | POA: Diagnosis not present

## 2012-02-13 DIAGNOSIS — I10 Essential (primary) hypertension: Secondary | ICD-10-CM | POA: Diagnosis not present

## 2012-02-13 DIAGNOSIS — R4589 Other symptoms and signs involving emotional state: Secondary | ICD-10-CM

## 2012-02-13 DIAGNOSIS — R197 Diarrhea, unspecified: Secondary | ICD-10-CM

## 2012-02-13 DIAGNOSIS — E291 Testicular hypofunction: Secondary | ICD-10-CM

## 2012-02-13 DIAGNOSIS — R6889 Other general symptoms and signs: Secondary | ICD-10-CM

## 2012-02-13 DIAGNOSIS — Z79899 Other long term (current) drug therapy: Secondary | ICD-10-CM | POA: Diagnosis not present

## 2012-02-13 DIAGNOSIS — L570 Actinic keratosis: Secondary | ICD-10-CM

## 2012-02-13 MED ORDER — DIPHENOXYLATE-ATROPINE 2.5-0.025 MG PO TABS: 1.0000 | ORAL_TABLET | Freq: Four times a day (QID) | ORAL | Status: DC | PRN

## 2012-02-13 MED ORDER — CITALOPRAM HYDROBROMIDE 10 MG PO TABS: 10.0000 mg | ORAL_TABLET | ORAL | Status: DC | PRN

## 2012-02-13 NOTE — Progress Notes (Signed)
CC: Jeremy Woodward is a 67 y.o. male is here for f/u testosterone   Subjective: HPI:  Patient presents for routine followup  Essential hypertension: Continues on Coreg 6.25 mg twice a day and losartan 50 mg with no outside blood pressures to report that normotensive in medical record at his cardiologist. Denies headaches, motor sensory disturbances, chest pain, shortness of breath, orthopnea, peripheral edema, rapid heartbeat, nor irregular heartbeat. Hypergonadism: The past 3 months he's been getting testosterone injections every 3 weeks. He notices that since then she's become more irritable and has a"short fuse". He also is concerned that his appetite has skyrocketed and he is slowly gaining weight despite almost daily exercise. He notes that he has seen no improvement with tearfulness, erection, nor libido. Tearfulness: This is been present for years, recently has gotten to the point were he wants to avoid social situations. Almost anything without predictability will cause him To become tearful however he expresses absolutely no feelings of sadness and actually feels a mixture of humor and embarrassment when this occurs. He has no control over it he denies depression or anxiety nor sadness.Nothing seems to make this better or worse. Diarrhea: Over the past 2 weeks he has had on and off loose stools up to 3 times a day. It is proceeded with a cramping sensation in his lower abdomen that is relieved with defecation. Denies appetite changes, nausea, vomiting, food aversion, unintentional weight loss, accidents, nor waking at night. Denies blood or tar-like stools He has some flaking in his left ear that was treated with cryotherapy a recent visit, it initially improved but a mild part has began to flake again. He describes a"irritation"at the site but denies itching.      Review Of Systems Outlined In HPI  Past Medical History  Diagnosis Date  . Depression   . Hyperlipidemia   . Obesity     . BPH (benign prostatic hypertrophy)   . Hypertension   . CAD (coronary artery disease)   . OSA on CPAP   . Arthritis   . Diarrhea   . CHF (congestive heart failure)      Family History  Problem Relation Age of Onset  . Hyperlipidemia Sister   . Hypertension Sister   . Colon cancer Maternal Grandmother     late 17's when dx   . Cancer Father 26    liver and lung  . Heart disease Mother   . Ulcers Mother   . Hypertension Mother   . Aneurysm Mother      History  Substance Use Topics  . Smoking status: Never Smoker   . Smokeless tobacco: Never Used     Comment: cigars  . Alcohol Use: No     Objective: Filed Vitals:   02/13/12 0906  BP: 109/66  Pulse: 62    General: Alert and Oriented, No Acute Distress. Occasional tearful episodes  HEENT: Pupils equal, round, reactive to light. Conjunctivae clear.  External ears unremarkable, canals clear with intact TMs with appropriate landmarks.  Middle ear appears open without effusion. Pink inferior turbinates.  Moist mucous membranes, pharynx without inflammation nor lesions.  Neck supple without palpable lymphadenopathy nor abnormal masses. Lungs: Clear to auscultation bilaterally, no wheezing/ronchi/rales.  Comfortable work of breathing. Good air movement. Cardiac: Regular rate and rhythm. Normal S1/S2.  No murmurs, rubs, nor gallops.   Abdomen:  obese Normal bowel sounds, soft and non tender without palpable masses. Extremities: No peripheral edema.  Strong peripheral pulses.  Mental Status: No  depression, anxiety, nor agitation. Skin: Warm and dry.2 x 2 millimeter area of hyperkeratotic skin at 3:00 position of the left ear canal without erythema and   Assessment & Plan: Treylon was seen today for f/u testosterone.  Diagnoses and associated orders for this visit:  Essential hypertension, benign  Hypogonadism male  Encounter for monitoring testosterone replacement therapy  Tearfulness - citalopram (CELEXA) 10 MG  tablet; Take 1 tablet (10 mg total) by mouth as needed.  Diarrhea - Clostridium difficile toxin - Stool Culture  Actinic keratosis  Other Orders - Cancel: CBC - Cancel: PSA, Medicare - Discontinue: diphenoxylate-atropine (LOMOTIL) 2.5-0.025 MG per tablet; Take 1 tablet by mouth 4 (four) times daily as needed for diarrhea or loose stools. - diphenoxylate-atropine (LOMOTIL) 2.5-0.025 MG per tablet; Take 1 tablet by mouth 4 (four) times daily as needed for diarrhea or loose stools.     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 Difficile  25 minutes spent face-to-face during visit today of which at least 50% was counseling or coordinating care regarding tearfulness, hypogonadism, diarrhea.   Return in about 3 months (around 05/13/2012).   Cryotherapy Procedure Note  Pre-operative Diagnosis: Actinic keratosis  Post-operative Diagnosis: Actinic keratosis  Locations: left ear  Indications: precancerous irritation  Anesthesia: none  Procedure Details   Patient informed of risks (permanent scarring, infection, light or dark discoloration, bleeding, infection, weakness, numbness and recurrence of the lesion) and benefits of the procedure and verbal informed consent obtained.  The areas are treated with liquid nitrogen therapy, frozen until ice ball extended 2 mm beyond lesion, allowed to thaw, and treated again. The patient tolerated procedure well.  The patient was instructed on post-op care, warned that there may be blister formation, redness and pain.  Recommend OTC analgesia as needed for pain.  Condition: Stable  Complications: none.  Plan: 1. Instructed to keep the area dry and covered for 24-48h and clean thereafter. 2. Warning signs of infection were reviewed.   3. Recommended that the patient use OTC analgesics as needed for pain.  4. Return in 3 months

## 2012-02-18 LAB — STOOL CULTURE

## 2012-02-19 ENCOUNTER — Telehealth: Payer: Self-pay | Admitting: Family Medicine

## 2012-02-19 LAB — CLOSTRIDIUM DIFFICILE EIA: CDIFTX: NEGATIVE

## 2012-02-19 NOTE — Telephone Encounter (Signed)
error 

## 2012-02-20 ENCOUNTER — Telehealth: Payer: Self-pay | Admitting: Family Medicine

## 2012-02-20 DIAGNOSIS — IMO0001 Reserved for inherently not codable concepts without codable children: Secondary | ICD-10-CM | POA: Diagnosis not present

## 2012-02-20 DIAGNOSIS — K529 Noninfective gastroenteritis and colitis, unspecified: Secondary | ICD-10-CM

## 2012-02-20 DIAGNOSIS — G4485 Primary stabbing headache: Secondary | ICD-10-CM | POA: Diagnosis not present

## 2012-02-20 DIAGNOSIS — G4733 Obstructive sleep apnea (adult) (pediatric): Secondary | ICD-10-CM | POA: Diagnosis not present

## 2012-02-20 NOTE — Telephone Encounter (Signed)
Pt notified and pt is ok with GI referral

## 2012-02-20 NOTE — Telephone Encounter (Signed)
Sue Lush, Will you please let Jeremy Woodward know that his stool studies all came back negative for any pathologic bacterial infection contributing to his diarrhea.  If his bowel habits have not changed for the better since I saw him last then I think he warrants a gastroenterology consult.  Please let me know if that has been no improvement and I'll place the referral.

## 2012-02-20 NOTE — Telephone Encounter (Signed)
Left message with spouse to call back

## 2012-02-28 ENCOUNTER — Other Ambulatory Visit: Payer: Self-pay | Admitting: Cardiology

## 2012-03-20 ENCOUNTER — Ambulatory Visit: Payer: Medicare Other | Admitting: Gastroenterology

## 2012-03-20 ENCOUNTER — Ambulatory Visit (INDEPENDENT_AMBULATORY_CARE_PROVIDER_SITE_OTHER): Payer: Medicare Other | Admitting: Gastroenterology

## 2012-03-20 ENCOUNTER — Encounter: Payer: Self-pay | Admitting: Gastroenterology

## 2012-03-20 VITALS — BP 126/74 | HR 76 | Ht 72.44 in | Wt 275.5 lb

## 2012-03-20 DIAGNOSIS — Z860100 Personal history of colon polyps, unspecified: Secondary | ICD-10-CM | POA: Insufficient documentation

## 2012-03-20 DIAGNOSIS — R197 Diarrhea, unspecified: Secondary | ICD-10-CM | POA: Diagnosis not present

## 2012-03-20 NOTE — Progress Notes (Signed)
History of Present Illness: Pleasant 67 year old white male referred at the request of Dr. Ivan Anchors evaluation of diarrhea. For the past 6 weeks she's had loose stools accompanied by urgency. Stools have been watery. Stool studies including C. difficile toxin and bacterial pathogens was negative. Symptoms are slowly subsiding. At this point she is having 1-2 poorly formed stools a day. Urgency has subsided. There is no history of rectal bleeding. He's been on no antibiotics or has had any recent travel. There have been no recent changes in medication with the exception of the addition of Celexa subsequent to the onset of diarrhea.  Colonoscopy in 2012 demonstrated an adenomatous polyp.   Past Medical History  Diagnosis Date  . Depression   . Hyperlipidemia   . Obesity   . BPH (benign prostatic hypertrophy)   . Hypertension   . CAD (coronary artery disease)   . OSA on CPAP   . Arthritis   . Diarrhea   . CHF (congestive heart failure)    Past Surgical History  Procedure Laterality Date  . Tonsillectomy    . Lung decortication  1996    right with drainage utilizing VAT for empyema   family history includes Aneurysm in his mother; Cancer (age of onset: 51) in his father; Colon cancer in his maternal grandmother; Heart disease in his mother; Hyperlipidemia in his sister; Hypertension in his mother and sister; and Ulcers in his mother. Current Outpatient Prescriptions  Medication Sig Dispense Refill  . aspirin 81 MG tablet Take 81 mg by mouth daily.        . calcium carbonate (TUMS - DOSED IN MG ELEMENTAL CALCIUM) 500 MG chewable tablet Chew 1 tablet by mouth as needed.        . carvedilol (COREG) 6.25 MG tablet Take 1 tablet (6.25 mg total) by mouth 2 (two) times daily with a meal.  180 tablet  5  . citalopram (CELEXA) 10 MG tablet Take 1 tablet (10 mg total) by mouth as needed.  30 tablet  5  . diphenoxylate-atropine (LOMOTIL) 2.5-0.025 MG per tablet Take 1 tablet by mouth 4 (four) times daily  as needed for diarrhea or loose stools.  30 tablet  0  . Fiber POWD       . losartan (COZAAR) 50 MG tablet ONE TABLET DAILY  30 tablet  5  . Multiple Vitamins-Minerals (MULTIVITAMINS THER. W/MINERALS) TABS Take 1 tablet by mouth daily.      . naproxen sodium (ANAPROX) 550 MG tablet Take 550 mg by mouth as needed. For pain      . Omega-3 Fatty Acids (FISH OIL) 1000 MG CAPS Take 1 capsule by mouth daily.        . rosuvastatin (CRESTOR) 40 MG tablet Take 20 mg by mouth daily.      . Wheat Dextrin (BENEFIBER) POWD Take 2 tsp by mouth daily as needed       No current facility-administered medications for this visit.   Allergies as of 03/20/2012  . (No Known Allergies)    reports that he has never smoked. He has never used smokeless tobacco. He reports that he does not drink alcohol or use illicit drugs.     Review of Systems: Pertinent positive and negative review of systems were noted in the above HPI section. All other review of systems were otherwise negative.  Vital signs were reviewed in today's medical record Physical Exam: General: Well developed , well nourished, no acute distress Skin: anicteric Head: Normocephalic and atraumatic Eyes:  sclerae anicteric, EOMI Ears: Normal auditory acuity Mouth: No deformity or lesions Neck: Supple, no masses or thyromegaly Lungs: Clear throughout to auscultation Heart: Regular rate and rhythm; no murmurs, rubs or bruits Abdomen: Soft, non tender and non distended. No masses, hepatosplenomegaly or hernias noted. Normal Bowel sounds Rectal:deferred Musculoskeletal: Symmetrical with no gross deformities  Skin: No lesions on visible extremities Pulses:  Normal pulses noted Extremities: No clubbing, cyanosis, edema or deformities noted Neurological: Alert oriented x 4, grossly nonfocal Cervical Nodes:  No significant cervical adenopathy Inguinal Nodes: No significant inguinal adenopathy Psychological:  Alert and cooperative. Normal mood and  affect

## 2012-03-20 NOTE — Assessment & Plan Note (Signed)
Follow-up colonoscopy 2017 

## 2012-03-20 NOTE — Patient Instructions (Addendum)
We are giving you samples of a probiotic for you to take once a day for two weeks Follow up as needed

## 2012-03-20 NOTE — Assessment & Plan Note (Signed)
Six-week history of a diarrheal illness which appears to be subsiding. Suspect viral gastroenteritis with perhaps postinfectious IBS-like symptoms.  Recommendations #1 continue fiber #2 two-week course of a probiotic

## 2012-04-21 DIAGNOSIS — M25569 Pain in unspecified knee: Secondary | ICD-10-CM | POA: Diagnosis not present

## 2012-07-16 ENCOUNTER — Other Ambulatory Visit: Payer: Self-pay | Admitting: Cardiology

## 2012-08-19 ENCOUNTER — Other Ambulatory Visit: Payer: Self-pay | Admitting: *Deleted

## 2012-08-19 MED ORDER — ROSUVASTATIN CALCIUM 40 MG PO TABS: 20.0000 mg | ORAL_TABLET | Freq: Every day | ORAL | Status: DC

## 2012-08-20 ENCOUNTER — Telehealth: Payer: Self-pay | Admitting: Cardiology

## 2012-08-20 DIAGNOSIS — E785 Hyperlipidemia, unspecified: Secondary | ICD-10-CM

## 2012-08-20 DIAGNOSIS — I2510 Atherosclerotic heart disease of native coronary artery without angina pectoris: Secondary | ICD-10-CM

## 2012-08-20 NOTE — Telephone Encounter (Signed)
He can alternatively take atorvastatin 80 mg daily.  Will need lipids/LFTs in 2 months.

## 2012-08-20 NOTE — Telephone Encounter (Signed)
Patient states his cost for Crestor is too expensive and he is wanting to consult with Dr. Shirlee Latch to see if there are any other options. Informed patient I would forward request to Dr. Shirlee Latch and meanwhile, Crestor samples will be left at the front desk for him to pick up until he gets response from Dr. Shirlee Latch. Patient informed to check sample dosage so that he is taking correct dosage 20mg  daily of the Crestor.

## 2012-08-20 NOTE — Telephone Encounter (Signed)
New prob  Pt states that his Crestor now cost him over $200 and he wants to know if there is something else he can take.

## 2012-08-21 MED ORDER — ATORVASTATIN CALCIUM 80 MG PO TABS: 80.0000 mg | ORAL_TABLET | Freq: Every day | ORAL | Status: DC

## 2012-08-21 NOTE — Telephone Encounter (Signed)
Pt advised,verbalized understanding. 

## 2012-08-21 NOTE — Addendum Note (Signed)
Addended by: Jacqlyn Krauss on: 08/21/2012 09:44 AM   Modules accepted: Orders, Medications

## 2012-09-22 ENCOUNTER — Other Ambulatory Visit: Payer: Self-pay | Admitting: Cardiology

## 2012-10-14 ENCOUNTER — Other Ambulatory Visit: Payer: Self-pay | Admitting: Family Medicine

## 2012-10-23 ENCOUNTER — Other Ambulatory Visit (INDEPENDENT_AMBULATORY_CARE_PROVIDER_SITE_OTHER): Payer: Medicare Other

## 2012-10-23 DIAGNOSIS — I2510 Atherosclerotic heart disease of native coronary artery without angina pectoris: Secondary | ICD-10-CM | POA: Diagnosis not present

## 2012-10-23 DIAGNOSIS — E785 Hyperlipidemia, unspecified: Secondary | ICD-10-CM

## 2012-10-23 LAB — HEPATIC FUNCTION PANEL
Albumin: 4.1 g/dL (ref 3.5–5.2)
Alkaline Phosphatase: 49 U/L (ref 39–117)
Bilirubin, Direct: 0 mg/dL (ref 0.0–0.3)
Total Bilirubin: 0.7 mg/dL (ref 0.3–1.2)

## 2012-10-23 LAB — LIPID PANEL
LDL Cholesterol: 57 mg/dL (ref 0–99)
Total CHOL/HDL Ratio: 3
Triglycerides: 133 mg/dL (ref 0.0–149.0)

## 2012-10-24 ENCOUNTER — Other Ambulatory Visit: Payer: Self-pay | Admitting: Cardiology

## 2012-11-18 ENCOUNTER — Ambulatory Visit (INDEPENDENT_AMBULATORY_CARE_PROVIDER_SITE_OTHER): Payer: Medicare Other | Admitting: Family Medicine

## 2012-11-18 ENCOUNTER — Encounter: Payer: Self-pay | Admitting: Family Medicine

## 2012-11-18 VITALS — BP 122/69 | HR 67 | Ht 72.44 in | Wt 281.0 lb

## 2012-11-18 DIAGNOSIS — Z131 Encounter for screening for diabetes mellitus: Secondary | ICD-10-CM | POA: Diagnosis not present

## 2012-11-18 DIAGNOSIS — H9190 Unspecified hearing loss, unspecified ear: Secondary | ICD-10-CM | POA: Insufficient documentation

## 2012-11-18 DIAGNOSIS — Z0000 Encounter for general adult medical examination without abnormal findings: Secondary | ICD-10-CM | POA: Diagnosis not present

## 2012-11-18 DIAGNOSIS — H9193 Unspecified hearing loss, bilateral: Secondary | ICD-10-CM

## 2012-11-18 DIAGNOSIS — Z125 Encounter for screening for malignant neoplasm of prostate: Secondary | ICD-10-CM

## 2012-11-18 DIAGNOSIS — Z23 Encounter for immunization: Secondary | ICD-10-CM | POA: Diagnosis not present

## 2012-11-18 LAB — BASIC METABOLIC PANEL WITH GFR
CO2: 25 mEq/L (ref 19–32)
Chloride: 105 mEq/L (ref 96–112)
Creat: 1.23 mg/dL (ref 0.50–1.35)
Glucose, Bld: 100 mg/dL — ABNORMAL HIGH (ref 70–99)

## 2012-11-18 LAB — PSA, MEDICARE: PSA: 1.13 ng/mL (ref <=4.00)

## 2012-11-18 NOTE — Progress Notes (Signed)
Subjective:    Jeremy Woodward is a 67 y.o. male who presents for Medicare Annual/Subsequent preventive examination.   Preventive Screening-Counseling & Management  Tobacco History  Smoking status  . Never Smoker   Smokeless tobacco  . Never Used    Comment: cigars   Colonoscopy: 11/15/10 Due 2017 Prostate: Discussed screening risks/beneifts with patient on  11/18/2012 requesting PSA  AAA Screen: not indicated  Influenza Vaccine: will receive at pharmacy today Pneumovax: given 2013 Td/Tdap: Tdap 2012 Zoster: Given 2012  Problems Prior to Visit 1. HLD, HTN,CAD  Current Problems (verified) Patient Active Problem List   Diagnosis Date Noted  . Hearing loss 11/18/2012  . Diarrhea 03/20/2012  . Personal history of colonic polyps 03/20/2012  . Hypogonadism male 02/13/2012  . 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 Obesity 11/14/2011  . Low libido 11/06/2011  . Fatigue 11/06/2011  . Actinic keratosis 11/06/2011  . Nonspecific abnormal finding in stool contents 10/30/2010  . OSA on CPAP 10/23/2010  . CAD, NATIVE VESSEL 12/19/2008  . CHEST PAIN UNSPECIFIED 12/08/2008  . DYSPNEA ON EXERTION 08/31/2008  . Essential hypertension, benign 06/02/2008  . ADHESIVE CAPSULITIS OF SHOULDER 06/02/2008  . Headache 02/24/2007  . HYPOGONADISM 07/02/2006  . HYPERLIPIDEMIA 07/02/2006  . EXOGENOUS OBESITY 07/02/2006  . DEPRESSION 07/02/2006  . BENIGN PROSTATIC HYPERTROPHY 07/02/2006    Medications Prior to Visit Current Outpatient Prescriptions on File Prior to Visit  Medication Sig Dispense Refill  . aspirin 81 MG tablet Take 81 mg by mouth daily.        Marland Kitchen atorvastatin (LIPITOR) 80 MG tablet Take 1 tablet (80 mg total) by mouth daily.  30 tablet  3  . carvedilol (COREG) 6.25 MG tablet TAKE 1 TABLET TWICE A DAY WITH FOOD  180 tablet  0  . citalopram (CELEXA) 10 MG tablet TAKE 1 TABLET EVERY DAY AS NEEDED  30 tablet  0  . diphenoxylate-atropine (LOMOTIL) 2.5-0.025 MG per  tablet Take 1 tablet by mouth 4 (four) times daily as needed for diarrhea or loose stools.  30 tablet  0  . losartan (COZAAR) 50 MG tablet TAKE 1 TABLET EVERY DAY  30 tablet  5  . Multiple Vitamins-Minerals (MULTIVITAMINS THER. W/MINERALS) TABS Take 1 tablet by mouth daily.      . naproxen sodium (ANAPROX) 550 MG tablet Take 550 mg by mouth as needed. For pain       No current facility-administered medications on file prior to visit.    Current Medications (verified) Current Outpatient Prescriptions  Medication Sig Dispense Refill  . aspirin 81 MG tablet Take 81 mg by mouth daily.        Marland Kitchen atorvastatin (LIPITOR) 80 MG tablet Take 1 tablet (80 mg total) by mouth daily.  30 tablet  3  . carvedilol (COREG) 6.25 MG tablet TAKE 1 TABLET TWICE A DAY WITH FOOD  180 tablet  0  . citalopram (CELEXA) 10 MG tablet TAKE 1 TABLET EVERY DAY AS NEEDED  30 tablet  0  . diphenoxylate-atropine (LOMOTIL) 2.5-0.025 MG per tablet Take 1 tablet by mouth 4 (four) times daily as needed for diarrhea or loose stools.  30 tablet  0  . losartan (COZAAR) 50 MG tablet TAKE 1 TABLET EVERY DAY  30 tablet  5  . Multiple Vitamins-Minerals (MULTIVITAMINS THER. W/MINERALS) TABS Take 1 tablet by mouth daily.      . naproxen sodium (ANAPROX) 550 MG tablet Take 550 mg by mouth as needed. For pain  No current facility-administered medications for this visit.     Allergies (verified) Review of patient's allergies indicates no known allergies.   PAST HISTORY  Family History Family History  Problem Relation Age of Onset  . Hyperlipidemia Sister   . Hypertension Sister   . Colon cancer Maternal Grandmother     late 37's when dx   . Cancer Father 49    liver and lung  . Heart disease Mother   . Ulcers Mother   . Hypertension Mother   . Aneurysm Mother     Social History History  Substance Use Topics  . Smoking status: Never Smoker   . Smokeless tobacco: Never Used     Comment: cigars  . Alcohol Use: No     Are there smokers in your home (other than you)?  No  Risk Factors Current exercise habits: silver sneakers and building ramps with church groups  Dietary issues discussed: low saturated fat and low carb diet with emphasis on fruits and veggies.  Cardiac risk factors: advanced age (older than 11 for men, 67 for women) and hypertension.  Depression Screen (Note: if answer to either of the following is "Yes", a more complete depression screening is indicated)   Q1: Over the past two weeks, have you felt down, depressed or hopeless? No  Q2: Over the past two weeks, have you felt little interest or pleasure in doing things? No  Have you lost interest or pleasure in daily life? No  Do you often feel hopeless? No  Do you cry easily over simple problems? Yes  Activities of Daily Living In your present state of health, do you have any difficulty performing the following activities?:  Driving? No Managing money?  No Feeding yourself? No Getting from bed to chair? No Climbing a flight of stairs? No Preparing food and eating?: No Bathing or showering? No Getting dressed: No Getting to the toilet? No Using the toilet:No Moving around from place to place: No In the past year have you fallen or had a near fall?:No   Are you sexually active?  Yes  Do you have more than one partner?  No  Hearing Difficulties: Yes Do you often ask people to speak up or repeat themselves? Yes Do you experience ringing or noises in your ears? Yes Do you have difficulty understanding soft or whispered voices? Yes   Do you feel that you have a problem with memory? No  Do you often misplace items? Yes  Do you feel safe at home?  Yes  Cognitive Testing  Alert? Yes  Normal Appearance?Yes  Oriented to person? Yes  Place? Yes   Time? Yes  Recall of three objects?  Yes  Can perform simple calculations? Yes  Displays appropriate judgment?Yes  Can read the correct time from a watch face?Yes   Advanced  Directives have been discussed with the patient? Yes   List the Names of Other Physician/Practitioners you currently use: 1.  Dr. Shirlee Latch  Indicate any recent Medical Services you may have received from other than [facility] providers in the past year (date may be approximate).  Immunization History  Administered Date(s) Administered  . Influenza Split 10/11/2010, 11/06/2011  . Influenza Whole 12/01/2008, 12/11/2009  . Pneumococcal Polysaccharide 11/14/2011  . Td 12/01/2008  . Tdap 10/11/2010  . Zoster 10/11/2010    Screening Tests Health Maintenance  Topic Date Due  . Influenza Vaccine  08/28/2012  . Colonoscopy  11/05/2015  . Tetanus/tdap  10/10/2020  . Pneumococcal Polysaccharide Vaccine  Age 77 And Over  Completed  . Zostavax  Completed    All answers were reviewed with the patient and necessary referrals were made:  Laren Boom, DO   11/18/2012   History reviewed: allergies, current medications, past family history, past medical history, past social history, past surgical history and problem list  Review of Systems Review of Systems - General ROS: negative for - chills, fever, night sweats, weight gain or weight loss Ophthalmic ROS: negative for - decreased vision Psychological ROS: negative for - anxiety or depression ENT ROS: negative for -  nasal congestion, tinnitus or allergies, positive for tinnitus and hearing loss Hematological and Lymphatic ROS: negative for - bleeding problems, bruising or swollen lymph nodes Breast ROS: negative Respiratory ROS: no cough, shortness of breath, or wheezing Cardiovascular ROS: no chest pain or dyspnea on exertion Gastrointestinal ROS: no abdominal pain, change in bowel habits, or black or bloody stools Genito-Urinary ROS: negative for - genital discharge, genital ulcers, incontinence or abnormal bleeding from genitals Musculoskeletal ROS: negative for - joint pain or muscle pain Neurological ROS: negative for - headaches or memory  loss Dermatological ROS: negative for lumps, mole changes, rash and skin lesion changes   Objective:     Vision by Snellen chart: right eye:20/20, left eye:20/20 Blood pressure 122/69, pulse 67, height 6' 0.44" (1.84 m), weight 281 lb (127.461 kg). Body mass index is 37.65 kg/(m^2).  General: No Acute Distress HEENT: Atraumatic, normocephalic, conjunctivae normal without scleral icterus.  No nasal discharge, hearing grossly intact, TMs with good landmarks bilaterally with no middle ear abnormalities, posterior pharynx clear without oral lesions. Neck: Supple, trachea midline, no cervical nor supraclavicular adenopathy. Pulmonary: Clear to auscultation bilaterally without wheezing, rhonchi, nor rales. Cardiac: Regular rate and rhythm.  No murmurs, rubs, nor gallops. No peripheral edema.  2+ peripheral pulses bilaterally. Abdomen: Bowel sounds normal.  No masses.  Non-tender without rebound.  Negative Murphy's sign. GU: No inguinal hernia , testes are descended bilaterally MSK: Grossly intact, no signs of weakness.  Full strength throughout upper and lower extremities.  Full ROM in upper and lower extremities.  No midline spinal tenderness. Neuro: Gait unremarkable, CN II-XII grossly intact.  C5-C6 Reflex 2/4 Bilaterally, L4 Reflex 2/4 Bilaterally.  Cerebellar function intact. Rinne and Weber normal Skin: Significant only for scattered seborrheic keratoses on the back to centrally appear inflamed Psych: Alert and oriented to person/place/time.  Thought process normal. No anxiety/depression.     Assessment:     Obesity, hearing loss, otherwise unremarkable physical exam     Plan:     During the course of the visit the patient was educated and counseled about appropriate screening and preventive services including:    Influenza vaccine  Prostate cancer screening  Nutrition counseling   Diet review for nutrition referral? no  Patient Instructions (the written plan) was given to  the patient.  Medicare Attestation I have personally reviewed: The patient's medical and social history Their use of alcohol, tobacco or illicit drugs Their current medications and supplements The patient's functional ability including ADLs,fall risks, home safety risks, cognitive, and hearing and visual impairment Diet and physical activities Evidence for depression or mood disorders  The patient's weight, height, BMI, and visual acuity have been recorded in the chart.  I have made referrals, counseling, and provided education to the patient based on review of the above and I have provided the patient with a written personalized care plan for preventive services.     Patient has many  questions regarding weight loss medication I asked him to return at his convenience for discussion devoted to options, risks, benefits   Laren Boom, DO   11/18/2012

## 2012-11-18 NOTE — Patient Instructions (Signed)
Dr. Boykin Baetz's General Advice Following Your Complete Physical Exam  The Benefits of Regular Exercise: Unless you suffer from an uncontrolled cardiovascular condition, studies strongly suggest that regular exercise and physical activity will add to both the quality and length of your life.  The World Health Organization recommends 150 minutes of moderate intensity aerobic activity every week.  This is best split over 3-4 days a week, and can be as simple as a brisk walk for just over 35 minutes "most days of the week".  This type of exercise has been shown to lower LDL-Cholesterol, lower average blood sugars, lower blood pressure, lower cardiovascular disease risk, improve memory, and increase one's overall sense of wellbeing.  The addition of anaerobic (or "strength training") exercises offers additional benefits including but not limited to increased metabolism, prevention of osteoporosis, and improved overall cholesterol levels.  How Can I Strive For A Low-Fat Diet?: Current guidelines recommend that 25-35 percent of your daily energy (food) intake should come from fats.  One might ask how can this be achieved without having to dissect each meal on a daily basis?  Switch to skim or 1% milk instead of whole milk.  Focus on lean meats such as ground turkey, fresh fish, baked chicken, and lean cuts of beef as your source of dietary protein.  Consume less than 300mg/day of dietary cholesterol.  Limit trans fatty acid consumption primarily by limiting synthetic trans fats such as partially hydrogenated oils (Ex: fried fast foods).  Focus efforts on reducing your intake of "solid" fats (Ex: Butter).  Substitute olive or vegetable oil for solid fats where possible.  Moderation of Salt Intake: Provided you don't carry a diagnosis of congestive heart failure nor renal failure, I recommend a daily allowance of no more than 2300 mg of salt (sodium).  Keeping under this daily goal is associated with a  decreased risk of cardiovascular events, creeping above it can lead to elevated blood pressures and increases your risk of cardiovascular events.  Milligrams (mg) of salt is listed on all nutrition labels, and your daily intake can add up faster than you think.  Most canned and frozen dinners can pack in over half your daily salt allowance in one meal.    Lifestyle Health Risks: Certain lifestyle choices carry specific health risks.  As you may already know, tobacco use has been associated with increasing one's risk of cardiovascular disease, pulmonary disease, numerous cancers, among many other issues.  What you may not know is that there are medications and nicotine replacement strategies that can more than double your chances of successfully quitting.  I would be thrilled to help manage your quitting strategy if you currently use tobacco products.  When it comes to alcohol use, I've yet to find an "ideal" daily allowance.  Provided an individual does not have a medical condition that is exacerbated by alcohol consumption, general guidelines determine "safe drinking" as no more than two standard drinks for a man or no more than one standard drink for a male per day.  However, much debate still exists on whether any amount of alcohol consumption is technically "safe".  My general advice, keep alcohol consumption to a minimum for general health promotion.  If you or others believe that alcohol, tobacco, or recreational drug use is interfering with your life, I would be happy to provide confidential counseling regarding treatment options.  General "Over The Counter" Nutrition Advice: Postmenopausal women should aim for a daily calcium intake of 1200 mg, however a significant   portion of this might already be provided by diets including milk, yogurt, cheese, and other dairy products.  Vitamin D has been shown to help preserve bone density, prevent fatigue, and has even been shown to help reduce falls in the  elderly.  Ensuring a daily intake of 800 Units of Vitamin D is a good place to start to enjoy the above benefits, we can easily check your Vitamin D level to see if you'd potentially benefit from supplementation beyond 800 Units a day.  Folic Acid intake should be of particular concern to women of childbearing age.  Daily consumption of 400-800 mcg of Folic Acid is recommended to minimize the chance of spinal cord defects in a fetus should pregnancy occur.    For many adults, accidents still remain one of the most common culprits when it comes to cause of death.  Some of the simplest but most effective preventitive habits you can adopt include regular seatbelt use, proper helmet use, securing firearms, and regularly testing your smoke and carbon monoxide detectors.  Gilberto Streck B. Kamyah Wilhelmsen DO [facility] [address], [address] [place], [place] [address] Phone: [phone]  

## 2012-12-03 ENCOUNTER — Other Ambulatory Visit: Payer: Self-pay

## 2012-12-13 ENCOUNTER — Other Ambulatory Visit: Payer: Self-pay | Admitting: Family Medicine

## 2012-12-25 ENCOUNTER — Other Ambulatory Visit: Payer: Self-pay | Admitting: Cardiology

## 2013-01-26 ENCOUNTER — Other Ambulatory Visit: Payer: Self-pay | Admitting: Cardiology

## 2013-03-01 ENCOUNTER — Other Ambulatory Visit: Payer: Self-pay | Admitting: Cardiology

## 2013-03-18 ENCOUNTER — Other Ambulatory Visit: Payer: Self-pay | Admitting: Cardiovascular Disease

## 2013-03-23 ENCOUNTER — Other Ambulatory Visit: Payer: Self-pay | Admitting: Cardiovascular Disease

## 2013-04-07 ENCOUNTER — Ambulatory Visit (INDEPENDENT_AMBULATORY_CARE_PROVIDER_SITE_OTHER): Payer: Medicare Other | Admitting: Cardiology

## 2013-04-07 ENCOUNTER — Encounter: Payer: Self-pay | Admitting: Cardiology

## 2013-04-07 VITALS — BP 130/72 | HR 78 | Ht 74.0 in | Wt 276.0 lb

## 2013-04-07 DIAGNOSIS — I2510 Atherosclerotic heart disease of native coronary artery without angina pectoris: Secondary | ICD-10-CM

## 2013-04-07 MED ORDER — LOSARTAN POTASSIUM 50 MG PO TABS
50.0000 mg | ORAL_TABLET | Freq: Every day | ORAL | Status: DC
Start: 2013-04-07 — End: 2013-10-12

## 2013-04-07 MED ORDER — ATORVASTATIN CALCIUM 40 MG PO TABS: 40.0000 mg | ORAL_TABLET | Freq: Every day | ORAL | Status: DC

## 2013-04-07 NOTE — Assessment & Plan Note (Signed)
Patient is having some myalgias. Decrease Lipitor to 40 mg daily. Check lipids, liver and CK in 4 weeks.

## 2013-04-07 NOTE — Assessment & Plan Note (Signed)
He is having some fatigue. I wonder if his beta blocker may be contributing. I will wean his carvedilol to off. Decreased to 3.125 mg by mouth twice a day for 2 days and then discontinue. Follow blood pressure. Hopefully his symptoms will improve off of beta-blockade.

## 2013-04-07 NOTE — Assessment & Plan Note (Signed)
Continue aspirin and statin. 

## 2013-04-07 NOTE — Progress Notes (Signed)
HPI: 68 year old male for followup of coronary artery disease. Previously followed by Dr. Aundra Dubin. History of empyema and VATS/decortication, HTN, and long-standing exertional shortness of breath. Abd ultrasound 2010 with no aneurysm. CTA 2010 showed no pulmonary embolus. Left and right heart catheterization in 11/10 showed normal left and right heart filling pressures and no pulmonary hypertension; 50% distal left main stenosis and 50% proximal LAD stenosis. IVUS and FFR were done of both lesions, and it appeared that both were not hemodynamically significant and not likely to be the cause of his symptoms. He had a cardiopulmonary stress test in 12/10 to try to elucidate the cause of his shortness of breath. This was actually suggestive of possible ischemia. Therefore, he did an ETT-myoview in 12/10 as well. He became quite short of breath but exercised 8 minutes with no ECG changes and no evidence for ischemia or infarction. It was decided to continue medical management. He had an ETT-myoview a year later in 12/11, showing no ischemia or infarction. Patient reported somewhat increased exertional dyspnea as well as occasional chest pain episodes, so ETT was done in 1/13, showing 6:20 exercise with discontinuation due to profound dyspnea and some chest pain. Given very severe symptoms with ETT and progressive symptoms, LHC and RHC repeated and showed normal right and left heart filling pressure. LHC showed stable to improved coronary disease, with 40% distal LM and 40-50% proximal LAD.  Last seen 12/13. Since then, he has dyspnea on exertion unchanged. No orthopnea or PND. Occasional mild pedal edema. No chest pain.   Current Outpatient Prescriptions  Medication Sig Dispense Refill  . aspirin 81 MG tablet Take 81 mg by mouth daily.        Marland Kitchen atorvastatin (LIPITOR) 80 MG tablet TAKE 1 TABLET EVERY DAY  30 tablet  3  . calcium carbonate (TUMS - DOSED IN MG ELEMENTAL CALCIUM) 500 MG chewable tablet 2  tablets. As needed      . carvedilol (COREG) 6.25 MG tablet TAKE 1 TABLET TWICE A DAY WITH FOOD *NEED APPOINTMENT  30 tablet  0  . citalopram (CELEXA) 10 MG tablet TAKE 1 TABLET EVERY DAY AS NEEDED  30 tablet  0  . diphenoxylate-atropine (LOMOTIL) 2.5-0.025 MG per tablet Take 1 tablet by mouth 4 (four) times daily as needed for diarrhea or loose stools.  30 tablet  0  . losartan (COZAAR) 50 MG tablet TAKE 1 TABLET EVERY DAY  30 tablet  5  . Multiple Vitamins-Minerals (MULTIVITAMINS THER. W/MINERALS) TABS Take 1 tablet by mouth daily.      . naproxen sodium (ANAPROX) 550 MG tablet Take 550 mg by mouth as needed. For pain       No current facility-administered medications for this visit.     Past Medical History  Diagnosis Date  . Depression   . Hyperlipidemia   . Obesity   . BPH (benign prostatic hypertrophy)   . Hypertension   . CAD (coronary artery disease)   . OSA on CPAP   . Arthritis   . Diarrhea   . CHF (congestive heart failure)     Past Surgical History  Procedure Laterality Date  . Tonsillectomy    . Lung decortication  1996    right with drainage utilizing VAT for empyema    History   Social History  . Marital Status: Married    Spouse Name: N/A    Number of Children: 1  . Years of Education: N/A   Occupational History  .  Retired    Social History Main Topics  . Smoking status: Never Smoker   . Smokeless tobacco: Never Used     Comment: cigars  . Alcohol Use: No  . Drug Use: No  . Sexual Activity: Not on file   Other Topics Concern  . Not on file   Social History Narrative  . No narrative on file    ROS: no fevers or chills, productive cough, hemoptysis, dysphasia, odynophagia, melena, hematochezia, dysuria, hematuria, rash, seizure activity, orthopnea, PND, pedal edema, claudication. Remaining systems are negative.  Physical Exam: Well-developed obese in no acute distress.  Skin is warm and dry.  HEENT is normal.  Neck is supple.  Chest is  clear to auscultation with normal expansion.  Cardiovascular exam is regular rate and rhythm.  Abdominal exam nontender or distended. No masses palpated. Extremities show trace edema. neuro grossly intact  ECG sinus rhythm at a rate of 78. RV conduction delay.

## 2013-04-07 NOTE — Patient Instructions (Signed)
Your physician wants you to follow-up in: [place] will receive a reminder letter in the mail two months in advance. If you don't receive a letter, please call our office to schedule the follow-up appointment.   DECREASE LIPITOR TO 40 MG ONCE DAILY  Your physician recommends that you return for lab work in:4 [place]- DO NOT EAT PRIOR TO LAB WORK  DECREASE CARVEDILOL TO 1/2 TABLET TWICE DAILY FOR TWO DAYS AND THEN STOP

## 2013-04-22 DIAGNOSIS — M542 Cervicalgia: Secondary | ICD-10-CM | POA: Diagnosis not present

## 2013-04-22 DIAGNOSIS — M5030 Other cervical disc degeneration, unspecified cervical region: Secondary | ICD-10-CM | POA: Diagnosis not present

## 2013-04-28 DIAGNOSIS — I2510 Atherosclerotic heart disease of native coronary artery without angina pectoris: Secondary | ICD-10-CM | POA: Diagnosis not present

## 2013-04-28 LAB — LIPID PANEL
CHOL/HDL RATIO: 3.2 ratio
CHOLESTEROL: 142 mg/dL (ref 0–200)
HDL: 45 mg/dL (ref >39)
LDL Cholesterol: 61 mg/dL (ref 0–99)
Triglycerides: 182 mg/dL — ABNORMAL HIGH (ref <150)
VLDL: 36 mg/dL (ref 0–40)

## 2013-04-28 LAB — HEPATIC FUNCTION PANEL
ALT: 39 U/L (ref 0–53)
AST: 23 U/L (ref 0–37)
Albumin: 4.2 g/dL (ref 3.5–5.2)
Alkaline Phosphatase: 49 U/L (ref 39–117)
Bilirubin, Direct: 0.1 mg/dL (ref 0.0–0.3)
Indirect Bilirubin: 0.6 mg/dL (ref 0.2–1.2)
Total Bilirubin: 0.7 mg/dL (ref 0.2–1.2)
Total Protein: 6.2 g/dL (ref 6.0–8.3)

## 2013-04-28 LAB — CK: CK TOTAL: 116 U/L (ref 7–232)

## 2013-04-29 ENCOUNTER — Encounter: Payer: Self-pay | Admitting: *Deleted

## 2013-05-19 ENCOUNTER — Encounter: Payer: Self-pay | Admitting: Family Medicine

## 2013-05-19 ENCOUNTER — Ambulatory Visit (INDEPENDENT_AMBULATORY_CARE_PROVIDER_SITE_OTHER): Payer: Medicare Other | Admitting: Family Medicine

## 2013-05-19 VITALS — BP 128/73 | HR 84 | Ht 75.0 in | Wt 290.0 lb

## 2013-05-19 DIAGNOSIS — H00019 Hordeolum externum unspecified eye, unspecified eyelid: Secondary | ICD-10-CM

## 2013-05-19 DIAGNOSIS — I2510 Atherosclerotic heart disease of native coronary artery without angina pectoris: Secondary | ICD-10-CM

## 2013-05-19 DIAGNOSIS — E669 Obesity, unspecified: Secondary | ICD-10-CM

## 2013-05-19 DIAGNOSIS — L821 Other seborrheic keratosis: Secondary | ICD-10-CM

## 2013-05-19 DIAGNOSIS — R635 Abnormal weight gain: Secondary | ICD-10-CM | POA: Diagnosis not present

## 2013-05-19 DIAGNOSIS — Z9989 Dependence on other enabling machines and devices: Principal | ICD-10-CM

## 2013-05-19 DIAGNOSIS — R5381 Other malaise: Secondary | ICD-10-CM

## 2013-05-19 DIAGNOSIS — R5383 Other fatigue: Secondary | ICD-10-CM

## 2013-05-19 DIAGNOSIS — G4733 Obstructive sleep apnea (adult) (pediatric): Secondary | ICD-10-CM | POA: Diagnosis not present

## 2013-05-19 MED ORDER — ERYTHROMYCIN 5 MG/GM OP OINT: 1.0000 "application " | TOPICAL_OINTMENT | Freq: Four times a day (QID) | OPHTHALMIC | Status: DC

## 2013-05-19 NOTE — Progress Notes (Signed)
CC: Jeremy Woodward is a 68 y.o. male is here for stye on left lower lid and discuss wt gain   Subjective: HPI:  Complains of growth on the left lower eyelid that has been present for 3 weeks. It has been improving with warm compresses multiple times during the day. As of one week ago it was almost completely gone however he stopped compresses and has been slowly growing. Today without any intervention other than being outside in the sun it has shrunk about 50%. It is slightly tender and not interfering with vision. He denies any discharge from the eye or ocular pain. No interventions other than above. Symptoms are persistent. Denies fevers, chills, nasal congestion, skin changes elsewhere on the face.  Expresses concerns with weight gain over the past year. He describes 25 pounds of weight gain unintentionally. No formal exercise routine. He tries to watch what he eats and has seen a nutritionist in the last one-2 years who actually told him that he was not ingesting enough calories throughout the day, per his report. He has had success with the Atkins diet and Weight Watchers in the past. He uses a CPAP machine nightly and states that he often changes the pressure settings by himself to find a setting where his wife states that he is not snoring. Most if not not all mornings of the week he awakens with subjective lack of restorative sleep. No napping during the day but he does report fatigue throughout the day. Denies family history of hypothyroidism or any other thyroid abnormality  He is a growth on the right side of the face that is mild to moderately irritated. Worse with shaving. Slowly grown over the course of years. Nothing particularly makes better or worse other than above.   Review Of Systems Outlined In HPI  Past Medical History  Diagnosis Date  . Depression   . Hyperlipidemia   . Obesity   . BPH (benign prostatic hypertrophy)   . Hypertension   . CAD (coronary artery disease)   .  OSA on CPAP   . Arthritis   . Diarrhea   . CHF (congestive heart failure)     Past Surgical History  Procedure Laterality Date  . Tonsillectomy    . Lung decortication  1996    right with drainage utilizing VAT for empyema   Family History  Problem Relation Age of Onset  . Hyperlipidemia Sister   . Hypertension Sister   . Colon cancer Maternal Grandmother     late 79's when dx   . Cancer Father 10    liver and lung  . Heart disease Mother   . Ulcers Mother   . Hypertension Mother   . Aneurysm Mother     History   Social History  . Marital Status: Married    Spouse Name: N/A    Number of Children: 1  . Years of Education: N/A   Occupational History  . Retired    Social History Main Topics  . Smoking status: Never Smoker   . Smokeless tobacco: Never Used     Comment: cigars  . Alcohol Use: No  . Drug Use: No  . Sexual Activity: Not on file   Other Topics Concern  . Not on file   Social History Narrative  . No narrative on file     Objective: BP 128/73  Pulse 84  Ht 6\' 3"  (1.905 m)  Wt 290 lb (131.543 kg)  BMI 36.25 kg/m2  General: Alert  and Oriented, No Acute Distress HEENT: Pupils equal, round, reactive to light. Conjunctivae clear with external stye on left lower lid, approximately 3 mm diameter.  Pink inferior turbinates.  Moist mucous membranes, pharynx without inflammation nor lesions.  Neck supple without palpable lymphadenopathy nor abnormal masses. Lungs: Clear to auscultation bilaterally, no wheezing/ronchi/rales.  Comfortable work of breathing. Good air movement. Cardiac: Regular rate and rhythm. Normal S1/S2.  No murmurs, rubs, nor gallops.   Abdomen: Obese and soft Extremities: Trace bilateral lower extremity edema, pain.  Strong peripheral pulses.  Mental Status: No depression, anxiety, nor agitation. Skin: Warm and dry.  Assessment & Plan: Kennieth was seen today for stye on left lower lid and discuss wt gain.  Diagnoses and associated  orders for this visit:  OSA on CPAP - Split night study; Future  Weight gain - TSH - COMPLETE METABOLIC PANEL WITH GFR - Split night study; Future  Obesity - TSH - COMPLETE METABOLIC PANEL WITH GFR - Split night study; Future  Stye external - erythromycin ophthalmic ointment; Place 1 application into the left eye 4 (four) times daily. Apply small 1cm ribbon under left eye lid for seven days.  Seborrheic keratoses  Fatigue - Split night study; Future    Unintentional weight gain: I like to have a repeat titration of his CPAP pressures, he is intolerant of AutoPap based on his report of prior use. Checking thyroid function metabolic panel. If the above do not reveal any source of his weight gain we will continue our discussion on Topamax versus other non-stimulant medications to help with weight loss which we discussed in length already today. Stye: Start warm compresses for 15 minutes 4 times a day with dilute baby shampoo, add erythromycin ointment ribbon 4 times a day following warm compresses. Seborrheic keratoses: Discussed benign nature of lesions however if causing irritation we can offer cryotherapy, we did not have enough time to perform today and he was kindly asked to return at his convenience for destruction if desired.  40 minutes spent face-to-face during visit today of which at least 50% was counseling or coordinating care regarding: 1. OSA on CPAP   2. Weight gain   3. Obesity   4. Stye external   5. Seborrheic keratoses   6. Fatigue      Return in about 4 weeks (around 06/16/2013).

## 2013-05-20 LAB — COMPLETE METABOLIC PANEL WITH GFR
ALT: 30 U/L (ref 0–53)
AST: 27 U/L (ref 0–37)
Albumin: 4.5 g/dL (ref 3.5–5.2)
Alkaline Phosphatase: 55 U/L (ref 39–117)
BILIRUBIN TOTAL: 0.6 mg/dL (ref 0.2–1.2)
BUN: 21 mg/dL (ref 6–23)
CO2: 22 mEq/L (ref 19–32)
CREATININE: 1.06 mg/dL (ref 0.50–1.35)
Calcium: 9.7 mg/dL (ref 8.4–10.5)
Chloride: 106 mEq/L (ref 96–112)
GFR, Est African American: 83 mL/min
GFR, Est Non African American: 72 mL/min
Glucose, Bld: 92 mg/dL (ref 70–99)
Potassium: 4.2 mEq/L (ref 3.5–5.3)
SODIUM: 137 meq/L (ref 135–145)
TOTAL PROTEIN: 6.6 g/dL (ref 6.0–8.3)

## 2013-05-20 LAB — TSH: TSH: 2.796 u[IU]/mL (ref 0.350–4.500)

## 2013-05-24 ENCOUNTER — Encounter: Payer: Self-pay | Admitting: Family Medicine

## 2013-05-24 ENCOUNTER — Ambulatory Visit (INDEPENDENT_AMBULATORY_CARE_PROVIDER_SITE_OTHER): Payer: Medicare Other | Admitting: Family Medicine

## 2013-05-24 VITALS — BP 132/72 | HR 76 | Wt 291.0 lb

## 2013-05-24 DIAGNOSIS — E669 Obesity, unspecified: Secondary | ICD-10-CM | POA: Diagnosis not present

## 2013-05-24 DIAGNOSIS — L259 Unspecified contact dermatitis, unspecified cause: Secondary | ICD-10-CM

## 2013-05-24 DIAGNOSIS — L820 Inflamed seborrheic keratosis: Secondary | ICD-10-CM

## 2013-05-24 DIAGNOSIS — I2510 Atherosclerotic heart disease of native coronary artery without angina pectoris: Secondary | ICD-10-CM | POA: Diagnosis not present

## 2013-05-24 DIAGNOSIS — L309 Dermatitis, unspecified: Secondary | ICD-10-CM

## 2013-05-24 MED ORDER — TRIAMCINOLONE ACETONIDE 0.1 % EX CREA: TOPICAL_CREAM | CUTANEOUS | Status: DC

## 2013-05-24 NOTE — Progress Notes (Signed)
CC: Jeremy Woodward is a 68 y.o. male is here for Liquid Nitrogen   Subjective: HPI:  Complains of bilateral ear irritation described as itching and flaking. Has been present off and on for the past years. Has been more problematic over the past month. It is worse the more he wears headphones and improves greatly with applying lotion on a daily basis. Symptoms are mild in severity. He denies skin changes elsewhere on the body other than that described below  Complains of growths on the back, left neck, and the right face that have been present for matter of months. They described as moderately irritated, stinging sensation. Symptoms are worse with manipulation nor any friction against the growths. They seem to be getting bigger on a monthly basis. He denies any fevers, chills, swollen lymph nodes, nor any personal history of skin cancers.  Complains of difficulty with losing weight for the past half of the year. Nothing particular seems to make symptoms better or worse. Since I saw him last he has tried his best when eating a diet that is heavily focused on vegetables and limiting fat. He's been walking on a daily basis as an exercise routine. Despite this he's gained a pound since he saw [place] last. He continues to have moderate daytime fatigue despite using CPAP on a nightly basis.  Review Of Systems Outlined In HPI  Past Medical History  Diagnosis Date  . Depression   . Hyperlipidemia   . Obesity   . BPH (benign prostatic hypertrophy)   . Hypertension   . CAD (coronary artery disease)   . OSA on CPAP   . Arthritis   . Diarrhea   . CHF (congestive heart failure)     Past Surgical History  Procedure Laterality Date  . Tonsillectomy    . Lung decortication  1996    right with drainage utilizing VAT for empyema   Family History  Problem Relation Age of Onset  . Hyperlipidemia Sister   . Hypertension Sister   . Colon cancer Maternal Grandmother     late 55's when dx   . Cancer  Father 31    liver and lung  . Heart disease Mother   . Ulcers Mother   . Hypertension Mother   . Aneurysm Mother     History   Social History  . Marital Status: Married    Spouse Name: N/A    Number of Children: 1  . Years of Education: N/A   Occupational History  . Retired    Social History Main Topics  . Smoking status: Never Smoker   . Smokeless tobacco: Never Used     Comment: cigars  . Alcohol Use: No  . Drug Use: No  . Sexual Activity: Not on file   Other Topics Concern  . Not on file   Social History Narrative  . No narrative on file     Objective: BP 132/72  Pulse 76  Wt 291 lb (131.997 kg)  General: Alert and Oriented, No Acute Distress HEENT: Pupils equal, round, reactive to light. Conjunctivae clear.  Moist mucous membranes pharynx unremarkable. Bilateral ear canals are open with open middle ears and tympanic membranes with good landmarks. The external canals has mild eczematous changes. Lungs: Clear to auscultation bilaterally, no wheezing/ronchi/rales.  Comfortable work of breathing. Good air movement. Cardiac: Regular rate and rhythm. Normal S1/S2.  No murmurs, rubs, nor gallops.   Abdomen: Obese soft nontender Extremities: No peripheral edema.  Strong peripheral pulses.  Mental Status: No depression, anxiety, nor agitation. Skin: Warm and dry. On the right cheek, left anterior neck, left flank, and on the back x5 he has moderately inflamed seborrheic keratoses ranging from 4 mm in diameter to 8 mm in diameter.  Assessment & Plan: Jeremy Woodward was seen today for liquid nitrogen.  Diagnoses and associated orders for this visit:  Eczema - triamcinolone cream (KENALOG) 0.1 %; Apply to affected areas twice a day for up to two weeks.  Morbid obesity  Inflamed seborrheic keratosis    Eczema of the ear canals, two-week treatment of triamcinolone.  Obesity: Uncontrolled, highly suspicious of CPAP machine running on an effective pressure setting, we have  arranged a sleep study on June 2 to address this. Discussed starting Topamax at his convenience for appetite suppression, he is not ready to start this quite yet. Inflamed subicteric keratoses: Lesions were destroyed with cryotherapy due to persistent pain.  Cryotherapy Procedure Note  Pre-operative Diagnosis: inflammed seborrheic keratoses   Post-operative Diagnosis: same  Locations: On the right cheek, left anterior neck, left flank, and on the back x5  Indications: persistent pain  Anesthesia: none  Procedure Details  History of allergy to iodine: no. Pacemaker? no.  Patient informed of risks (permanent scarring, infection, light or dark discoloration, bleeding, infection, weakness, numbness and recurrence of the lesion) and benefits of the procedure and verbal informed consent obtained.  The areas are treated with liquid nitrogen therapy, frozen until ice ball extended 3 mm beyond lesion, allowed to thaw, and treated again. The patient tolerated procedure well.  The patient was instructed on post-op care, warned that there may be blister formation, redness and pain. Recommend OTC analgesia as needed for pain.  Condition: Stable  Complications: none.  Plan: 1. Instructed to keep the area dry and covered for 24-48h and clean thereafter. 2. Warning signs of infection were reviewed.   3. Recommended that the patient use OTC analgesics as needed for pain.  4. Return PRN   Return in about 4 weeks (around 06/21/2013).

## 2013-05-26 ENCOUNTER — Other Ambulatory Visit: Payer: Self-pay | Admitting: Family Medicine

## 2013-05-27 NOTE — Telephone Encounter (Signed)
Is this ok to fill? Last filled in November 2014

## 2013-06-29 ENCOUNTER — Ambulatory Visit (HOSPITAL_BASED_OUTPATIENT_CLINIC_OR_DEPARTMENT_OTHER): Payer: Medicare Other | Attending: Family Medicine

## 2013-06-29 VITALS — Ht 73.0 in | Wt 280.0 lb

## 2013-06-29 DIAGNOSIS — G4710 Hypersomnia, unspecified: Secondary | ICD-10-CM | POA: Diagnosis not present

## 2013-06-29 DIAGNOSIS — I491 Atrial premature depolarization: Secondary | ICD-10-CM | POA: Insufficient documentation

## 2013-06-29 DIAGNOSIS — G4730 Sleep apnea, unspecified: Principal | ICD-10-CM

## 2013-06-29 DIAGNOSIS — Z9989 Dependence on other enabling machines and devices: Secondary | ICD-10-CM

## 2013-06-29 DIAGNOSIS — R0609 Other forms of dyspnea: Secondary | ICD-10-CM | POA: Diagnosis not present

## 2013-06-29 DIAGNOSIS — E669 Obesity, unspecified: Secondary | ICD-10-CM

## 2013-06-29 DIAGNOSIS — R635 Abnormal weight gain: Secondary | ICD-10-CM

## 2013-06-29 DIAGNOSIS — G4733 Obstructive sleep apnea (adult) (pediatric): Secondary | ICD-10-CM

## 2013-06-29 DIAGNOSIS — R0989 Other specified symptoms and signs involving the circulatory and respiratory systems: Secondary | ICD-10-CM | POA: Diagnosis not present

## 2013-06-29 DIAGNOSIS — R5383 Other fatigue: Secondary | ICD-10-CM

## 2013-07-03 DIAGNOSIS — G4733 Obstructive sleep apnea (adult) (pediatric): Secondary | ICD-10-CM | POA: Diagnosis not present

## 2013-07-03 DIAGNOSIS — E669 Obesity, unspecified: Secondary | ICD-10-CM | POA: Diagnosis not present

## 2013-07-03 NOTE — Sleep Study (Signed)
   NAME: Jeremy Woodward DATE OF BIRTH:  09-Jul-1945 MEDICAL RECORD NUMBER [id number]  LOCATION: [facility]  PHYSICIAN: Joyceline Maiorino D Graciana Sessa  DATE OF STUDY: 06/29/2013  SLEEP STUDY TYPE: Nocturnal Polysomnogram               REFERRING PHYSICIAN: Marcial Pacas, DO  INDICATION FOR STUDY: Hypersomnia with sleep apnea  EPWORTH SLEEPINESS SCORE:   11/24 HEIGHT: 6\' 1"  (185.4 cm)  WEIGHT: 127.007 kg (280 lb)    Body mass index is 36.95 kg/(m^2).  NECK SIZE: 18 in.  MEDICATIONS: Charted for review  SLEEP ARCHITECTURE: Total sleep time 176 minutes with sleep efficiency 48.1%. Stage I was 34.7%, stage II 63.6%, stage III absent, REM 1.7% of total sleep time. Sleep latency 46.5 minutes, REM latency 141.5 minutes, awake after sleep onset 123 minutes, arousal index 23.2, bedtime medication: Melatonin. Sustained intervals of wakefulness around 1 AM and 3 AM  RESPIRATORY DATA: Apnea hypopneas index (AHI) 52.8 per hour. 155 total events scored including 38 obstructive apneas, 12 central apneas, 105 hypopneas. Most events were while nonsupine. REM AHI 60 per hour. Insufficient sleep was obtained in the first hours of the study to meet protocol requirements for split CPAP titration.  OXYGEN DATA: Moderate to loud snoring with oxygen desaturation to a nadir of 78% and mean oxygen saturation through the study of 93% on room air.  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 REM AHI 60 per hour. Moderate to loud snoring with oxygen desaturation to a nadir of 78% and mean oxygen saturation through the study of 93% on room air. 2) The patient had significant difficulty maintaining sleep. Melatonin was taken at bedtime. Lack of sleep prevented him from meeting  protocol requirements for split CPAP titration on this study night. Management of an insomnia component may be helpful. 3) The patient reportedly wears CPAP at 9 CWP at home. If a dedicated CPAP titration study would be helpful, it can be scheduled through the sleep disorder Center.  Signed Baird Lyons M.D. [place], [employer]  ELECTRONICALLY SIGNED ON:  07/03/2013, 3:25 PM [place] PH: [phone]   FX: [phone] [place]

## 2013-07-05 ENCOUNTER — Telehealth: Payer: Self-pay | Admitting: Family Medicine

## 2013-07-05 DIAGNOSIS — G4733 Obstructive sleep apnea (adult) (pediatric): Secondary | ICD-10-CM

## 2013-07-05 DIAGNOSIS — Z9989 Dependence on other enabling machines and devices: Principal | ICD-10-CM

## 2013-07-05 MED ORDER — ZOLPIDEM TARTRATE 10 MG PO TABS: 10.0000 mg | ORAL_TABLET | Freq: Every evening | ORAL | Status: DC | PRN

## 2013-07-05 NOTE — Telephone Encounter (Signed)
Pt.notified

## 2013-07-05 NOTE — Telephone Encounter (Signed)
Seth Bake, Will you please let patient know that his sleep study confirmed severe sleep apnea but he wasn't asleep long enough for them to titrate the pressure on his CPAP.  I've printed an Rx for Lorrin Mais so that he can take a dose before the CPAP titration portion of a future sleep study.  I've put in another order for this study to be performed, please let us know if he's not been contacted about this by the end of the week. (Rx in your inbox).

## 2013-07-06 DIAGNOSIS — M5090 Cervical disc disorder, unspecified, unspecified cervical region: Secondary | ICD-10-CM | POA: Diagnosis not present

## 2013-07-06 DIAGNOSIS — M542 Cervicalgia: Secondary | ICD-10-CM | POA: Diagnosis not present

## 2013-07-14 DIAGNOSIS — M542 Cervicalgia: Secondary | ICD-10-CM | POA: Diagnosis not present

## 2013-07-21 DIAGNOSIS — M5030 Other cervical disc degeneration, unspecified cervical region: Secondary | ICD-10-CM | POA: Diagnosis not present

## 2013-07-28 DIAGNOSIS — M542 Cervicalgia: Secondary | ICD-10-CM | POA: Diagnosis not present

## 2013-08-01 ENCOUNTER — Ambulatory Visit (HOSPITAL_BASED_OUTPATIENT_CLINIC_OR_DEPARTMENT_OTHER): Payer: Medicare Other | Attending: Family Medicine | Admitting: Radiology

## 2013-08-01 VITALS — Ht 74.0 in | Wt 275.0 lb

## 2013-08-01 DIAGNOSIS — G4710 Hypersomnia, unspecified: Secondary | ICD-10-CM | POA: Diagnosis not present

## 2013-08-01 DIAGNOSIS — G4730 Sleep apnea, unspecified: Secondary | ICD-10-CM | POA: Diagnosis not present

## 2013-08-01 DIAGNOSIS — Z9989 Dependence on other enabling machines and devices: Secondary | ICD-10-CM

## 2013-08-01 DIAGNOSIS — G4733 Obstructive sleep apnea (adult) (pediatric): Secondary | ICD-10-CM

## 2013-08-02 ENCOUNTER — Encounter: Payer: Self-pay | Admitting: Family Medicine

## 2013-08-02 DIAGNOSIS — M5030 Other cervical disc degeneration, unspecified cervical region: Secondary | ICD-10-CM | POA: Insufficient documentation

## 2013-08-07 DIAGNOSIS — G4733 Obstructive sleep apnea (adult) (pediatric): Secondary | ICD-10-CM | POA: Diagnosis not present

## 2013-08-07 NOTE — Sleep Study (Signed)
   NAME: Jeremy Woodward DATE OF BIRTH:  [birth year] MEDICAL RECORD NUMBER [id number]  LOCATION: [facility]  PHYSICIAN: YOUNG,CLINTON D  DATE OF STUDY: 08/01/2013  SLEEP STUDY TYPE: Nocturnal Polysomnogram               REFERRING PHYSICIAN: Marcial Pacas, DO  INDICATION FOR STUDY: Hypersomnia with sleep apnea-CPAP titration  EPWORTH SLEEPINESS SCORE:   11/24  HEIGHT: 6\' 2"  (188 cm)  WEIGHT: 275 lb (124.739 kg)    Body mass index is 35.29 kg/(m^2).  NECK SIZE: 18 in.  MEDICATIONS: Charted  for review  SLEEP ARCHITECTURE: Total sleep time 258.5 minutes his sleep efficiency 65.9%. Stage I was 9.7%, stage II 75.2%, stage III absent, REM 15.1% of total sleep time. Sleep latency 43.5 minutes, REM latency 80 minutes, awake after sleep onset 90 minutes, arousal index 19.3. Bedtime medication: Ambien  RESPIRATORY DATA: CPAP titration protocol. CPAP was titrated to 15 CWP, AHI 3.9 per hour. He wore a large ResMed [employer] fullface mask with heated humidifier.  OXYGEN DATA: Snoring was prevented by CPAP and mean oxygen saturation held at 95.2% on room air.  CARDIAC DATA: Sinus rhythm with PVCs  MOVEMENT/PARASOMNIA: No significant movement disturbance, bathroom x2  IMPRESSION/ RECOMMENDATION:   1) Successful CPAP titration to 15 CWP, AHI 3.9 per hour. He wore a large ResMed [employer] fullface mask with heated humidifier. Snoring was prevented and mean oxygen saturation held at 95.2% on room air with CPAP 2) Baseline polysomnogram on 06/29/2013 recorded AHI 52.8 per hour with body weight 280 pounds.   Signed Baird Lyons M.D. Deneise Lever Diplomate, American Board of Sleep Medicine  ELECTRONICALLY SIGNED ON:  08/07/2013, 9:32 AM [place] PH: [phone]   FX: [phone] [place]

## 2013-08-16 ENCOUNTER — Other Ambulatory Visit: Payer: Self-pay | Admitting: Family Medicine

## 2013-08-17 ENCOUNTER — Telehealth: Payer: Self-pay | Admitting: *Deleted

## 2013-08-17 NOTE — Telephone Encounter (Signed)
Seth Bake will you please let patient know:  In the past sleep centers would arrange the adjustments to the CPAP device.  If Jeremy Woodward can let us know who supplies his CPAP supplies we can contact the supplier to make sure the adjustments get communicated and implemented on his machine.  Sorry, I just assumed this was already taken care of.  (His new settings are 15cmH2O using a large ResMed Mirage Quatro fullface mask with heated humidifier)

## 2013-08-17 NOTE — Telephone Encounter (Signed)
Pt left a message that he saw the results of the sleep study released on my chart and he wants to know what the next step is

## 2013-08-18 NOTE — Telephone Encounter (Signed)
Called pt to notify him of below. Pt had multiple questions some of which I could not answer. I advised that it would be best for him to schedule an appt to discuss options and the next steps with [place]. Pt states he will think about what he should do and give [place] a call back

## 2013-08-23 DIAGNOSIS — M5030 Other cervical disc degeneration, unspecified cervical region: Secondary | ICD-10-CM | POA: Diagnosis not present

## 2013-08-23 DIAGNOSIS — M5090 Cervical disc disorder, unspecified, unspecified cervical region: Secondary | ICD-10-CM | POA: Diagnosis not present

## 2013-08-25 ENCOUNTER — Encounter: Payer: Self-pay | Admitting: Family Medicine

## 2013-10-12 ENCOUNTER — Other Ambulatory Visit: Payer: Self-pay | Admitting: *Deleted

## 2013-10-12 MED ORDER — LOSARTAN POTASSIUM 50 MG PO TABS: 50.0000 mg | ORAL_TABLET | Freq: Every day | ORAL | Status: DC

## 2013-11-15 ENCOUNTER — Ambulatory Visit (INDEPENDENT_AMBULATORY_CARE_PROVIDER_SITE_OTHER): Payer: Medicare Other | Admitting: Family Medicine

## 2013-11-15 ENCOUNTER — Encounter: Payer: Self-pay | Admitting: Family Medicine

## 2013-11-15 VITALS — BP 144/71 | HR 74 | Wt 290.0 lb

## 2013-11-15 DIAGNOSIS — G4733 Obstructive sleep apnea (adult) (pediatric): Secondary | ICD-10-CM

## 2013-11-15 DIAGNOSIS — Z9989 Dependence on other enabling machines and devices: Secondary | ICD-10-CM

## 2013-11-15 DIAGNOSIS — I2510 Atherosclerotic heart disease of native coronary artery without angina pectoris: Secondary | ICD-10-CM

## 2013-11-15 DIAGNOSIS — I10 Essential (primary) hypertension: Secondary | ICD-10-CM | POA: Diagnosis not present

## 2013-11-15 DIAGNOSIS — G4700 Insomnia, unspecified: Secondary | ICD-10-CM | POA: Diagnosis not present

## 2013-11-15 DIAGNOSIS — E669 Obesity, unspecified: Secondary | ICD-10-CM

## 2013-11-15 MED ORDER — PHENTERMINE HCL 37.5 MG PO TABS: 37.5000 mg | ORAL_TABLET | Freq: Every day | ORAL | Status: DC

## 2013-11-15 MED ORDER — ZOLPIDEM TARTRATE 10 MG PO TABS: ORAL_TABLET | ORAL | Status: DC

## 2013-11-15 NOTE — Progress Notes (Signed)
CC: Jeremy Woodward is a 68 y.o. male is here for discuss weight loss options   Subjective: HPI:  Followup insomnia: Requesting refills on Ambien. He was provided a small prescription of this for a titration study for CPAP and noticed that when he took a single pill he had an extreme improvement of difficulty staying asleep. He reports that he has difficulty staying asleep on a nightly basis that fluctuates from mild to moderate in severity based on how stressful this day was.  Denies any difficulty falling asleep. Denies any side effects from taking this medication.  Followup sleep apnea: He is requesting records from his recent studies to share with his neurologist in order to get new CPAP supplies.  Followup obesity: He would like to know his medications on the market that can help him with losing weight. He reports difficulty with controlling his appetite and will often eat out of boredom. He states that even after eating a large meal he is satisfied but still feels the need to eat. Due to chronic knee pain he has no formal exercise routine but has experimented with a walking regimen he is trying to get into the habit of. He's been on medications in the past help with appetite but he forgets the names of them. He was on some Topamax for headaches which helped somewhat with appetite but side effects were intolerable.  Review Of Systems Outlined In HPI  Past Medical History  Diagnosis Date  . Depression   . Hyperlipidemia   . Obesity   . BPH (benign prostatic hypertrophy)   . Hypertension   . CAD (coronary artery disease)   . OSA on CPAP   . Arthritis   . Diarrhea   . CHF (congestive heart failure)     Past Surgical History  Procedure Laterality Date  . Tonsillectomy    . Lung decortication  1996    right with drainage utilizing VAT for empyema   Family History  Problem Relation Age of Onset  . Hyperlipidemia Sister   . Hypertension Sister   . Colon cancer Maternal Grandmother      late 72's when dx   . Cancer Father 21    liver and lung  . Heart disease Mother   . Ulcers Mother   . Hypertension Mother   . Aneurysm Mother     History   Social History  . Marital Status: Married    Spouse Name: N/A    Number of Children: 1  . Years of Education: N/A   Occupational History  . Retired    Social History Main Topics  . Smoking status: Never Smoker   . Smokeless tobacco: Never Used     Comment: cigars  . Alcohol Use: No  . Drug Use: No  . Sexual Activity: Not on file   Other Topics Concern  . Not on file   Social History Narrative  . No narrative on file     Objective: BP 144/71  Pulse 74  Wt 290 lb (131.543 kg)  Vital signs reviewed. General: Alert and Oriented, No Acute Distress HEENT: Pupils equal, round, reactive to light. Conjunctivae clear.  External ears unremarkable.  Moist mucous membranes. Lungs: Clear and comfortable work of breathing, speaking in full sentences without accessory muscle use. Cardiac: Regular rate and rhythm.  Abdomen: Obese and soft Extremities: No peripheral edema.  Strong peripheral pulses.  Mental Status: No depression, anxiety, nor agitation. Logical though process. Skin: Warm and dry.  Assessment &  Plan: Jeremy Woodward was seen today for discuss weight loss options.  Diagnoses and associated orders for this visit:  Essential hypertension, benign  OSA on CPAP  Obesity - phentermine (ADIPEX-P) 37.5 MG tablet; Take 1 tablet (37.5 mg total) by mouth daily before breakfast.  Insomnia - zolpidem (AMBIEN) 10 MG tablet; TAKE 1 TABLET BY MOUTH AT BEDTIME AS NEEDED FOR SLEEP    Essential hypertension: Uncontrolled, attempting to help better control with successful weight loss Obesity: Uncontrolled, start phentermine and discussed importance of returning on a monthly basis for consideration of refills and to make sure that it is not elevated blood pressure. Encouraged to start a physical activity regimen on a daily  basis even if it's just a walking regimen at least 30 minutes in duration Insomnia: Controlled on Ambien continue to take on an as-needed basis  Return in about 4 weeks (around 12/13/2013) for BP and Weight Office Visit.

## 2013-11-17 ENCOUNTER — Encounter: Payer: BC Managed Care – PPO | Admitting: Family Medicine

## 2013-11-19 ENCOUNTER — Ambulatory Visit (INDEPENDENT_AMBULATORY_CARE_PROVIDER_SITE_OTHER): Payer: Medicare Other | Admitting: Family Medicine

## 2013-11-19 ENCOUNTER — Encounter: Payer: Self-pay | Admitting: Family Medicine

## 2013-11-19 VITALS — BP 118/68 | HR 88 | Ht 75.0 in | Wt 285.0 lb

## 2013-11-19 DIAGNOSIS — Z23 Encounter for immunization: Secondary | ICD-10-CM

## 2013-11-19 DIAGNOSIS — R5383 Other fatigue: Secondary | ICD-10-CM | POA: Diagnosis not present

## 2013-11-19 DIAGNOSIS — Z125 Encounter for screening for malignant neoplasm of prostate: Secondary | ICD-10-CM

## 2013-11-19 DIAGNOSIS — E669 Obesity, unspecified: Secondary | ICD-10-CM | POA: Diagnosis not present

## 2013-11-19 DIAGNOSIS — L821 Other seborrheic keratosis: Secondary | ICD-10-CM | POA: Diagnosis not present

## 2013-11-19 DIAGNOSIS — Z0000 Encounter for general adult medical examination without abnormal findings: Secondary | ICD-10-CM

## 2013-11-19 DIAGNOSIS — I10 Essential (primary) hypertension: Secondary | ICD-10-CM | POA: Diagnosis not present

## 2013-11-19 DIAGNOSIS — R635 Abnormal weight gain: Secondary | ICD-10-CM | POA: Diagnosis not present

## 2013-11-19 DIAGNOSIS — G4733 Obstructive sleep apnea (adult) (pediatric): Secondary | ICD-10-CM | POA: Diagnosis not present

## 2013-11-19 DIAGNOSIS — L570 Actinic keratosis: Secondary | ICD-10-CM | POA: Diagnosis not present

## 2013-11-19 DIAGNOSIS — H9193 Unspecified hearing loss, bilateral: Secondary | ICD-10-CM

## 2013-11-19 DIAGNOSIS — Z136 Encounter for screening for cardiovascular disorders: Secondary | ICD-10-CM

## 2013-11-19 LAB — COMPLETE METABOLIC PANEL WITH GFR
ALBUMIN: 4.4 g/dL (ref 3.5–5.2)
ALK PHOS: 56 U/L (ref 39–117)
ALT: 35 U/L (ref 0–53)
AST: 40 U/L — ABNORMAL HIGH (ref 0–37)
BUN: 17 mg/dL (ref 6–23)
CALCIUM: 9.5 mg/dL (ref 8.4–10.5)
CHLORIDE: 103 meq/L (ref 96–112)
CO2: 24 mEq/L (ref 19–32)
Creat: 1.1 mg/dL (ref 0.50–1.35)
GFR, EST AFRICAN AMERICAN: 79 mL/min
GFR, EST NON AFRICAN AMERICAN: 68 mL/min
Glucose, Bld: 99 mg/dL (ref 70–99)
Potassium: 4.6 mEq/L (ref 3.5–5.3)
Sodium: 137 mEq/L (ref 135–145)
Total Bilirubin: 0.7 mg/dL (ref 0.2–1.2)
Total Protein: 6.6 g/dL (ref 6.0–8.3)

## 2013-11-19 LAB — LIPID PANEL
CHOLESTEROL: 137 mg/dL (ref 0–200)
HDL: 36 mg/dL — ABNORMAL LOW (ref >39)
LDL Cholesterol: 76 mg/dL (ref 0–99)
Total CHOL/HDL Ratio: 3.8 Ratio
Triglycerides: 123 mg/dL (ref <150)
VLDL: 25 mg/dL (ref 0–40)

## 2013-11-19 NOTE — Patient Instructions (Signed)
Dr. Malva Diesing's General Advice Following Your Complete Physical Exam  The Benefits of Regular Exercise: Unless you suffer from an uncontrolled cardiovascular condition, studies strongly suggest that regular exercise and physical activity will add to both the quality and length of your life.  The World Health Organization recommends 150 minutes of moderate intensity aerobic activity every week.  This is best split over 3-4 days a week, and can be as simple as a brisk walk for just over 35 minutes "most days of the week".  This type of exercise has been shown to lower LDL-Cholesterol, lower average blood sugars, lower blood pressure, lower cardiovascular disease risk, improve memory, and increase one's overall sense of wellbeing.  The addition of anaerobic (or "strength training") exercises offers additional benefits including but not limited to increased metabolism, prevention of osteoporosis, and improved overall cholesterol levels.  How Can I Strive For A Low-Fat Diet?: Current guidelines recommend that 25-35 percent of your daily energy (food) intake should come from fats.  One might ask how can this be achieved without having to dissect each meal on a daily basis?  Switch to skim or 1% milk instead of whole milk.  Focus on lean meats such as ground turkey, fresh fish, baked chicken, and lean cuts of beef as your source of dietary protein.  Limit saturated fat consumption to less than 10% of your daily caloric intake.  Limit trans fatty acid consumption primarily by limiting synthetic trans fats such as partially hydrogenated oils (Ex: fried fast foods).  Substitute olive or vegetable oil for solid fats where possible.  Moderation of Salt Intake: Provided you don't carry a diagnosis of congestive heart failure nor renal failure, I recommend a daily allowance of no more than 2300 mg of salt (sodium).  Keeping under this daily goal is associated with a decreased risk of cardiovascular events, creeping  above it can lead to elevated blood pressures and increases your risk of cardiovascular events.  Milligrams (mg) of salt is listed on all nutrition labels, and your daily intake can add up faster than you think.  Most canned and frozen dinners can pack in over half your daily salt allowance in one meal.    Lifestyle Health Risks: Certain lifestyle choices carry specific health risks.  As you may already know, tobacco use has been associated with increasing one's risk of cardiovascular disease, pulmonary disease, numerous cancers, among many other issues.  What you may not know is that there are medications and nicotine replacement strategies that can more than double your chances of successfully quitting.  I would be thrilled to help manage your quitting strategy if you currently use tobacco products.  When it comes to alcohol use, I've yet to find an "ideal" daily allowance.  Provided an individual does not have a medical condition that is exacerbated by alcohol consumption, general guidelines determine "safe drinking" as no more than two standard drinks for a man or no more than one standard drink for a male per day.  However, much debate still exists on whether any amount of alcohol consumption is technically "safe".  My general advice, keep alcohol consumption to a minimum for general health promotion.  If you or others believe that alcohol, tobacco, or recreational drug use is interfering with your life, I would be happy to provide confidential counseling regarding treatment options.  General "Over The Counter" Nutrition Advice: Postmenopausal women should aim for a daily calcium intake of 1200 mg, however a significant portion of this might already be   provided by diets including milk, yogurt, cheese, and other dairy products.  Vitamin D has been shown to help preserve bone density, prevent fatigue, and has even been shown to help reduce falls in the elderly.  Ensuring a daily intake of 800 Units of  Vitamin D is a good place to start to enjoy the above benefits, we can easily check your Vitamin D level to see if you'd potentially benefit from supplementation beyond 800 Units a day.  Folic Acid intake should be of particular concern to women of childbearing age.  Daily consumption of 400-800 mcg of Folic Acid is recommended to minimize the chance of spinal cord defects in a fetus should pregnancy occur.    For many adults, accidents still remain one of the most common culprits when it comes to cause of death.  Some of the simplest but most effective preventitive habits you can adopt include regular seatbelt use, proper helmet use, securing firearms, and regularly testing your smoke and carbon monoxide detectors.  Laconya Clere B. Delinda Malan DO [facility] [address], [address] [place], [place] [address] Phone: [phone]  

## 2013-11-19 NOTE — Addendum Note (Signed)
Addended by: Isaias Cowman C on: 11/19/2013 10:08 AM   Modules accepted: Orders

## 2013-11-19 NOTE — Progress Notes (Signed)
Subjective:    Jeremy Woodward is a 68 y.o. male who presents for Medicare Annual/Subsequent preventive examination.   Preventive Screening-Counseling & Management  Tobacco History  Smoking status  . Never Smoker   Smokeless tobacco  . Never Used    Comment: cigars   Colonoscopy: 11/15/10 Due 2017  Prostate: Discussed screening risks/beneifts with patient on 11/19/2013 requesting PSA   AAA Screen: not indicated   Influenza Vaccine: Will receive today Pneumovax: 23 given 2013, needs Prevnar, given today Td/Tdap: Tdap 2012 up-to-date Zoster: Given 2012   Problems Prior to Visit 1.HTN, HLD, obesity phentermine seems to be helping with weight loss, portion control without any noted side effects.  Current Problems (verified) Patient Active Problem List   Diagnosis Date Noted  . Insomnia 11/15/2013  . DDD (degenerative disc disease), cervical 08/02/2013  . Morbid obesity 05/24/2013  . Hearing loss 11/18/2012  . Diarrhea 03/20/2012  . Personal history of colonic polyps 03/20/2012  . Hypogonadism male 02/13/2012  . 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 Obesity 11/14/2011  . Low libido 11/06/2011  . Fatigue 11/06/2011  . Actinic keratosis 11/06/2011  . Nonspecific abnormal finding in stool contents 10/30/2010  . OSA on CPAP 10/23/2010  . CAD, NATIVE VESSEL 12/19/2008  . CHEST PAIN UNSPECIFIED 12/08/2008  . DYSPNEA ON EXERTION 08/31/2008  . Essential hypertension, benign 06/02/2008  . ADHESIVE CAPSULITIS OF SHOULDER 06/02/2008  . Headache 02/24/2007  . HYPOGONADISM 07/02/2006  . HYPERLIPIDEMIA 07/02/2006  . EXOGENOUS OBESITY 07/02/2006  . DEPRESSION 07/02/2006  . BENIGN PROSTATIC HYPERTROPHY 07/02/2006    Medications Prior to Visit Current Outpatient Prescriptions on File Prior to Visit  Medication Sig Dispense Refill  . aspirin 81 MG tablet Take 81 mg by mouth daily.        Marland Kitchen atorvastatin (LIPITOR) 40 MG tablet Take 1 tablet (40 mg total) by mouth daily at 6  PM.  90 tablet  3  . citalopram (CELEXA) 10 MG tablet TAKE 1 TABLET EVERY DAY AS NEEDED  30 tablet  4  . diphenoxylate-atropine (LOMOTIL) 2.5-0.025 MG per tablet Take 1 tablet by mouth 4 (four) times daily as needed for diarrhea or loose stools.  30 tablet  0  . losartan (COZAAR) 50 MG tablet Take 1 tablet (50 mg total) by mouth daily.  90 tablet  1  . naproxen sodium (ANAPROX) 550 MG tablet Take 550 mg by mouth as needed. For pain      . phentermine (ADIPEX-P) 37.5 MG tablet Take 1 tablet (37.5 mg total) by mouth daily before breakfast.  30 tablet  0  . zolpidem (AMBIEN) 10 MG tablet TAKE 1 TABLET BY MOUTH AT BEDTIME AS NEEDED FOR SLEEP  90 tablet  0   No current facility-administered medications on file prior to visit.    Current Medications (verified) Current Outpatient Prescriptions  Medication Sig Dispense Refill  . aspirin 81 MG tablet Take 81 mg by mouth daily.        Marland Kitchen atorvastatin (LIPITOR) 40 MG tablet Take 1 tablet (40 mg total) by mouth daily at 6 PM.  90 tablet  3  . citalopram (CELEXA) 10 MG tablet TAKE 1 TABLET EVERY DAY AS NEEDED  30 tablet  4  . diphenoxylate-atropine (LOMOTIL) 2.5-0.025 MG per tablet Take 1 tablet by mouth 4 (four) times daily as needed for diarrhea or loose stools.  30 tablet  0  . losartan (COZAAR) 50 MG tablet Take 1 tablet (50 mg total) by mouth daily.  90 tablet  1  . naproxen sodium (ANAPROX) 550 MG tablet Take 550 mg by mouth as needed. For pain      . phentermine (ADIPEX-P) 37.5 MG tablet Take 1 tablet (37.5 mg total) by mouth daily before breakfast.  30 tablet  0  . zolpidem (AMBIEN) 10 MG tablet TAKE 1 TABLET BY MOUTH AT BEDTIME AS NEEDED FOR SLEEP  90 tablet  0   No current facility-administered medications for this visit.     Allergies (verified) Topamax   PAST HISTORY  Family History Family History  Problem Relation Age of Onset  . Hyperlipidemia Sister   . Hypertension Sister   . Colon cancer Maternal Grandmother     late 73's when  dx   . Cancer Father 6    liver and lung  . Heart disease Mother   . Ulcers Mother   . Hypertension Mother   . Aneurysm Mother     Social History History  Substance Use Topics  . Smoking status: Never Smoker   . Smokeless tobacco: Never Used     Comment: cigars  . Alcohol Use: No    Are there smokers in your home (other than you)?  No  Risk Factors Current exercise habits: working in the yard, no formal routine  Dietary issues discussed: DASH Diet   Cardiac risk factors: advanced age (older than 62 for men, 55 for women), dyslipidemia, hypertension, obesity (BMI >= 30 kg/m2) and sedentary lifestyle.  Depression Screen (Note: if answer to either of the following is "Yes", a more complete depression screening is indicated)   Q1: Over the past two weeks, have you felt down, depressed or hopeless? No  Q2: Over the past two weeks, have you felt little interest or pleasure in doing things? No  Have you lost interest or pleasure in daily life? No  Do you often feel hopeless? No  Do you cry easily over simple problems? No  Activities of Daily Living In your present state of health, do you have any difficulty performing the following activities?:  Driving? No Managing money?  No Feeding yourself? No Getting from bed to chair? No Climbing a flight of stairs? No Preparing food and eating?: No Bathing or showering? No Getting dressed: No Getting to the toilet? No Using the toilet:No Moving around from place to place: No In the past year have you fallen or had a near fall?:No   Are you sexually active?  No  Do you have more than one partner?  No  Hearing Difficulties: Yes Do you often ask people to speak up or repeat themselves? Yes Do you experience ringing or noises in your ears? Yes Do you have difficulty understanding soft or whispered voices? Yes   Do you feel that you have a problem with memory? No  Do you often misplace items? No  Do you feel safe at home?   Yes  Cognitive Testing  Alert? Yes  Normal Appearance?Yes  Oriented to person? Yes  Place? Yes   Time? Yes  Recall of three objects?  Yes  Can perform simple calculations? Yes  Displays appropriate judgment?Yes  Can read the correct time from a watch face?Yes   Advanced Directives have been discussed with the patient? Yes   List the Names of Other Physician/Practitioners you currently use: 1.    Indicate any recent Medical Services you may have received from other than [facility] providers in the past year (date may be approximate).  Immunization History  Administered Date(s) Administered  .  Influenza Split 10/11/2010, 11/06/2011  . Influenza Whole 12/01/2008, 12/11/2009  . Pneumococcal Polysaccharide-23 11/14/2011  . Td 12/01/2008  . Tdap 10/11/2010  . Zoster 10/11/2010    Screening Tests Health Maintenance  Topic Date Due  . Influenza Vaccine  08/28/2013  . Colonoscopy  11/05/2015  . Tetanus/tdap  10/10/2020  . Pneumococcal Polysaccharide Vaccine Age 22 And Over  Completed  . Zostavax  Completed    All answers were reviewed with the patient and necessary referrals were made:  Marcial Pacas, DO   11/19/2013   History reviewed: allergies, current medications, past family history, past medical history, past social history, past surgical history and problem list  Review of Systems Review of Systems - General ROS: negative for - chills, fever, night sweats, weight gain or weight loss Ophthalmic ROS: negative for - decreased vision Psychological ROS: negative for - anxiety or depression ENT ROS: negative for -  nasal congestion, tinnitus or allergies Hematological and Lymphatic ROS: negative for - bleeding problems, bruising or swollen lymph nodes Breast ROS: negative Respiratory ROS: no cough, shortness of breath, or wheezing Cardiovascular ROS: no chest pain or dyspnea on exertion Gastrointestinal ROS: no abdominal pain, change in bowel habits, or black or bloody  stools Genito-Urinary ROS: negative for - genital discharge, genital ulcers, incontinence or abnormal bleeding from genitals Musculoskeletal ROS: negative for - joint pain or muscle pain Neurological ROS: negative for - headaches or memory loss Dermatological ROS: negative for lumps, mole changes, rash and skin lesion changes   Objective:     Vision by Snellen chart: right eye:20/20, left eye:20/20 Blood pressure 118/68, pulse 88, height 6\' 3"  (1.905 m), weight 285 lb (129.275 kg). Body mass index is 35.62 kg/(m^2).  General: No Acute Distress HEENT: Atraumatic, normocephalic, conjunctivae normal without scleral icterus.  No nasal discharge, hearing grossly intact, TMs with good landmarks bilaterally with no middle ear abnormalities, posterior pharynx clear without oral lesions. Neck: Supple, trachea midline, no cervical nor supraclavicular adenopathy. Pulmonary: Clear to auscultation bilaterally without wheezing, rhonchi, nor rales. Cardiac: Regular rate and rhythm.  No murmurs, rubs, nor gallops. No peripheral edema.  2+ peripheral pulses bilaterally. Abdomen: Bowel sounds normal.  No masses.  Non-tender without rebound.  Negative Murphy's sign. MSK: Grossly intact, no signs of weakness.  Full strength throughout upper and lower extremities.  Full ROM in upper and lower extremities.  No midline spinal tenderness. Neuro: Gait unremarkable, CN II-XII grossly intact.  C5-C6 Reflex 2/4 Bilaterally, L4 Reflex 2/4 Bilaterally.  Cerebellar function intact. Skin: No rashes. Psych: Alert and oriented to person/place/time.  Thought process normal. No anxiety/depression.   Assessment:     Due for flu and Prevnar, received today. Hearing loss requires followup testing with audiology referral has been placed.     Plan:     During the course of the visit the patient was educated and counseled about appropriate screening and preventive services including:    Pneumococcal vaccine   Influenza  vaccine  Prostate cancer screening  Diabetes screening  Nutrition counseling   Lipid panel complete metabolic panel PSA  Diet review for nutrition referral? Not indicated Patient Instructions (the written plan) was given to the patient.  Medicare Attestation I have personally reviewed: The patient's medical and social history Their use of alcohol, tobacco or illicit drugs Their current medications and supplements The patient's functional ability including ADLs,fall risks, home safety risks, cognitive, and hearing and visual impairment Diet and physical activities Evidence for depression or mood disorders  The patient's  weight, height, BMI, and visual acuity have been recorded in the chart.  I have made referrals, counseling, and provided education to the patient based on review of the above and I have provided the patient with a written personalized care plan for preventive services.     Marcial Pacas, DO   11/19/2013

## 2013-11-20 LAB — PSA: PSA: 1.26 ng/mL (ref <=4.00)

## 2013-12-01 ENCOUNTER — Encounter: Payer: Self-pay | Admitting: Family Medicine

## 2013-12-13 ENCOUNTER — Ambulatory Visit (INDEPENDENT_AMBULATORY_CARE_PROVIDER_SITE_OTHER): Payer: Medicare Other | Admitting: Family Medicine

## 2013-12-13 ENCOUNTER — Encounter: Payer: Self-pay | Admitting: Family Medicine

## 2013-12-13 VITALS — BP 134/73 | HR 83 | Wt 276.0 lb

## 2013-12-13 DIAGNOSIS — G4700 Insomnia, unspecified: Secondary | ICD-10-CM | POA: Diagnosis not present

## 2013-12-13 DIAGNOSIS — I10 Essential (primary) hypertension: Secondary | ICD-10-CM | POA: Diagnosis not present

## 2013-12-13 DIAGNOSIS — E669 Obesity, unspecified: Secondary | ICD-10-CM | POA: Diagnosis not present

## 2013-12-13 DIAGNOSIS — L570 Actinic keratosis: Secondary | ICD-10-CM | POA: Diagnosis not present

## 2013-12-13 MED ORDER — PHENTERMINE HCL 37.5 MG PO TABS: 37.5000 mg | ORAL_TABLET | Freq: Every day | ORAL | Status: DC

## 2013-12-13 NOTE — Progress Notes (Signed)
CC: Jeremy Woodward is a 68 y.o. male is here for wt and Bp check   Subjective: HPI:  Follow-up obesity: Continue to take phentermine on a daily basis for no known side effects. Reports improvement with portion control and decreased appetite. He believes he's lost about 10 pounds since we saw him last, scales would confirm this. He denies weight gain, anxiety, sleep disturbance, paranoia nor any mental disturbance.  Follow-up insomnia: Taking half an Ambien most nights of the week. On this regimen he reports complete resolution of insomnia. No difficulty falling asleep or staying asleep while taking medication. He'll occasionally not take this and have difficulty with staying asleep more than a few hours. Denies any known side effects, sleep walking.  Follow-up essential hypertension: He brings in blood pressures today showing consistent values in the normotensive and prehypertensive range. No chest pain shortness of breath orthopnea nor motor or sensory disturbances  Complains of a lesion on the crown of the head has been present for a few months. He states it occasionally bleeds. It waxes and wanes in size. It is painless without any discharge.   Review Of Systems Outlined In HPI  Past Medical History  Diagnosis Date  . Depression   . Hyperlipidemia   . Obesity   . BPH (benign prostatic hypertrophy)   . Hypertension   . CAD (coronary artery disease)   . OSA on CPAP   . Arthritis   . Diarrhea   . CHF (congestive heart failure)     Past Surgical History  Procedure Laterality Date  . Tonsillectomy    . Lung decortication  1996    right with drainage utilizing VAT for empyema   Family History  Problem Relation Age of Onset  . Hyperlipidemia Sister   . Hypertension Sister   . Colon cancer Maternal Grandmother     late 23's when dx   . Cancer Father 42    liver and lung  . Heart disease Mother   . Ulcers Mother   . Hypertension Mother   . Aneurysm Mother     History    Social History  . Marital Status: Married    Spouse Name: N/A    Number of Children: 1  . Years of Education: N/A   Occupational History  . Retired    Social History Main Topics  . Smoking status: Never Smoker   . Smokeless tobacco: Never Used     Comment: cigars  . Alcohol Use: No  . Drug Use: No  . Sexual Activity: Not on file   Other Topics Concern  . Not on file   Social History Narrative     Objective: BP 134/73 mmHg  Pulse 83  Wt 276 lb (125.193 kg)  General: Alert and Oriented, No Acute Distress HEENT: Pupils equal, round, reactive to light. Conjunctivae clear.  Wrist mucous membranes Unremarkable Lungs: Clear to auscultation bilaterally, no wheezing/ronchi/rales.  Comfortable work of breathing. Good air movement. Cardiac: Regular rate and rhythm. Normal S1/S2.  No murmurs, rubs, nor gallops.   Abdomen: Obese and soft. Extremities: No peripheral edema.  Strong peripheral pulses.  Mental Status: No depression, anxiety, nor agitation. Skin: Warm and dry. 4 mm diameter actinic keratosis on the crown of the head without any telangiectasia, this lesion is flat  Assessment & Plan: Jeremy Woodward was seen today for wt and bp check.  Diagnoses and associated orders for this visit:  Essential hypertension, benign  Insomnia  Obesity - phentermine (ADIPEX-P) 37.5 MG tablet;  Take 1 tablet (37.5 mg total) by mouth daily before breakfast.  Actinic keratosis    Essential hypertension: Controlled continue losartan Insomnia: Continue as needed Ambien Obesity: Improving, he would like a refill on phentermine which is still beneficial Actinic keratosis: Distracted today with cryotherapy. Will recheck at follow-up visit   Return in about 4 weeks (around 01/10/2014).   Cryotherapy Procedure Note  Pre-operative Diagnosis: Actinic keratosis  Post-operative Diagnosis: Actinic keratosis  Locations: crown of scalp  Indications: premalignant lesion  Anesthesia:   none  Procedure Details  History of allergy to iodine: no. Pacemaker? no.  Patient informed of risks (permanent scarring, infection, light or dark discoloration, bleeding, infection, weakness, numbness and recurrence of the lesion) and benefits of the procedure and verbal informed consent obtained.  The areas are treated with liquid nitrogen therapy, frozen until ice ball extended 2 mm beyond lesion, allowed to thaw, and treated again. The patient tolerated procedure well.  The patient was instructed on post-op care, warned that there may be blister formation, redness and pain. Recommend OTC analgesia as needed for pain.  Condition: Stable  Complications: none.  Plan: 1. Instructed to keep the area dry and covered for 24-48h and clean thereafter. 2. Warning signs of infection were reviewed.   3. Recommended that the patient use OTC analgesics as needed for pain.  4. Return in 1 month.

## 2013-12-29 ENCOUNTER — Ambulatory Visit: Payer: Medicare Other | Attending: Family Medicine | Admitting: Audiology

## 2013-12-29 DIAGNOSIS — H9193 Unspecified hearing loss, bilateral: Secondary | ICD-10-CM | POA: Insufficient documentation

## 2013-12-29 DIAGNOSIS — H93293 Other abnormal auditory perceptions, bilateral: Secondary | ICD-10-CM

## 2013-12-29 DIAGNOSIS — H93213 Auditory recruitment, bilateral: Secondary | ICD-10-CM | POA: Insufficient documentation

## 2013-12-29 DIAGNOSIS — H903 Sensorineural hearing loss, bilateral: Secondary | ICD-10-CM | POA: Insufficient documentation

## 2013-12-29 NOTE — Patient Instructions (Signed)
Auditory processing self-help computer programs are now available for IPAD and computer download, more are being developed.  Benefit has been shown with intensive use for 10-15 minutes,  4-5 days per week. Research is suggesting that using the programs for a short amount of time each day is better for the auditory processing development than completing the program in a short amount of time by doing it several hours per day. Auditory Workout          IPAD only from [employer]  (teenage to adult)     PC download or cd                                                    www.neurotone.com   Other self-help measures include: 1) have conversation face to face  2) minimize background noise when having a conversation- turn off the TV, move to a quiet area of the area 3) be aware that auditory processing problems become worse with fatigue and stress  4) Avoid having important conversation in the kitchen, especially when the water is running, water is boiling and your back is to the speaker.   Phineas Mcenroe L. Heide Spark, Au.D., CCC-A Doctor of Audiology

## 2013-12-29 NOTE — Procedures (Signed)
[facility] [address] [place], [place] [address] [place] EVALUATION  Name: Jeremy Woodward DOB:  [birth year] MRN:  [id number]                                 Diagnosis: Hearing loss, tinnitus Date: 12/29/2013     Referent: Marcial Pacas, DO  HISTORY: Jeremy Woodward, age 68 y.o. years, was seen for an audiological evaluation.  He reports increased difficulty with communication especially in background noise or with multiple speakers, difficulty with sound localization and loudness issues (ie church, some loud voices) as well as difficulty understanding in areas with reverberation or poor acoustics.  Ms. Muse has a significant noise exposure history including being in the [facility] from [place].   Jeremy Woodward also notices occasional tinnitus in both ears that sounds like "a whistle". He reports a history of "left punctured eardrum with bilateral ear pain and previous ear infection".   He has intermittent balance issues that include "feeling off-balance or lightheaded".   EVALUATION: Pure tone air and tone conduction was completed using conventional audiometry with inserts. Hearing thresholds are 15-30 dBHL from 250Hz  - 2000Hz ; 55 dBHL at 4000Hz ; and 70-75 dBHL at 8000Hz . Bone conduction results are predominantly sensorineural. Speech reception thresholds are 25 dBHL in the right ear and 25 dBHL in the left ear using recorded spondee words.  The reliability is good. Word recognition is 100% at 65dBHL in the right and 100% at 65dBHL in the left using recorded NU-6 word lists in quiet. In minimal background noise with +5dB signal to noise ratio word recognition is 64 % in the right ear and 46% in the left ear. Otoscopic inspection non-occluding ear wax bilaterally with visible tympanic membranes without redness.  Tympanometry showed normal middle ear function bilaterally (Type A).  Ipsilateral acoustic reflexes are present bilaterally and within  normal limits with a slightly elevated ipsilateral acoustic reflex at 4000Hz  in the left ear only.  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  The hearing loss is predominantly sensorineural.  Word recognition is excellent in quiet at conversational speech levels in each ear.  In minimal background noise, word recognition drops to  poor in the right ear and drops to very poor in the left ear.   There is a complaint about loudness and moderate recruitment is verified with today's testing.  Jeremy Woodward may be an excellent candidate for amplification therefore a hearing aid evaluation is recommended.  In addition, Jeremy Woodward may benefit from the use of a computer program to be used at home to improve auditory processing function.  LACE by www.neurotone or Auditory Workout for apple products were recommended to improve hearing in increasing background noise.  Using one of these programs 10-15 minutes per day 4-5 days per week is strongly recommended. To monitor progress use the hearing test at www.hear-it.com before, during a few weeks of using the auditory processing programs and then after completion -- it has been studied and found to be a valid measure.  RECOMMENDATIONS: 1. A hearing aid evaluation. This may be completed privately but Jeremy Woodward also plans to contact the [facility] to determine benefits. 2.  Self-help measures include: 1) have conversation face  to face  2) minimize background noise when having a conversation- turn off the TV, move to a quiet area of the area 3) be aware that auditory processing problems become worse with fatigue and stress  4) Avoid having important conversation in the kitchen, especially when the water is running, water is boiling and your back is to the speaker.  3. To improve  listening in background noise, consider the use of the at home computer programs that help improve hearing in background noise, focusing particularly on the left ear. Auditory processing self-help computer programs are available for IPAD and computer download.  Benefit has been shown with intensive use for 10-15 minutes,  4-5 days per week. Research is suggesting that using the programs for a short amount of time each day is better for the auditory processing development than completing the program in a short amount of time by doing it several hours per day. Auditory Workout          IPAD only from [employer]  (teenage to adult)     PC download or cd                                                   www.neurotone.com 4.   To minimize the adverse effects of tinnitus 1) avoid quiet  2) use noise maskers at home such as a sound machine, quiet music, a fan or other background noise at a volume just loud enough to mask the high pitched tinnitus. 3) If the tinnitus becomes more bothersome, adversely affecting your sleep or concentration, contact your physician,  seek additional medical help by an ENT for further treatment of your tinnitus.  5.   Regarding reports of feelings "off balance", consider a balance assessment by physical therapy who would also be able to provide therapy - available at [facility] located at [place].   Anan Dapolito L. Heide Spark, Au.D., CCC-A Doctor of Audiology   12/29/2013  cc: Marcial Pacas, DO

## 2014-01-06 ENCOUNTER — Encounter (HOSPITAL_COMMUNITY): Payer: Self-pay | Admitting: Cardiology

## 2014-01-10 ENCOUNTER — Ambulatory Visit (INDEPENDENT_AMBULATORY_CARE_PROVIDER_SITE_OTHER): Payer: Medicare Other | Admitting: Family Medicine

## 2014-01-10 ENCOUNTER — Encounter: Payer: Self-pay | Admitting: Family Medicine

## 2014-01-10 VITALS — BP 128/74 | HR 81 | Ht 74.0 in | Wt 271.0 lb

## 2014-01-10 DIAGNOSIS — E669 Obesity, unspecified: Secondary | ICD-10-CM

## 2014-01-10 DIAGNOSIS — L570 Actinic keratosis: Secondary | ICD-10-CM | POA: Diagnosis not present

## 2014-01-10 DIAGNOSIS — I2510 Atherosclerotic heart disease of native coronary artery without angina pectoris: Secondary | ICD-10-CM

## 2014-01-10 DIAGNOSIS — I10 Essential (primary) hypertension: Secondary | ICD-10-CM | POA: Diagnosis not present

## 2014-01-10 MED ORDER — PHENTERMINE HCL 37.5 MG PO TABS: 37.5000 mg | ORAL_TABLET | Freq: Every day | ORAL | Status: DC

## 2014-01-10 NOTE — Progress Notes (Signed)
CC: Jeremy Woodward is a 68 y.o. male is here for Follow-up   Subjective: HPI:  At his last visit he had a lesion that looked to be actinic keratosis that was treated with cryotherapy. He states that it is no longer causing him any discomfort and has not bled like it did before the cryotherapy.  Follow-up essential hypertension: No outside blood pressures to report continues on losartan on daily basis.  Follow-up obesity: He's cut back on his physical activity over the past month. He continues to take phentermine and believes it is providing benefit with appetite suppression however it's affect is not as robust as it was for the first month. He reports some mild subjective anxiety about large crowds and driving since starting this medication but no other known side effects. Denies sleep disturbance or any other mental disturbance.   Review Of Systems Outlined In HPI  Past Medical History  Diagnosis Date  . Depression   . Hyperlipidemia   . Obesity   . BPH (benign prostatic hypertrophy)   . Hypertension   . CAD (coronary artery disease)   . OSA on CPAP   . Arthritis   . Diarrhea   . CHF (congestive heart failure)     Past Surgical History  Procedure Laterality Date  . Tonsillectomy    . 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 Left and right heart catheterization with coronary angiogram N/A 03/05/2011    Procedure: LEFT AND RIGHT HEART CATHETERIZATION WITH CORONARY ANGIOGRAM;  Surgeon: Larey Dresser, MD;  Location: [facility] CATH LAB;  Service: Cardiovascular;  Laterality: N/A;   Family History  Problem Relation Age of Onset  . Hyperlipidemia Sister   . Hypertension Sister   . Colon cancer Maternal Grandmother     late 75's when dx   . Cancer Father 44    liver and lung  . Heart disease Mother   . Ulcers Mother   . Hypertension Mother   . Aneurysm Mother     History   Social History  . Marital Status: Married    Spouse Name: N/A   Number of Children: 1  . Years of Education: N/A   Occupational History  . Retired    Social History Main Topics  . Smoking status: Never Smoker   . Smokeless tobacco: Never Used     Comment: cigars  . Alcohol Use: No  . Drug Use: No  . Sexual Activity: Not on file   Other Topics Concern  . Not on file   Social History Narrative     Objective: BP 128/74 mmHg  Pulse 81  Ht 6\' 2"  (1.88 m)  Wt 271 lb (122.925 kg)  BMI 34.78 kg/m2  General: Alert and Oriented, No Acute Distress HEENT: Pupils equal, round, reactive to light. Conjunctivae clear.  Well-healed cryotherapy site on the crown of the head with a 1 mm diameter scab without inflammation or signs of infection Lungs: Clear to auscultation bilaterally, no wheezing/ronchi/rales.  Comfortable work of breathing. Good air movement. Cardiac: Regular rate and rhythm. Normal S1/S2.  No murmurs, rubs, nor gallops.   Abdomen: Moderately obese Extremities: No peripheral edema.  Strong peripheral pulses.  Mental Status: No depression, anxiety, nor agitation. Skin: Warm and dry.  Assessment & Plan: Jeremy Woodward was seen today for follow-up.  Diagnoses and associated orders for this visit:  Actinic keratosis  Essential hypertension, benign  Obesity - phentermine (ADIPEX-P) 37.5 MG tablet; Take 1  tablet (37.5 mg total) by mouth daily before breakfast.    actinic keratosis: Treated and resolved Essential hypertension: Controlled continue losartan Obesity: Slowly improving, time was taken to discuss dietary interventions and also ways to increase physical activity even if it is a light walking regimen most days of the week. Refill phentermine.  25 minutes spent face-to-face during visit today of which at least 50% was counseling or coordinating care regarding: 1. Actinic keratosis   2. Essential hypertension, benign   3. Obesity       Return in about 4 weeks (around 02/07/2014) for Nurse Visit BP/Weight Check.

## 2014-01-11 ENCOUNTER — Telehealth: Payer: Self-pay | Admitting: *Deleted

## 2014-01-11 NOTE — Telephone Encounter (Signed)
Pt left a message stating his insurance (medicare) didn't cover the labs and the injection given at his wellness exam on 11/19/2013. He said he thought it would be coded as diagnostic instead of preventative.It is my understanding that a wellness exam is preventative and not diagnostic so this would be correct. Please advise patient. He sounded a bit upset

## 2014-01-12 NOTE — Telephone Encounter (Signed)
Noted; pt was notified per Abigail Butts

## 2014-01-12 NOTE — Telephone Encounter (Signed)
I sent an e-mail to [place] in billing to find out about his bill. I don't see where the bill has been denied.   Once I have a response from [place], I will let you know the outcome.  As far as a lab bill, I don't have access to the lab system.  Mr. Cordial will need to fax or bring the bill to me so that I can research it for him.

## 2014-01-13 NOTE — Telephone Encounter (Signed)
I spoke with Jeremy Woodward in billing and the charges have been re-filed correctly to Insurance.  In the system, BCBS was listed as Primary and Medicare Secondary.  They have corrected the Insurance and will re-file the claim.  I also called [facility] for the patient and had 3 diagnosis codes added so that they can re-file the claim with the added diagnosis.  I called and spoke with Jeremy Woodward, he is aware of all the changes made and I told him that if he received another bill to call me directly.

## 2014-01-28 ENCOUNTER — Encounter: Payer: Self-pay | Admitting: Family Medicine

## 2014-01-28 DIAGNOSIS — H538 Other visual disturbances: Secondary | ICD-10-CM

## 2014-01-31 DIAGNOSIS — G4733 Obstructive sleep apnea (adult) (pediatric): Secondary | ICD-10-CM | POA: Diagnosis not present

## 2014-02-07 ENCOUNTER — Ambulatory Visit (INDEPENDENT_AMBULATORY_CARE_PROVIDER_SITE_OTHER): Payer: Medicare Other | Admitting: Family Medicine

## 2014-02-07 VITALS — BP 138/78 | HR 90 | Wt 271.0 lb

## 2014-02-07 DIAGNOSIS — E669 Obesity, unspecified: Secondary | ICD-10-CM

## 2014-02-07 NOTE — Progress Notes (Signed)
Patient advised of recommendations.  

## 2014-02-07 NOTE — Progress Notes (Signed)
   Subjective:    Patient ID: Jeremy Woodward, male    DOB: 02/17/1945, 69 y.o.   MRN: [id number]  HPI  Jeremy Woodward is here for a weight and blood pressure check. He reports change in his sleep. He is sleeping about 4 hours a night. He has also notice palpitations and decreased social interest.   Review of Systems     Objective:   Physical Exam        Assessment & Plan:  Obesity - weight has not changed

## 2014-02-07 NOTE — Progress Notes (Signed)
Levada Dy, Will you please let patient know that it appears phentermine has become ineffective at helping with weight loss.  Plus it sounds like it's causing more side effects than any real benefit.  I'd recommend stopping phentermine and for him to consider alternative weight loss medication such as Belviq or Contrave.  I'd recommend he look up these medications online and if he'd like to start either of them come see me and we'll go over risks and benefits.

## 2014-02-07 NOTE — Progress Notes (Signed)
Left detailed message.   

## 2014-02-15 MED ORDER — BUPROPION HCL 100 MG PO TABS: 100.0000 mg | ORAL_TABLET | Freq: Two times a day (BID) | ORAL | Status: DC

## 2014-02-15 NOTE — Addendum Note (Signed)
Addended by: Marcial Pacas on: 02/15/2014 07:57 AM   Modules accepted: Orders

## 2014-03-14 ENCOUNTER — Encounter: Payer: Self-pay | Admitting: Family Medicine

## 2014-03-15 MED ORDER — BUPROPION HCL 100 MG PO TABS: ORAL_TABLET | ORAL | Status: DC

## 2014-04-11 ENCOUNTER — Other Ambulatory Visit: Payer: Self-pay | Admitting: *Deleted

## 2014-04-11 MED ORDER — LOSARTAN POTASSIUM 50 MG PO TABS: 50.0000 mg | ORAL_TABLET | Freq: Every day | ORAL | Status: DC

## 2014-04-13 ENCOUNTER — Ambulatory Visit (INDEPENDENT_AMBULATORY_CARE_PROVIDER_SITE_OTHER): Payer: Medicare Other | Admitting: Cardiology

## 2014-04-13 ENCOUNTER — Encounter: Payer: Self-pay | Admitting: Cardiology

## 2014-04-13 VITALS — BP 136/82 | HR 71 | Ht 74.0 in | Wt 272.0 lb

## 2014-04-13 DIAGNOSIS — I2510 Atherosclerotic heart disease of native coronary artery without angina pectoris: Secondary | ICD-10-CM

## 2014-04-13 DIAGNOSIS — I10 Essential (primary) hypertension: Secondary | ICD-10-CM

## 2014-04-13 NOTE — Assessment & Plan Note (Signed)
Continue aspirin 

## 2014-04-13 NOTE — Progress Notes (Signed)
HPI: FU coronary artery disease. History of empyema and VATS/decortication, HTN, and long-standing exertional shortness of breath. Abd ultrasound 2010 with no aneurysm. CTA 2010 showed no pulmonary embolus. Left and right heart catheterization in 11/10 showed normal left and right heart filling pressures and no pulmonary hypertension; 50% distal left main stenosis and 50% proximal LAD stenosis. IVUS and FFR were done of both lesions, and it appeared that both were not hemodynamically significant and not likely to be the cause of his symptoms. He had a cardiopulmonary stress test in 12/10 to try to elucidate the cause of his shortness of breath. This was actually suggestive of possible ischemia. Therefore, he did an ETT-myoview in 12/10 as well. He became quite short of breath but exercised 8 minutes with no ECG changes and no evidence for ischemia or infarction. It was decided to continue medical management. He had an ETT-myoview a year later in 12/11, showing no ischemia or infarction. Patient reported somewhat increased exertional dyspnea as well as occasional chest pain episodes, so ETT was done in 1/13, showing 6:20 exercise with discontinuation due to profound dyspnea and some chest pain. Given very severe symptoms with ETT and progressive symptoms, LHC and RHC repeated and showed normal right and left heart filling pressure. LHC showed stable to improved coronary disease, with 40% distal LM and 40-50% proximal LAD. Since last seen, the patient has dyspnea with more extreme activities but not with routine activities. It is relieved with rest. It is not associated with chest pain. There is no orthopnea, PND or pedal edema. There is no syncope or palpitations. There is no exertional chest pain.   Current Outpatient Prescriptions  Medication Sig Dispense Refill  . aspirin 81 MG tablet Take 81 mg by mouth daily.      Marland Kitchen atorvastatin (LIPITOR) 40 MG tablet Take 1 tablet (40 mg total) by mouth daily at  6 PM. 90 tablet 3  . diphenoxylate-atropine (LOMOTIL) 2.5-0.025 MG per tablet Take 1 tablet by mouth 4 (four) times daily as needed for diarrhea or loose stools. 30 tablet 0  . buPROPion (WELLBUTRIN) 100 MG tablet Wean off medication by taking only one daily for five days. 5 tablet 0  . citalopram (CELEXA) 10 MG tablet TAKE 1 TABLET EVERY DAY AS NEEDED 30 tablet 4  . losartan (COZAAR) 50 MG tablet Take 1 tablet (50 mg total) by mouth daily. 90 tablet 0  . zolpidem (AMBIEN) 10 MG tablet TAKE 1 TABLET BY MOUTH AT BEDTIME AS NEEDED FOR SLEEP 90 tablet 0   No current facility-administered medications for this visit.     Past Medical History  Diagnosis Date  . Depression   . Hyperlipidemia   . Obesity   . BPH (benign prostatic hypertrophy)   . Hypertension   . CAD (coronary artery disease)   . OSA on CPAP   . Arthritis   . Diarrhea   . CHF (congestive heart failure)     Past Surgical History  Procedure Laterality Date  . Tonsillectomy    . 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 Left and right heart catheterization with coronary angiogram N/A 03/05/2011    Procedure: LEFT AND RIGHT HEART CATHETERIZATION WITH CORONARY ANGIOGRAM;  Surgeon: Larey Dresser, MD;  Location: [facility] CATH LAB;  Service: Cardiovascular;  Laterality: N/A;    History   Social History  . Marital Status: Married    Spouse Name: N/A  .  Number of Children: 1  . Years of Education: N/A   Occupational History  . Retired    Social History Main Topics  . Smoking status: Never Smoker   . Smokeless tobacco: Never Used     Comment: cigars  . Alcohol Use: No  . Drug Use: No  . Sexual Activity: Not on file   Other Topics Concern  . Not on file   Social History Narrative    ROS: no fevers or chills, productive cough, hemoptysis, dysphasia, odynophagia, melena, hematochezia, dysuria, hematuria, rash, seizure activity, orthopnea, PND, pedal edema, claudication. Remaining  systems are negative.  Physical Exam: Well-developed obese in no acute distress.  Skin is warm and dry.  HEENT is normal.  Neck is supple.  Chest is clear to auscultation with normal expansion.  Cardiovascular exam is regular rate and rhythm. 2/6 systolic murmur Abdominal exam nontender or distended. No masses palpated. Extremities show no edema. neuro grossly intact  ECG sinus rhythm at a rate of 71. No ST changes.

## 2014-04-13 NOTE — Assessment & Plan Note (Signed)
Patient is describing some myalgias in his lower extremities. Question related to Lipitor. I will discontinue. In 6 weeks if his symptoms have not improved we will resume. If they have we'll try a different statin.

## 2014-04-13 NOTE — Patient Instructions (Signed)
Your physician wants you to follow-up in: [place] will receive a reminder letter in the mail two months in advance. If you don't receive a letter, please call our office to schedule the follow-up appointment.   STOP ATORVASTATIN  IF NO CHANGE IN SYMPTOMS IN 6 WEEKS= RESTART= IF SYMPTOMS IMPROVE PLEASE CALL

## 2014-04-13 NOTE — Assessment & Plan Note (Signed)
Blood pressure controlled. Continue present medications. 

## 2014-04-14 ENCOUNTER — Other Ambulatory Visit: Payer: Self-pay | Admitting: Family Medicine

## 2014-04-14 ENCOUNTER — Other Ambulatory Visit: Payer: Self-pay | Admitting: Cardiology

## 2014-05-06 ENCOUNTER — Other Ambulatory Visit: Payer: Self-pay

## 2014-05-06 NOTE — Telephone Encounter (Signed)
HYPERLIPIDEMIA - Lelon Perla, MD at 04/13/2014 2:38 PM     Status: Written Related Problem: HYPERLIPIDEMIA   Expand All Collapse All   Patient is describing some myalgias in his lower extremities. Question related to Lipitor. I will discontinue. In 6 weeks if his symptoms have not improved we will resume. If they have we'll try a different statin

## 2014-05-10 ENCOUNTER — Other Ambulatory Visit: Payer: Self-pay

## 2014-05-10 MED ORDER — ATORVASTATIN CALCIUM 40 MG PO TABS: 40.0000 mg | ORAL_TABLET | Freq: Every day | ORAL | Status: DC

## 2014-05-24 ENCOUNTER — Other Ambulatory Visit: Payer: Self-pay | Admitting: Family Medicine

## 2014-05-26 ENCOUNTER — Other Ambulatory Visit: Payer: Self-pay | Admitting: Family Medicine

## 2014-05-30 ENCOUNTER — Encounter: Payer: Self-pay | Admitting: Family Medicine

## 2014-06-13 ENCOUNTER — Encounter: Payer: Self-pay | Admitting: Cardiology

## 2014-06-18 ENCOUNTER — Other Ambulatory Visit: Payer: Self-pay | Admitting: Family Medicine

## 2014-06-20 ENCOUNTER — Telehealth: Payer: Self-pay | Admitting: Cardiology

## 2014-06-20 DIAGNOSIS — E785 Hyperlipidemia, unspecified: Secondary | ICD-10-CM

## 2014-06-20 DIAGNOSIS — Z79899 Other long term (current) drug therapy: Secondary | ICD-10-CM

## 2014-06-20 NOTE — Telephone Encounter (Signed)
No answer when dialed. 

## 2014-06-20 NOTE — Telephone Encounter (Signed)
Pt says he is retuning message from Debra,concerning his E-mail.

## 2014-06-21 MED ORDER — PRAVASTATIN SODIUM 40 MG PO TABS: 40.0000 mg | ORAL_TABLET | Freq: Every evening | ORAL | Status: DC

## 2014-06-21 NOTE — Telephone Encounter (Signed)
Returned call to patient no answer.LMTC. 

## 2014-06-21 NOTE — Telephone Encounter (Signed)
Received call from patient he received email from [facility] and wanted to ask Dr.Crenshaw if he needed a lipid panel before he starts pravastatin.Also he took pravastatin in the past and it did not bring down cholesterol.Stated he does not remember the mg.Message sent to [facility] for advice.

## 2014-06-21 NOTE — Telephone Encounter (Signed)
Called patient, notified him of Dr. Jacalyn Lefevre instructions and recommended med changes - he voiced understanding. Orders submitted for his preferred lab and pharmacy. Pt expressed thanks for the call.

## 2014-06-21 NOTE — Telephone Encounter (Signed)
Would begin pravachol and check lipids and liver six weeks later Kirk Ruths

## 2014-06-21 NOTE — Telephone Encounter (Signed)
pravachol 40 mg daily Kirk Ruths

## 2014-06-21 NOTE — Telephone Encounter (Signed)
Dose for pravachol unspecified - OK for pravachol 80mg  daily?

## 2014-07-18 ENCOUNTER — Other Ambulatory Visit: Payer: Self-pay | Admitting: Cardiology

## 2014-07-30 ENCOUNTER — Other Ambulatory Visit: Payer: Self-pay | Admitting: Family Medicine

## 2014-08-12 DIAGNOSIS — H43812 Vitreous degeneration, left eye: Secondary | ICD-10-CM | POA: Diagnosis not present

## 2014-08-23 ENCOUNTER — Other Ambulatory Visit: Payer: Self-pay | Admitting: Family Medicine

## 2014-08-23 NOTE — Telephone Encounter (Signed)
Andrea, Rx placed in in-box ready for pickup/faxing.  

## 2014-09-15 ENCOUNTER — Encounter: Payer: Self-pay | Admitting: Family Medicine

## 2014-09-20 ENCOUNTER — Ambulatory Visit (INDEPENDENT_AMBULATORY_CARE_PROVIDER_SITE_OTHER): Payer: Medicare Other | Admitting: Family Medicine

## 2014-09-20 VITALS — BP 136/78 | HR 74 | Wt 275.0 lb

## 2014-09-20 DIAGNOSIS — E785 Hyperlipidemia, unspecified: Secondary | ICD-10-CM | POA: Diagnosis not present

## 2014-09-20 DIAGNOSIS — R635 Abnormal weight gain: Secondary | ICD-10-CM | POA: Diagnosis not present

## 2014-09-20 DIAGNOSIS — Z79899 Other long term (current) drug therapy: Secondary | ICD-10-CM | POA: Diagnosis not present

## 2014-09-20 MED ORDER — PHENTERMINE HCL 37.5 MG PO TABS: 37.5000 mg | ORAL_TABLET | Freq: Every day | ORAL | Status: DC

## 2014-09-20 NOTE — Progress Notes (Signed)
   Subjective:    Patient ID: Jeremy Woodward, male    DOB: Jan 25, 1946, 69 y.o.   MRN: [id number]  HPI  Patient is here for blood pressure and weight check. Pt would like to begin Phentermine and needed a baseline weight recorded in the system. Pt states he has taken this Rx before and had no negative side effects from it.  Review of Systems     Objective:   Physical Exam        Assessment & Plan:  Patient now has a baseline weight entered into the system. An Rx for Phentermine will be sent to patient preferred pharmacy ([facility] on [address]). Patient advised to schedule a four week nurse visit and keep his upcoming appointment with her PCP. Verbalized understanding, no further questions.

## 2014-09-20 NOTE — Progress Notes (Signed)
Andrea, Rx placed in in-box ready for pickup/faxing.  

## 2014-09-20 NOTE — Progress Notes (Signed)
Rx has been faxed to Pt preferred pharmacy.

## 2014-09-21 LAB — LIPID PANEL
CHOL/HDL RATIO: 4.4 ratio (ref <=5.0)
Cholesterol: 173 mg/dL (ref 125–200)
HDL: 39 mg/dL — AB (ref >=40)
LDL Cholesterol: 96 mg/dL (ref <130)
Triglycerides: 192 mg/dL — ABNORMAL HIGH (ref <150)
VLDL: 38 mg/dL — AB (ref <30)

## 2014-09-21 LAB — HEPATIC FUNCTION PANEL
ALT: 29 U/L (ref 9–46)
AST: 24 U/L (ref 10–35)
Albumin: 4.5 g/dL (ref 3.6–5.1)
Alkaline Phosphatase: 54 U/L (ref 40–115)
BILIRUBIN TOTAL: 0.6 mg/dL (ref 0.2–1.2)
Bilirubin, Direct: 0.1 mg/dL (ref <=0.2)
Indirect Bilirubin: 0.5 mg/dL (ref 0.2–1.2)
Total Protein: 6.8 g/dL (ref 6.1–8.1)

## 2014-10-24 ENCOUNTER — Ambulatory Visit (INDEPENDENT_AMBULATORY_CARE_PROVIDER_SITE_OTHER): Payer: Medicare Other | Admitting: Family Medicine

## 2014-10-24 ENCOUNTER — Encounter: Payer: Self-pay | Admitting: Family Medicine

## 2014-10-24 VITALS — BP 132/66 | HR 80 | Wt 265.0 lb

## 2014-10-24 DIAGNOSIS — I2510 Atherosclerotic heart disease of native coronary artery without angina pectoris: Secondary | ICD-10-CM | POA: Diagnosis not present

## 2014-10-24 DIAGNOSIS — G4700 Insomnia, unspecified: Secondary | ICD-10-CM

## 2014-10-24 DIAGNOSIS — R635 Abnormal weight gain: Secondary | ICD-10-CM

## 2014-10-24 MED ORDER — ZOLPIDEM TARTRATE 10 MG PO TABS: 10.0000 mg | ORAL_TABLET | Freq: Every evening | ORAL | Status: DC | PRN

## 2014-10-24 MED ORDER — PHENTERMINE HCL 37.5 MG PO TABS: 37.5000 mg | ORAL_TABLET | Freq: Every day | ORAL | Status: DC

## 2014-10-24 MED ORDER — CITALOPRAM HYDROBROMIDE 10 MG PO TABS: ORAL_TABLET | ORAL | Status: DC

## 2014-10-24 NOTE — Patient Instructions (Signed)
Start either probiotics or Metamucil on a daily basis to help with bloating.

## 2014-10-24 NOTE — Progress Notes (Signed)
CC: Jeremy Woodward is a 69 y.o. male is here for wt check and Cough   Subjective: HPI:  Requesting a refill on phentermine. His feeling like this medicine is helping with reducing his portions during all meals. He is no longer snacking as much. He is also feeling like he's been more active since trying to aggressively lose weight. His tracking his footsteps every day.  Denies any known side effects other than some sweating since taking phentermine. Denies sleep disturbance or any mental disturbance.  Continues to have difficulty with staying asleep at night, no difficulty with falling asleep. He's currently taking 5 mg of Ambien on a nightly basis. No known side effects or sleepwalking.   Review Of Systems Outlined In HPI  Past Medical History  Diagnosis Date  . Depression   . Hyperlipidemia   . Obesity   . BPH (benign prostatic hypertrophy)   . Hypertension   . CAD (coronary artery disease)   . OSA on CPAP   . Arthritis   . Diarrhea   . CHF (congestive heart failure)     Past Surgical History  Procedure Laterality Date  . Tonsillectomy    . 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 Left and right heart catheterization with coronary angiogram N/A 03/05/2011    Procedure: LEFT AND RIGHT HEART CATHETERIZATION WITH CORONARY ANGIOGRAM;  Surgeon: Larey Dresser, MD;  Location: [facility] CATH LAB;  Service: Cardiovascular;  Laterality: N/A;   Family History  Problem Relation Age of Onset  . Hyperlipidemia Sister   . Hypertension Sister   . Colon cancer Maternal Grandmother     late 76's when dx   . Cancer Father 77    liver and lung  . Heart disease Mother   . Ulcers Mother   . Hypertension Mother   . Aneurysm Mother     Social History   Social History  . Marital Status: Married    Spouse Name: N/A  . Number of Children: 1  . Years of Education: N/A   Occupational History  . Retired    Social History Main Topics  . Smoking status:  Never Smoker   . Smokeless tobacco: Never Used     Comment: cigars  . Alcohol Use: No  . Drug Use: No  . Sexual Activity: Not on file   Other Topics Concern  . Not on file   Social History Narrative     Objective: BP 132/66 mmHg  Pulse 80  Wt 265 lb (120.203 kg)  Vital signs reviewed. General: Alert and Oriented, No Acute Distress HEENT: Pupils equal, round, reactive to light. Conjunctivae clear.  External ears unremarkable.  Moist mucous membranes. Lungs: Clear and comfortable work of breathing, speaking in full sentences without accessory muscle use. Cardiac: Regular rate and rhythm.  Neuro: CN II-XII grossly intact, gait normal. Extremities: No peripheral edema.  Strong peripheral pulses.  Mental Status: No depression, anxiety, nor agitation. Logical though process. Skin: Warm and dry.  Assessment & Plan: Jeremy Woodward was seen today for wt check and cough.  Diagnoses and all orders for this visit:  Abnormal weight gain  Insomnia  Other orders -     Discontinue: phentermine (ADIPEX-P) 37.5 MG tablet; Take 1 tablet (37.5 mg total) by mouth daily before breakfast. -     citalopram (CELEXA) 10 MG tablet; TAKE 1 TABLET EVERY DAY AS NEEDED -     Discontinue: zolpidem (AMBIEN) 10 MG tablet; Take  1 tablet (10 mg total) by mouth at bedtime as needed. for sleep -     Discontinue: zolpidem (AMBIEN) 10 MG tablet; Take 1 tablet (10 mg total) by mouth at bedtime as needed. for sleep -     Discontinue: zolpidem (AMBIEN) 10 MG tablet; Take 1 tablet (10 mg total) by mouth at bedtime as needed. for sleep -     Discontinue: phentermine (ADIPEX-P) 37.5 MG tablet; Take 1 tablet (37.5 mg total) by mouth daily before breakfast. -     zolpidem (AMBIEN) 10 MG tablet; Take 1 tablet (10 mg total) by mouth at bedtime as needed. for sleep -     phentermine (ADIPEX-P) 37.5 MG tablet; Take 1 tablet (37.5 mg total) by mouth daily before breakfast.    abnormal waking: Improvement with phentermine, refills  provided, urged to exercise most days of the week   insomnia: Controlled continue Ambien.  Please note only one prescription of phentermine and 1 prescription of Ambien were given to the patient, the printer was broken and required multiple submissions for these medications.  Return in about 4 weeks (around 11/21/2014).

## 2014-11-02 DIAGNOSIS — H15129 Nodular episcleritis, unspecified eye: Secondary | ICD-10-CM | POA: Diagnosis not present

## 2014-12-02 ENCOUNTER — Ambulatory Visit (INDEPENDENT_AMBULATORY_CARE_PROVIDER_SITE_OTHER): Payer: Medicare Other | Admitting: Family Medicine

## 2014-12-02 VITALS — BP 122/73 | HR 84 | Wt 259.0 lb

## 2014-12-02 DIAGNOSIS — R635 Abnormal weight gain: Secondary | ICD-10-CM | POA: Diagnosis not present

## 2014-12-02 DIAGNOSIS — I2510 Atherosclerotic heart disease of native coronary artery without angina pectoris: Secondary | ICD-10-CM | POA: Diagnosis not present

## 2014-12-02 MED ORDER — PHENTERMINE HCL 37.5 MG PO TABS: 37.5000 mg | ORAL_TABLET | Freq: Every day | ORAL | Status: DC

## 2014-12-02 NOTE — Progress Notes (Signed)
Successful weight loss, refill provided

## 2014-12-02 NOTE — Progress Notes (Signed)
   Subjective:    Patient ID: Jeremy Woodward, male    DOB: 07/14/1945, 69 y.o.   MRN: [id number]  HPI  Jeremy Woodward is here for a weight check and blood pressure check. Denies any medication problems.   Review of Systems     Objective:   Physical Exam        Assessment & Plan:  Abnormal weight gain - Patient has lost weight. A [facility]ill [facility].

## 2014-12-14 ENCOUNTER — Encounter: Payer: Self-pay | Admitting: Family Medicine

## 2014-12-14 ENCOUNTER — Ambulatory Visit (INDEPENDENT_AMBULATORY_CARE_PROVIDER_SITE_OTHER): Payer: Medicare Other | Admitting: Family Medicine

## 2014-12-14 VITALS — BP 126/73 | HR 77 | Wt 260.0 lb

## 2014-12-14 DIAGNOSIS — E669 Obesity, unspecified: Secondary | ICD-10-CM

## 2014-12-14 DIAGNOSIS — R3911 Hesitancy of micturition: Secondary | ICD-10-CM

## 2014-12-14 DIAGNOSIS — Z0000 Encounter for general adult medical examination without abnormal findings: Secondary | ICD-10-CM | POA: Diagnosis not present

## 2014-12-14 DIAGNOSIS — I10 Essential (primary) hypertension: Secondary | ICD-10-CM | POA: Diagnosis not present

## 2014-12-14 DIAGNOSIS — G4733 Obstructive sleep apnea (adult) (pediatric): Secondary | ICD-10-CM

## 2014-12-14 DIAGNOSIS — Z9989 Dependence on other enabling machines and devices: Secondary | ICD-10-CM

## 2014-12-14 DIAGNOSIS — Z125 Encounter for screening for malignant neoplasm of prostate: Secondary | ICD-10-CM | POA: Diagnosis not present

## 2014-12-14 LAB — COMPLETE METABOLIC PANEL WITH GFR
ALT: 31 U/L (ref 9–46)
AST: 25 U/L (ref 10–35)
Albumin: 4.5 g/dL (ref 3.6–5.1)
Alkaline Phosphatase: 58 U/L (ref 40–115)
BUN: 24 mg/dL (ref 7–25)
CHLORIDE: 103 mmol/L (ref 98–110)
CO2: 19 mmol/L — AB (ref 20–31)
Calcium: 9.7 mg/dL (ref 8.6–10.3)
Creat: 1.19 mg/dL — ABNORMAL HIGH (ref 0.70–1.18)
GFR, EST AFRICAN AMERICAN: 71 mL/min (ref >=60)
GFR, EST NON AFRICAN AMERICAN: 62 mL/min (ref >=60)
Glucose, Bld: 89 mg/dL (ref 65–99)
POTASSIUM: 4.6 mmol/L (ref 3.5–5.3)
SODIUM: 137 mmol/L (ref 135–146)
Total Bilirubin: 0.7 mg/dL (ref 0.2–1.2)
Total Protein: 6.9 g/dL (ref 6.1–8.1)

## 2014-12-14 LAB — CBC
HCT: 43.9 % (ref 39.0–52.0)
HEMOGLOBIN: 15.4 g/dL (ref 13.0–17.0)
MCH: 30.2 pg (ref 26.0–34.0)
MCHC: 35.1 g/dL (ref 30.0–36.0)
MCV: 86.1 fL (ref 78.0–100.0)
MPV: 9.2 fL (ref 8.6–12.4)
Platelets: 305 10*3/uL (ref 150–400)
RBC: 5.1 MIL/uL (ref 4.22–5.81)
RDW: 13 % (ref 11.5–15.5)
WBC: 7.1 10*3/uL (ref 4.0–10.5)

## 2014-12-14 MED ORDER — TAMSULOSIN HCL 0.4 MG PO CAPS: 0.4000 mg | ORAL_CAPSULE | Freq: Every day | ORAL | Status: DC

## 2014-12-14 NOTE — Progress Notes (Signed)
Subjective:    Jeremy Woodward is a 69 y.o. male who presents for Medicare Annual/Subsequent preventive examination.   Preventive Screening-Counseling & Management  Tobacco History  Smoking status  . Never Smoker   Smokeless tobacco  . Never Used    Comment: cigars    Colonoscopy: Due October 2017 Prostate: Discussed screening risks/beneifts with patient today, obtaining PSA Influenza Vaccine: will receive today Pneumovax: UTD Td/Tdap: UTD Zoster: UTD    Problems Prior to Visit 1. Obesity  Current Problems (verified) Patient Active Problem List   Diagnosis Date Noted  . Abnormal weight gain 10/24/2014  . Insomnia 11/15/2013  . DDD (degenerative disc disease), cervical 08/02/2013  . Morbid obesity ([place]) 05/24/2013  . Hearing loss 11/18/2012  . Diarrhea 03/20/2012  . Personal history of colonic polyps 03/20/2012  . Hypogonadism male 02/13/2012  . 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 Obesity 11/14/2011  . Low libido 11/06/2011  . Fatigue 11/06/2011  . Actinic keratosis 11/06/2011  . Nonspecific abnormal finding in stool contents 10/30/2010  . OSA on CPAP 10/23/2010  . CAD, NATIVE VESSEL 12/19/2008  . CHEST PAIN UNSPECIFIED 12/08/2008  . DYSPNEA ON EXERTION 08/31/2008  . Essential hypertension, benign 06/02/2008  . ADHESIVE CAPSULITIS OF SHOULDER 06/02/2008  . Headache(784.0) 02/24/2007  . HYPOGONADISM 07/02/2006  . HYPERLIPIDEMIA 07/02/2006  . EXOGENOUS OBESITY 07/02/2006  . DEPRESSION 07/02/2006  . BENIGN PROSTATIC HYPERTROPHY 07/02/2006    Medications Prior to Visit Current Outpatient Prescriptions on File Prior to Visit  Medication Sig Dispense Refill  . aspirin 81 MG tablet Take 81 mg by mouth daily.      . citalopram (CELEXA) 10 MG tablet TAKE 1 TABLET EVERY DAY AS NEEDED 90 tablet 1  . diphenoxylate-atropine (LOMOTIL) 2.5-0.025 MG per tablet Take 1 tablet by mouth 4 (four) times daily as needed for diarrhea or loose stools. 30 tablet 0  .  losartan (COZAAR) 50 MG tablet TAKE 1 TABLET (50 MG TOTAL) BY MOUTH DAILY. 90 tablet 4  . phentermine (ADIPEX-P) 37.5 MG tablet Take 1 tablet (37.5 mg total) by mouth daily before breakfast. 30 tablet 0  . pravastatin (PRAVACHOL) 40 MG tablet Take 1 tablet (40 mg total) by mouth every evening. 30 tablet 5  . zolpidem (AMBIEN) 10 MG tablet Take 1 tablet (10 mg total) by mouth at bedtime as needed. for sleep 30 tablet 2   No current facility-administered medications on file prior to visit.    Current Medications (verified) Current Outpatient Prescriptions  Medication Sig Dispense Refill  . aspirin 81 MG tablet Take 81 mg by mouth daily.      . citalopram (CELEXA) 10 MG tablet TAKE 1 TABLET EVERY DAY AS NEEDED 90 tablet 1  . diphenoxylate-atropine (LOMOTIL) 2.5-0.025 MG per tablet Take 1 tablet by mouth 4 (four) times daily as needed for diarrhea or loose stools. 30 tablet 0  . losartan (COZAAR) 50 MG tablet TAKE 1 TABLET (50 MG TOTAL) BY MOUTH DAILY. 90 tablet 4  . phentermine (ADIPEX-P) 37.5 MG tablet Take 1 tablet (37.5 mg total) by mouth daily before breakfast. 30 tablet 0  . pravastatin (PRAVACHOL) 40 MG tablet Take 1 tablet (40 mg total) by mouth every evening. 30 tablet 5  . zolpidem (AMBIEN) 10 MG tablet Take 1 tablet (10 mg total) by mouth at bedtime as needed. for sleep 30 tablet 2   No current facility-administered medications for this visit.     Allergies (verified) Review of patient's allergies indicates no known allergies.  PAST HISTORY  Family History Family History  Problem Relation Age of Onset  . Hyperlipidemia Sister   . Hypertension Sister   . Colon cancer Maternal Grandmother     late 16's when dx   . Cancer Father 81    liver and lung  . Heart disease Mother   . Ulcers Mother   . Hypertension Mother   . Aneurysm Mother     Social History Social History  Substance Use Topics  . Smoking status: Never Smoker   . Smokeless tobacco: Never Used      Comment: cigars  . Alcohol Use: No    Are there smokers in your home (other than you)?  No  Risk Factors Current exercise habits: The patient does not participate in regular exercise at present.  Dietary issues discussed: DASH   Cardiac risk factors: advanced age (older than 10 for men, 40 for women), dyslipidemia, hypertension, obesity (BMI >= 30 kg/m2) and sedentary lifestyle.  Depression Screen (Note: if answer to either of the following is "Yes", a more complete depression screening is indicated)   Q1: Over the past two weeks, have you felt down, depressed or hopeless? No  Q2: Over the past two weeks, have you felt little interest or pleasure in doing things? No  Have you lost interest or pleasure in daily life? No  Do you often feel hopeless? No  Do you cry easily over simple problems? No  Activities of Daily Living In your present state of health, do you have any difficulty performing the following activities?:  Driving? No Managing money?  No Feeding yourself? No Getting from bed to chair? No Climbing a flight of stairs? No Preparing food and eating?: No Bathing or showering? No Getting dressed: No Getting to the toilet? No Using the toilet:No Moving around from place to place: No In the past year have you fallen or had a near fall?:No   Are you sexually active?  No  Do you have more than one partner?  No  Hearing Difficulties: Yes Do you often ask people to speak up or repeat themselves? Yes Do you experience ringing or noises in your ears? Yes Do you have difficulty understanding soft or whispered voices? Yes   Do you feel that you have a problem with memory? No  Do you often misplace items? No  Do you feel safe at home?  No  Cognitive Testing  Alert? Yes  Normal Appearance?Yes  Oriented to person? Yes  Place? Yes   Time? Yes  Recall of three objects?  Yes  Can perform simple calculations? Yes  Displays appropriate judgment?Yes  Can read the correct time  from a watch face?Yes   Advanced Directives have been discussed with the patient? Yes   List the Names of Other Physician/Practitioners you currently use: 1.    Indicate any recent Medical Services you may have received from other than [facility] providers in the past year (date may be approximate).  Immunization History  Administered Date(s) Administered  . Influenza Split 10/11/2010, 11/06/2011  . Influenza Whole 12/01/2008, 12/11/2009  . Influenza,inj,Quad PF,36+ Mos 11/19/2013  . Pneumococcal Conjugate-13 11/19/2013  . Pneumococcal Polysaccharide-23 11/14/2011  . Td 12/01/2008  . Tdap 10/11/2010  . Zoster 10/11/2010    Screening Tests Health Maintenance  Topic Date Due  . Hepatitis C Screening  21-Dec-1945  . INFLUENZA VACCINE  08/29/2014  . COLONOSCOPY  11/05/2015  . TETANUS/TDAP  10/10/2020  . ZOSTAVAX  Completed  . PNA vac Low Risk  Adult  Completed    All answers were reviewed with the patient and necessary referrals were made:  Marcial Pacas, DO   12/14/2014   History reviewed: allergies, current medications, past family history, past medical history, past social history, past surgical history and problem list  Review of Systems  Review of Systems - General ROS: negative for - chills, fever, night sweats, weight gain or weight loss Ophthalmic ROS: negative for - decreased vision Psychological ROS: negative for - anxiety or depression ENT ROS: negative for - hearing change,  Allergies. Positive for nasal congestion Hematological and Lymphatic ROS: negative for - bleeding problems, bruising or swollen lymph nodes Breast ROS: negative Respiratory ROS: no cough, shortness of breath, or wheezing Cardiovascular ROS: no chest pain or dyspnea on exertion Gastrointestinal ROS: no abdominal pain, change in bowel habits, or black or bloody stools Genito-Urinary ROS: negative for - genital discharge, genital ulcers, incontinence or abnormal bleeding from  genitals Musculoskeletal ROS: negative for - joint pain or muscle pain Neurological ROS: negative for - headaches or memory loss Dermatological ROS: negative for lumps, mole changes, rash and skin lesion changes   Objective:     Vision by Snellen chart: right eye:20/20, left eye:20/25 Blood pressure 126/73, pulse 77, weight 260 lb (117.935 kg). Body mass index is 33.37 kg/(m^2).  General: No Acute Distress HEENT: Atraumatic, normocephalic, conjunctivae normal without scleral icterus.  No nasal discharge, hearing grossly intact, TMs with good landmarks bilaterally with no middle ear abnormalities, posterior pharynx clear without oral lesions. Neck: Supple, trachea midline, no cervical nor supraclavicular adenopathy. Pulmonary: Clear to auscultation bilaterally without wheezing, rhonchi, nor rales. Cardiac: Regular rate and rhythm.  No murmurs, rubs, nor gallops. No peripheral edema.  2+ peripheral pulses bilaterally. Abdomen: Bowel sounds normal.  No masses.  Non-tender without rebound.  Negative Murphy's sign. GU:no inguinal hernia MSK: Grossly intact, no signs of weakness.  Full strength throughout upper and lower extremities.  Full ROM in upper and lower extremities.  No midline spinal tenderness. Neuro: Gait unremarkable, CN II-XII grossly intact.  C5-C6 Reflex 2/4 Bilaterally, L4 Reflex 2/4 Bilaterally.  Cerebellar function intact. Skin: No rashes. Psych: Alert and oriented to person/place/time.  Thought process normal. No anxiety/depression.     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      Plan:     During the course of the visit the patient was educated and counseled about appropriate screening and preventive services including:    Influenza vaccine  Diet review for nutrition referral? Yes    Patient Instructions (the written plan) was given to the patient.  Medicare Attestation I have  personally reviewed: The patient's medical and social history Their use of alcohol, tobacco or illicit drugs Their current medications and supplements The patient's functional ability including ADLs,fall risks, home safety risks, cognitive, and hearing and visual impairment Diet and physical activities Evidence for depression or mood disorders  The patient's weight, height, BMI, and visual acuity have been recorded in the chart.  I have made referrals, counseling, and provided education to the patient based on review of the above and I have provided the patient with a written personalized care plan for preventive services.     Marcial Pacas, DO   12/14/2014

## 2014-12-15 LAB — PSA: PSA: 1.39 ng/mL (ref <=4.00)

## 2014-12-17 ENCOUNTER — Other Ambulatory Visit: Payer: Self-pay | Admitting: Cardiology

## 2015-01-05 ENCOUNTER — Ambulatory Visit (INDEPENDENT_AMBULATORY_CARE_PROVIDER_SITE_OTHER): Payer: Medicare Other | Admitting: Family Medicine

## 2015-01-05 ENCOUNTER — Encounter: Payer: Self-pay | Admitting: Family Medicine

## 2015-01-05 VITALS — BP 124/79 | HR 84 | Temp 98.2°F | Wt 256.0 lb

## 2015-01-05 DIAGNOSIS — R05 Cough: Secondary | ICD-10-CM

## 2015-01-05 DIAGNOSIS — R635 Abnormal weight gain: Secondary | ICD-10-CM | POA: Diagnosis not present

## 2015-01-05 DIAGNOSIS — I2510 Atherosclerotic heart disease of native coronary artery without angina pectoris: Secondary | ICD-10-CM

## 2015-01-05 DIAGNOSIS — R059 Cough, unspecified: Secondary | ICD-10-CM

## 2015-01-05 MED ORDER — HYDROCODONE-HOMATROPINE 5-1.5 MG/5ML PO SYRP
5.0000 mL | ORAL_SOLUTION | Freq: Four times a day (QID) | ORAL | Status: DC | PRN
Start: 2015-01-05 — End: 2015-02-22

## 2015-01-05 MED ORDER — PHENTERMINE HCL 37.5 MG PO TABS
37.5000 mg | ORAL_TABLET | Freq: Every day | ORAL | Status: DC
Start: 2015-01-05 — End: 2015-02-22

## 2015-01-05 MED ORDER — AZITHROMYCIN 250 MG PO TABS: ORAL_TABLET | ORAL | Status: AC

## 2015-01-05 NOTE — Progress Notes (Signed)
CC: Jeremy Woodward is a 69 y.o. male is here for Sinusitis   Subjective: HPI:  Cough which is productive and has been present for the past week off and on. Symptoms are moderate in severity all hours of the day and interfere with sleep. No benefit from Coricidin. No other interventions as of yet. Jeremy Woodward feels weak and somewhat out of breath when active. Symptoms are worsened if Jeremy Woodward does yard work. Jeremy Woodward's had a loss of his voice that occurs in the evenings. Jeremy Woodward denies fevers or chills. Jeremy Woodward has had some mild right-sided lateral chest pain with coughing. No difficulty with breathing or pain with breathing. Denies sore throat, itchy eyes, ear complaints, nor exertional chest pain.  Jeremy Woodward's been successful at losing around 3 pounds since Jeremy Woodward was seen last per his weight check. Jeremy Woodward's noticed that Jeremy Woodward is losing weight with respect to clothing. Jeremy Woodward believes it phentermine is open with portion control without any known side effects.   Review Of Systems Outlined In HPI  Past Medical History  Diagnosis Date  . Depression   . Hyperlipidemia   . Obesity   . BPH (benign prostatic hypertrophy)   . Hypertension   . CAD (coronary artery disease)   . OSA on CPAP   . Arthritis   . Diarrhea   . CHF (congestive heart failure) [facility])     Past Surgical History  Procedure Laterality Date  . Tonsillectomy    . 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 Left and right heart catheterization with coronary angiogram N/A 03/05/2011    Procedure: LEFT AND RIGHT HEART CATHETERIZATION WITH CORONARY ANGIOGRAM;  Surgeon: Larey Dresser, MD;  Location: [facility] CATH LAB;  Service: Cardiovascular;  Laterality: N/A;   Family History  Problem Relation Age of Onset  . Hyperlipidemia Sister   . Hypertension Sister   . Colon cancer Maternal Grandmother     late 38's when dx   . Cancer Father 83    liver and lung  . Heart disease Mother   . Ulcers Mother   . Hypertension Mother   . Aneurysm Mother      Social History   Social History  . Marital Status: Married    Spouse Name: N/A  . Number of Children: 1  . Years of Education: N/A   Occupational History  . Retired    Social History Main Topics  . Smoking status: Never Smoker   . Smokeless tobacco: Never Used     Comment: cigars  . Alcohol Use: No  . Drug Use: No  . Sexual Activity: Not on file   Other Topics Concern  . Not on file   Social History Narrative     Objective: BP 124/79 mmHg  Pulse 84  Temp(Src) 98.2 F (36.8 C) (Oral)  Wt 256 lb (116.121 kg)  SpO2 97%  General: Alert and Oriented, No Acute Distress HEENT: Pupils equal, round, reactive to light. Conjunctivae clear.  External ears unremarkable, canals clear with intact TMs with appropriate landmarks.  Middle ear appears open without effusion. Pink inferior turbinates.  Moist mucous membranes, pharynx without inflammation nor lesions.  Neck supple without palpable lymphadenopathy nor abnormal masses. Lungs: Comfortable work of breathing with mild rales in the right posterior inferior lung field, no rhonchi or wheezing. Cardiac: Regular rate and rhythm. Normal S1/S2.  No murmurs, rubs, nor gallops.   Extremities: No peripheral edema.  Strong peripheral pulses.  Mental Status: No depression,  anxiety, nor agitation. Skin: Warm and dry.  Assessment & Plan: Jeremy Woodward was seen today for sinusitis.  Diagnoses and all orders for this visit:  Abnormal weight gain -     phentermine (ADIPEX-P) 37.5 MG tablet; Take 1 tablet (37.5 mg total) by mouth daily before breakfast.  Cough -     HYDROcodone-homatropine (HYCODAN) 5-1.5 MG/5ML syrup; Take 5 mLs by mouth every 6 (six) hours as needed for cough. -     azithromycin (ZITHROMAX) 250 MG tablet; Take two tabs at once on day 1, then one tab daily on days 2-5.    Abnormal weight gain and obesity: Improved with phentermine, refills provided Cough: Start Hycodan as needed especially for sleep. Begin azithromycin. Call  if no better on Monday.  Return in about 4 weeks (around 02/02/2015).

## 2015-01-13 ENCOUNTER — Other Ambulatory Visit: Payer: Self-pay | Admitting: Family Medicine

## 2015-01-15 ENCOUNTER — Encounter: Payer: Self-pay | Admitting: Family Medicine

## 2015-01-16 MED ORDER — BENZONATATE 200 MG PO CAPS: 200.0000 mg | ORAL_CAPSULE | Freq: Three times a day (TID) | ORAL | Status: DC | PRN

## 2015-02-22 ENCOUNTER — Ambulatory Visit (INDEPENDENT_AMBULATORY_CARE_PROVIDER_SITE_OTHER): Payer: Medicare Other | Admitting: Family Medicine

## 2015-02-22 VITALS — BP 133/62 | HR 72 | Ht 75.0 in | Wt 254.0 lb

## 2015-02-22 DIAGNOSIS — R635 Abnormal weight gain: Secondary | ICD-10-CM | POA: Diagnosis not present

## 2015-02-22 MED ORDER — PHENTERMINE HCL 37.5 MG PO TABS: 37.5000 mg | ORAL_TABLET | Freq: Every day | ORAL | Status: DC

## 2015-02-22 NOTE — Progress Notes (Signed)
Patient was in for weight check today. Patient had no complaints and request that [facility]. Oneta Rack

## 2015-02-22 NOTE — Progress Notes (Signed)
Weight loss success, refill provided 

## 2015-03-23 ENCOUNTER — Other Ambulatory Visit: Payer: Self-pay

## 2015-03-23 MED ORDER — PRAVASTATIN SODIUM 40 MG PO TABS: 40.0000 mg | ORAL_TABLET | Freq: Every day | ORAL | Status: DC

## 2015-03-23 NOTE — Telephone Encounter (Signed)
Rx(s) sent to pharmacy electronically.  

## 2015-04-07 ENCOUNTER — Encounter: Payer: Self-pay | Admitting: *Deleted

## 2015-05-01 ENCOUNTER — Telehealth: Payer: Self-pay | Admitting: Family Medicine

## 2015-05-01 NOTE — Telephone Encounter (Signed)
Patient called into the office today about his bill from [facility]).  They are denying his PSA lab test.  I called [facility] at [phone] and spoke with [facility], I added diagnosis code Z12.5 (screening for PSA).  I called and spoke with Denyse Amass and told him to allow 4 weeks to hear back from [place].

## 2015-06-06 ENCOUNTER — Other Ambulatory Visit: Payer: Self-pay | Admitting: Cardiology

## 2015-06-06 NOTE — Telephone Encounter (Signed)
REFILL 

## 2015-06-20 NOTE — Progress Notes (Signed)
HPI: FU coronary artery disease. History of empyema and VATS/decortication, HTN, and long-standing exertional shortness of breath. Abd ultrasound 2010 with no aneurysm. CTA 2010 showed no pulmonary embolus. Left and right heart catheterization in 11/10 showed normal left and right heart filling pressures and no pulmonary hypertension; 50% distal left main stenosis and 50% proximal LAD stenosis. IVUS and FFR were done of both lesions, and it appeared that both were not hemodynamically significant and not likely to be the cause of his symptoms. He had a cardiopulmonary stress test in 12/10 to try to elucidate the cause of his shortness of breath. This was actually suggestive of possible ischemia. Therefore, he did an ETT-myoview in 12/10 as well. He became quite short of breath but exercised 8 minutes with no ECG changes and no evidence for ischemia or infarction. It was decided to continue medical management. He had an ETT-myoview a year later in 12/11, showing no ischemia or infarction. Patient reported somewhat increased exertional dyspnea as well as occasional chest pain episodes, so ETT was done in 1/13, showing 6:20 exercise with discontinuation due to profound dyspnea and some chest pain. Given very severe symptoms with ETT and progressive symptoms, LHC and RHC repeated and showed normal right and left heart filling pressure. LHC showed stable to improved coronary disease, with 40% distal LM and 40-50% proximal LAD. Since last seen, the patient has dyspnea with more extreme activities but not with routine activities. It is relieved with rest. It is not associated with chest pain. There is no orthopnea, PND or pedal edema. There is no syncope or palpitations. There is no exertional chest pain.   Current Outpatient Prescriptions  Medication Sig Dispense Refill  . aspirin 81 MG tablet Take 81 mg by mouth daily.      . Cholecalciferol (VITAMIN D3) 2000 units TABS Take 2,000 Units by mouth daily.      . citalopram (CELEXA) 10 MG tablet TAKE 1 TABLET EVERY DAY AS NEEDED 90 tablet 1  . Ibuprofen (ADVIL PO) Take by mouth as needed (pain).    Marland Kitchen losartan (COZAAR) 50 MG tablet TAKE 1 TABLET (50 MG TOTAL) BY MOUTH DAILY. 90 tablet 4  . Omega-3 Fatty Acids (FISH OIL) 1000 MG CAPS Take 1,000 mg by mouth 3 (three) times daily.    . phentermine (ADIPEX-P) 37.5 MG tablet Take 1 tablet (37.5 mg total) by mouth daily before breakfast. 30 tablet 0  . pravastatin (PRAVACHOL) 40 MG tablet Take 1 tablet (40 mg total) by mouth daily. KEEP OV. 90 tablet 0  . Saw Palmetto, Serenoa repens, (SAW PALMETTO PO) Take 450 mg by mouth 2 (two) times daily.    Marland Kitchen zolpidem (AMBIEN) 10 MG tablet Take 1 tablet (10 mg total) by mouth at bedtime as needed. for sleep 30 tablet 2   No current facility-administered medications for this visit.     Past Medical History  Diagnosis Date  . Depression   . Hyperlipidemia   . Obesity   . BPH (benign prostatic hypertrophy)   . Hypertension   . CAD (coronary artery disease)   . OSA on CPAP   . Arthritis   . Diarrhea   . CHF (congestive heart failure) [facility])     Past Surgical History  Procedure Laterality Date  . Tonsillectomy    . 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 Left and right heart catheterization with coronary angiogram N/A 03/05/2011  Procedure: LEFT AND RIGHT HEART CATHETERIZATION WITH CORONARY ANGIOGRAM;  Surgeon: Larey Dresser, MD;  Location: [facility] CATH LAB;  Service: Cardiovascular;  Laterality: N/A;    Social History   Social History  . Marital Status: Married    Spouse Name: N/A  . Number of Children: 1  . Years of Education: N/A   Occupational History  . Retired    Social History Main Topics  . Smoking status: Never Smoker   . Smokeless tobacco: Never Used     Comment: cigars  . Alcohol Use: No  . Drug Use: No  . Sexual Activity: Not on file   Other Topics Concern  . Not on file   Social History  Narrative    Family History  Problem Relation Age of Onset  . Hyperlipidemia Sister   . Hypertension Sister   . Colon cancer Maternal Grandmother     late 80's when dx   . Cancer Father 25    liver and lung  . Heart disease Mother   . Ulcers Mother   . Hypertension Mother   . Aneurysm Mother     ROS: no fevers or chills, productive cough, hemoptysis, dysphasia, odynophagia, melena, hematochezia, dysuria, hematuria, rash, seizure activity, orthopnea, PND, pedal edema, claudication. Remaining systems are negative.  Physical Exam: Well-developed obese in no acute distress.  Skin is warm and dry.  HEENT is normal.  Neck is supple.  Chest is clear to auscultation with normal expansion.  Cardiovascular exam is regular rate and rhythm.  Abdominal exam nontender or distended. No masses palpated. Extremities show no edema. neuro grossly intact  ECG Sinus rhythm at a rate of 64. RV conduction delay.

## 2015-06-21 ENCOUNTER — Encounter: Payer: Self-pay | Admitting: Cardiology

## 2015-06-21 ENCOUNTER — Ambulatory Visit (INDEPENDENT_AMBULATORY_CARE_PROVIDER_SITE_OTHER): Payer: Medicare Other | Admitting: Cardiology

## 2015-06-21 VITALS — BP 132/78 | HR 65 | Ht 75.0 in | Wt 250.0 lb

## 2015-06-21 DIAGNOSIS — E669 Obesity, unspecified: Secondary | ICD-10-CM | POA: Diagnosis not present

## 2015-06-21 DIAGNOSIS — I2510 Atherosclerotic heart disease of native coronary artery without angina pectoris: Secondary | ICD-10-CM | POA: Diagnosis not present

## 2015-06-21 NOTE — Assessment & Plan Note (Signed)
Continue aspirin and statin. 

## 2015-06-21 NOTE — Patient Instructions (Signed)
Medication Instructions:   NO CHANGE  Labwork:  Your physician recommends that you HAVE LAB WORK TODAY  Follow-Up:  Your physician wants you to follow-up in: ONE YEAR WITH DR CRENSHAW You will receive a reminder letter in the mail two months in advance. If you don't receive a letter, please call our office to schedule the follow-up appointment.   If you need a refill on your cardiac medications before your next appointment, please call your pharmacy.    

## 2015-06-21 NOTE — Assessment & Plan Note (Signed)
Blood pressure control. Continue present medications. Check potassium and renal function. 

## 2015-06-21 NOTE — Assessment & Plan Note (Signed)
Continue statin. Check lipids and liver. 

## 2015-06-21 NOTE — Assessment & Plan Note (Signed)
Patient has lost weight recently with Medication. Will continue his diet. Much improved.

## 2015-06-21 NOTE — Assessment & Plan Note (Deleted)
Patient has lost weight recently with Medication. Will continue his diet. Much improved.

## 2015-06-22 LAB — BASIC METABOLIC PANEL
BUN: 20 mg/dL (ref 7–25)
CALCIUM: 9.8 mg/dL (ref 8.6–10.3)
CO2: 21 mmol/L (ref 20–31)
CREATININE: 1.24 mg/dL — AB (ref 0.70–1.18)
Chloride: 99 mmol/L (ref 98–110)
Glucose, Bld: 81 mg/dL (ref 65–99)
POTASSIUM: 4.3 mmol/L (ref 3.5–5.3)
SODIUM: 137 mmol/L (ref 135–146)

## 2015-06-22 LAB — HEPATIC FUNCTION PANEL
ALBUMIN: 4.5 g/dL (ref 3.6–5.1)
ALT: 27 U/L (ref 9–46)
AST: 24 U/L (ref 10–35)
Alkaline Phosphatase: 50 U/L (ref 40–115)
BILIRUBIN TOTAL: 0.7 mg/dL (ref 0.2–1.2)
Bilirubin, Direct: 0.1 mg/dL (ref <=0.2)
Indirect Bilirubin: 0.6 mg/dL (ref 0.2–1.2)
Total Protein: 6.9 g/dL (ref 6.1–8.1)

## 2015-06-22 LAB — LIPID PANEL
CHOL/HDL RATIO: 4.1 ratio (ref <=5.0)
CHOLESTEROL: 189 mg/dL (ref 125–200)
HDL: 46 mg/dL (ref >=40)
LDL Cholesterol: 107 mg/dL (ref <130)
Triglycerides: 181 mg/dL — ABNORMAL HIGH (ref <150)
VLDL: 36 mg/dL — AB (ref <30)

## 2015-07-05 ENCOUNTER — Telehealth: Payer: Self-pay | Admitting: *Deleted

## 2015-07-05 DIAGNOSIS — E785 Hyperlipidemia, unspecified: Secondary | ICD-10-CM

## 2015-07-05 MED ORDER — ATORVASTATIN CALCIUM 80 MG PO TABS: 80.0000 mg | ORAL_TABLET | Freq: Every day | ORAL | Status: DC

## 2015-07-05 NOTE — Telephone Encounter (Signed)
-----   Message from Lelon Perla, MD sent at 06/22/2015  5:07 AM EDT ----- Dc pravachol, lipitor 80 mg daily, lipids and liver 4 weeks [facility]

## 2015-07-05 NOTE — Telephone Encounter (Signed)
Spoke with pt, aware of lab results, he agrees to change to the lipitor. New script sent to the pharmacy  Lab orders mailed to the pt

## 2015-08-02 ENCOUNTER — Other Ambulatory Visit: Payer: Self-pay | Admitting: Cardiology

## 2015-08-03 ENCOUNTER — Telehealth: Payer: Self-pay | Admitting: Family Medicine

## 2015-08-03 NOTE — Telephone Encounter (Signed)
Terril came into the office today and spoke with me about him continuing to get a denial from [facility] for his PSA test on 12/14/14.  I called [facility] on 05/01/15 and gave an additional diagnosis.  I called [facility] again today at [phone] and spoke with Santiago Glad.  After doing research Santiago Glad stated that the issue was on their end, she added the CPT code that was missing and resubmitted the claim.  Santiago Glad stated we should allow 30 to 45 days for reprocessing.  I explained the issue to [place] and told him to allow 30 to 45 days for reprocessing and if he receives another bill to call me and I will call [facility] again.

## 2015-08-07 ENCOUNTER — Other Ambulatory Visit: Payer: Self-pay | Admitting: *Deleted

## 2015-08-07 ENCOUNTER — Encounter: Payer: Self-pay | Admitting: Cardiology

## 2015-08-07 DIAGNOSIS — E785 Hyperlipidemia, unspecified: Secondary | ICD-10-CM | POA: Diagnosis not present

## 2015-08-07 DIAGNOSIS — I2510 Atherosclerotic heart disease of native coronary artery without angina pectoris: Secondary | ICD-10-CM

## 2015-08-07 LAB — HEPATIC FUNCTION PANEL
ALBUMIN: 4.3 g/dL (ref 3.6–5.1)
ALT: 25 U/L (ref 9–46)
AST: 24 U/L (ref 10–35)
Alkaline Phosphatase: 51 U/L (ref 40–115)
BILIRUBIN TOTAL: 0.6 mg/dL (ref 0.2–1.2)
Bilirubin, Direct: 0.1 mg/dL (ref <=0.2)
Indirect Bilirubin: 0.5 mg/dL (ref 0.2–1.2)
Total Protein: 6.3 g/dL (ref 6.1–8.1)

## 2015-08-07 LAB — LIPID PANEL
CHOL/HDL RATIO: 2.5 ratio (ref <=5.0)
Cholesterol: 113 mg/dL — ABNORMAL LOW (ref 125–200)
HDL: 46 mg/dL (ref >=40)
LDL CALC: 49 mg/dL (ref <130)
Triglycerides: 91 mg/dL (ref <150)
VLDL: 18 mg/dL (ref <30)

## 2015-08-07 NOTE — Telephone Encounter (Signed)
This encounter was created in error - please disregard.

## 2015-08-18 ENCOUNTER — Encounter: Payer: Self-pay | Admitting: Family Medicine

## 2015-08-21 ENCOUNTER — Other Ambulatory Visit: Payer: Self-pay | Admitting: Family Medicine

## 2015-08-21 DIAGNOSIS — R635 Abnormal weight gain: Secondary | ICD-10-CM

## 2015-08-24 ENCOUNTER — Ambulatory Visit (INDEPENDENT_AMBULATORY_CARE_PROVIDER_SITE_OTHER): Payer: Medicare Other | Admitting: Family Medicine

## 2015-08-24 VITALS — BP 129/57 | HR 75 | Ht 75.0 in | Wt 264.0 lb

## 2015-08-24 DIAGNOSIS — I2510 Atherosclerotic heart disease of native coronary artery without angina pectoris: Secondary | ICD-10-CM | POA: Diagnosis not present

## 2015-08-24 DIAGNOSIS — R635 Abnormal weight gain: Secondary | ICD-10-CM

## 2015-08-24 DIAGNOSIS — Z1159 Encounter for screening for other viral diseases: Secondary | ICD-10-CM

## 2015-08-24 MED ORDER — PHENTERMINE HCL 37.5 MG PO TABS
37.5000 mg | ORAL_TABLET | Freq: Every day | ORAL | 0 refills | Status: DC
Start: 2015-08-24 — End: 2015-09-28

## 2015-08-24 NOTE — Progress Notes (Signed)
Pt in today for weight/bp check to restart phentermine.  Hep C screening was also ordered fo him.  Gionna Polak, CMA   Low pressure is at goal and BMI is 33. Okay to restart phentermine. Beatrice Lecher, MD

## 2015-08-25 LAB — HEPATITIS C ANTIBODY: HCV AB: NEGATIVE

## 2015-09-04 ENCOUNTER — Encounter: Payer: Self-pay | Admitting: Gastroenterology

## 2015-09-28 ENCOUNTER — Ambulatory Visit (INDEPENDENT_AMBULATORY_CARE_PROVIDER_SITE_OTHER): Payer: Medicare Other | Admitting: Sports Medicine

## 2015-09-28 VITALS — BP 133/57 | HR 73 | Temp 98.1°F | Wt 255.0 lb

## 2015-09-28 DIAGNOSIS — R635 Abnormal weight gain: Secondary | ICD-10-CM | POA: Diagnosis not present

## 2015-09-28 DIAGNOSIS — Z23 Encounter for immunization: Secondary | ICD-10-CM | POA: Diagnosis not present

## 2015-09-28 MED ORDER — PHENTERMINE HCL 37.5 MG PO TABS: 37.5000 mg | ORAL_TABLET | Freq: Every day | ORAL | 0 refills | Status: DC

## 2015-09-28 NOTE — Progress Notes (Signed)
   Subjective:    Patient ID: Jeremy Woodward, male    DOB: [birth year], 70 y.o.   MRN: [id number]  HPI  Jeremy Woodward presents to clinic for weight, BP check.  Denies SOB, heart palpitations, and excessive thirst.   Pt also got a flu shot today. Denies allergy to eggs, latex, and past allergic reaction to flu vaccine.   Review of Systems     Objective:   Physical Exam        Assessment & Plan:  Pt lost 11 lbs.  He was advise to make next appointment in 30 days. -EMH/RMA

## 2015-09-28 NOTE — Patient Instructions (Signed)
Pt advise to make next appointment in 30 days.

## 2015-12-12 ENCOUNTER — Encounter: Payer: Self-pay | Admitting: Gastroenterology

## 2015-12-27 DIAGNOSIS — G4700 Insomnia, unspecified: Secondary | ICD-10-CM | POA: Diagnosis not present

## 2015-12-27 DIAGNOSIS — E669 Obesity, unspecified: Secondary | ICD-10-CM | POA: Diagnosis not present

## 2015-12-27 DIAGNOSIS — G4733 Obstructive sleep apnea (adult) (pediatric): Secondary | ICD-10-CM | POA: Diagnosis not present

## 2016-01-15 ENCOUNTER — Ambulatory Visit (INDEPENDENT_AMBULATORY_CARE_PROVIDER_SITE_OTHER): Payer: Medicare Other

## 2016-01-15 ENCOUNTER — Ambulatory Visit (INDEPENDENT_AMBULATORY_CARE_PROVIDER_SITE_OTHER): Payer: Medicare Other | Admitting: Family Medicine

## 2016-01-15 ENCOUNTER — Encounter: Payer: Self-pay | Admitting: Family Medicine

## 2016-01-15 VITALS — BP 128/57 | HR 74 | Wt 267.0 lb

## 2016-01-15 DIAGNOSIS — R059 Cough, unspecified: Secondary | ICD-10-CM

## 2016-01-15 DIAGNOSIS — R05 Cough: Secondary | ICD-10-CM

## 2016-01-15 DIAGNOSIS — R635 Abnormal weight gain: Secondary | ICD-10-CM

## 2016-01-15 DIAGNOSIS — I10 Essential (primary) hypertension: Secondary | ICD-10-CM | POA: Diagnosis not present

## 2016-01-15 DIAGNOSIS — Z125 Encounter for screening for malignant neoplasm of prostate: Secondary | ICD-10-CM

## 2016-01-15 DIAGNOSIS — Z0000 Encounter for general adult medical examination without abnormal findings: Secondary | ICD-10-CM

## 2016-01-15 LAB — COMPLETE METABOLIC PANEL WITH GFR
ALT: 31 U/L (ref 9–46)
AST: 26 U/L (ref 10–35)
Albumin: 4.4 g/dL (ref 3.6–5.1)
Alkaline Phosphatase: 51 U/L (ref 40–115)
BUN: 19 mg/dL (ref 7–25)
CHLORIDE: 105 mmol/L (ref 98–110)
CO2: 25 mmol/L (ref 20–31)
Calcium: 9.8 mg/dL (ref 8.6–10.3)
Creat: 1.19 mg/dL — ABNORMAL HIGH (ref 0.70–1.18)
GFR, EST NON AFRICAN AMERICAN: 61 mL/min (ref >=60)
GFR, Est African American: 71 mL/min (ref >=60)
GLUCOSE: 98 mg/dL (ref 65–99)
POTASSIUM: 4.5 mmol/L (ref 3.5–5.3)
SODIUM: 138 mmol/L (ref 135–146)
Total Bilirubin: 0.8 mg/dL (ref 0.2–1.2)
Total Protein: 6.7 g/dL (ref 6.1–8.1)

## 2016-01-15 LAB — PSA: PSA: 1.1 ng/mL (ref <=4.0)

## 2016-01-15 MED ORDER — PHENTERMINE HCL 37.5 MG PO TABS: 37.5000 mg | ORAL_TABLET | Freq: Every day | ORAL | 0 refills | Status: DC

## 2016-01-15 MED ORDER — DIPHENOXYLATE-ATROPINE 2.5-0.025 MG/5ML PO LIQD: 5.0000 mL | Freq: Four times a day (QID) | ORAL | 0 refills | Status: DC | PRN

## 2016-01-15 NOTE — Patient Instructions (Signed)

## 2016-01-15 NOTE — Progress Notes (Addendum)
Subjective:   Jeremy Woodward is a 70 y.o. male who presents for Medicare Annual/Subsequent preventive examination.He was previously seen by one of his partners but I have been seeing his wife for years.  Obesity/abnormal weight gain-he would like to get back on phentermine. He was taking it with Dr. Barbaraann Barthel earlier this year. He said he did really well on it without any chest pressures of breath elevation in blood pressure. He understands that he will need to come in every 30 days to monitor carefully. Also reminded him that this is not long-term treatment and has never been studied long-term. It is a stimulant and this has increased risks for cardiovascular side effects.  Review of Systems:  Comprehensive of review systems is negative.       Objective:    Vitals: BP (!) 128/57   Pulse 74   Wt 267 lb (121.1 kg)   BMI 33.37 kg/m   Body mass index is 33.37 kg/m.   Physical Exam  Constitutional: He is oriented to person, place, and time. He appears well-developed and well-nourished.  HENT:  Head: Normocephalic and atraumatic.  Right Ear: External ear normal.  Left Ear: External ear normal.  Nose: Nose normal.  Mouth/Throat: Oropharynx is clear and moist.  Eyes: Conjunctivae and EOM are normal. Pupils are equal, round, and reactive to light.  Neck: Normal range of motion. Neck supple. No thyromegaly present.  Cardiovascular: Normal rate, regular rhythm, normal heart sounds and intact distal pulses.   Pulmonary/Chest: Effort normal. No respiratory distress. He has no wheezes. He has no rales. He exhibits no tenderness.  Expiratory "squeak" in the RLL of the lung.  Not true wheezing.    Abdominal: Soft. Bowel sounds are normal. He exhibits no distension and no mass. There is no tenderness. There is no rebound and no guarding.  Musculoskeletal: Normal range of motion.  Lymphadenopathy:    He has no cervical adenopathy.  Neurological: He is alert and oriented to person, place, and  time. He has normal reflexes.  Skin: Skin is warm and dry.  Psychiatric: He has a normal mood and affect. His behavior is normal. Judgment and thought content normal.     Tobacco History  Smoking Status  . Never Smoker  Smokeless Tobacco  . Never Used    Comment: cigars     Counseling given: Not Answered   Past Medical History:  Diagnosis Date  . Arthritis   . BPH (benign prostatic hypertrophy)   . CAD (coronary artery disease)   . CHF (congestive heart failure) ([place])   . Depression   . Diarrhea   . Hyperlipidemia   . Hypertension   . Obesity   . OSA on CPAP    Past Surgical History:  Procedure Laterality Date  . LEFT AND RIGHT HEART CATHETERIZATION WITH CORONARY ANGIOGRAM N/A 03/05/2011   Procedure: LEFT AND RIGHT HEART CATHETERIZATION WITH CORONARY ANGIOGRAM;  Surgeon: Larey Dresser, MD;  Location: [facility] CATH LAB;  Service: Cardiovascular;  Laterality: N/A;  . [place]   right with drainage utilizing VAT for empyema  . TONSILLECTOMY     Family History  Problem Relation Age of Onset  . Hyperlipidemia Sister   . Hypertension Sister   . Colon cancer Maternal Grandmother     late 51's when dx   . Cancer Father 13    liver and lung  . Heart disease Mother   . Ulcers Mother   . Hypertension Mother   . Aneurysm  Mother    History  Sexual Activity  . Sexual activity: Not on file    Outpatient Encounter Prescriptions as of 01/15/2016  Medication Sig  . aspirin 81 MG tablet Take 81 mg by mouth daily.    Marland Kitchen atorvastatin (LIPITOR) 80 MG tablet Take 1 tablet (80 mg total) by mouth daily.  . Cholecalciferol (VITAMIN D3) 2000 units TABS Take 2,000 Units by mouth daily.  . citalopram (CELEXA) 10 MG tablet TAKE 1 TABLET EVERY DAY AS NEEDED  . Ibuprofen (ADVIL PO) Take by mouth as needed (pain).  Marland Kitchen losartan (COZAAR) 50 MG tablet TAKE 1 TABLET BY MOUTH EVERY DAY  . Omega-3 Fatty Acids (FISH OIL) 1000 MG CAPS Take 1,000 mg by mouth 3 (three) times daily.  .  phentermine (ADIPEX-P) 37.5 MG tablet Take 1 tablet (37.5 mg total) by mouth daily before breakfast.  . Saw Palmetto, Serenoa repens, (SAW PALMETTO PO) Take 450 mg by mouth 2 (two) times daily.  Marland Kitchen zolpidem (AMBIEN) 10 MG tablet Take 1 tablet (10 mg total) by mouth at bedtime as needed. for sleep  . [DISCONTINUED] phentermine (ADIPEX-P) 37.5 MG tablet Take 1 tablet (37.5 mg total) by mouth daily before breakfast.  . diphenoxylate-atropine (LOMOTIL) 2.5-0.025 MG/5ML liquid Take 5 mLs by mouth 4 (four) times daily as needed for diarrhea or loose stools.   No facility-administered encounter medications on file as of 01/15/2016.     Activities of Daily Living In your present state of health, do you have any difficulty performing the following activities: 01/15/2016  Hearing? Y  Vision? N  Difficulty concentrating or making decisions? N  Walking or climbing stairs? N  Dressing or bathing? N  Doing errands, shopping? N  Some recent data might be hidden    Patient Care Team: Marcial Pacas, DO as PCP - General (Family Medicine) Lelon Perla, MD as Consulting Physician (Cardiology) Amil Amen, MD as Referring Physician (Psychiatry)   Assessment:    Medicare Wellness Exam   Exercise Activities and Dietary recommendations Current Exercise Habits: Home exercise routine, Type of exercise: walking  Goals    None     Fall Risk Fall Risk  01/15/2016  Falls in the past year? No   Depression Screen PHQ 2/9 Scores 01/15/2016  PHQ - 2 Score 0    Cognitive Function     6CIT Screen 01/15/2016  What Year? 0 points  What month? 0 points  What time? 0 points  Count back from 20 0 points  Months in reverse 0 points  Repeat phrase 0 points  Total Score 0    Immunization History  Administered Date(s) Administered  . Influenza Split 10/11/2010, 11/06/2011  . Influenza Whole 12/01/2008, 12/11/2009, 09/30/2014  . Influenza,inj,Quad PF,36+ Mos 11/19/2013, 09/28/2015  .  Influenza-Unspecified 09/29/2014  . Pneumococcal Conjugate-13 11/19/2013  . Pneumococcal Polysaccharide-23 11/14/2011  . Td 12/01/2008  . Tdap 10/11/2010  . Zoster 10/11/2010   Screening Tests Health Maintenance  Topic Date Due  . COLONOSCOPY  11/05/2015  . TETANUS/TDAP  10/10/2020  . INFLUENZA VACCINE  Completed  . ZOSTAVAX  Completed  . Hepatitis C Screening  Completed  . PNA vac Low Risk Adult  Completed      Plan:    During the course of the visit the patient was educated and counseled about the following appropriate screening and preventive services:   Vaccines to include Pneumoccal, Influenza, Hepatitis B, Td, Zostavax, HCV  Cardiovascular Disease - sees cardiology yearly.    Colorectal cancer screening -  scheduled in January.   Diabetes screening  Prostate Cancer Screening - due for psa  Glaucoma screening  Nutrition counseling   He did have a small squeak in the right lower lung. He said years ago he had a lot of fluid in the long ends up having a drain and then had some permanent scar tissue afterwards. He has had a cough with upper a story symptoms over the last week as well.  Obesity/BMI 33-we will restart phentermine. Follow up in 30 days for blood pressure and weight check. Her gym to get back on track with exercise. He admits he has fallen off the last several weeks of his routine  Patient Instructions (the written plan) was given to the patient.    METHENEY,CATHERINE, MD  01/15/2016

## 2016-02-01 ENCOUNTER — Ambulatory Visit (AMBULATORY_SURGERY_CENTER): Payer: Self-pay

## 2016-02-01 VITALS — Ht 74.0 in | Wt 260.0 lb

## 2016-02-01 DIAGNOSIS — Z86010 Personal history of colonic polyps: Secondary | ICD-10-CM

## 2016-02-01 DIAGNOSIS — Z800 Family history of malignant neoplasm of digestive organs: Secondary | ICD-10-CM

## 2016-02-01 MED ORDER — NA SULFATE-K SULFATE-MG SULF 17.5-3.13-1.6 GM/177ML PO SOLN: ORAL | 0 refills | Status: DC

## 2016-02-01 NOTE — Progress Notes (Signed)
Per pt, no allergies to soy or egg products.Pt not taking any weight loss meds or using  O2 at home. 

## 2016-02-13 ENCOUNTER — Encounter: Payer: Self-pay | Admitting: Gastroenterology

## 2016-02-13 ENCOUNTER — Ambulatory Visit (AMBULATORY_SURGERY_CENTER): Payer: Medicare Other | Admitting: Gastroenterology

## 2016-02-13 VITALS — BP 105/61 | HR 68 | Temp 96.2°F | Resp 18 | Ht 74.0 in | Wt 260.0 lb

## 2016-02-13 DIAGNOSIS — Z86010 Personal history of colonic polyps: Secondary | ICD-10-CM

## 2016-02-13 DIAGNOSIS — D128 Benign neoplasm of rectum: Secondary | ICD-10-CM

## 2016-02-13 DIAGNOSIS — K621 Rectal polyp: Secondary | ICD-10-CM | POA: Diagnosis not present

## 2016-02-13 DIAGNOSIS — I2510 Atherosclerotic heart disease of native coronary artery without angina pectoris: Secondary | ICD-10-CM | POA: Diagnosis not present

## 2016-02-13 DIAGNOSIS — D129 Benign neoplasm of anus and anal canal: Secondary | ICD-10-CM

## 2016-02-13 MED ORDER — SODIUM CHLORIDE 0.9 % IV SOLN: 500.0000 mL | INTRAVENOUS | Status: DC

## 2016-02-13 NOTE — Op Note (Signed)
[place] Patient Name: Jeremy Woodward Procedure Date: 02/13/2016 8:49 AM MRN: [id number] Endoscopist: Mauri Pole , MD Age: 71 Referring MD:  Date of Birth: 1945-12-20 Gender: Male Account #: [id number] Procedure:                Colonoscopy Indications:              Surveillance: Personal history of adenomatous                            polyps on last colonoscopy > 5 years ago Medicines:                Monitored Anesthesia Care Procedure:                Pre-Anesthesia Assessment:                           - Prior to the procedure, a History and Physical                            was performed, and patient medications and                            allergies were reviewed. The patient's tolerance of                            previous anesthesia was also reviewed. The risks                            and benefits of the procedure and the sedation                            options and risks were discussed with the patient.                            All questions were answered, and informed consent                            was obtained. Prior Anticoagulants: The patient has                            taken no previous anticoagulant or antiplatelet                            agents. ASA Grade Assessment: II - A patient with                            mild systemic disease. After reviewing the risks                            and benefits, the patient was deemed in                            satisfactory condition to undergo the procedure.  After obtaining informed consent, the colonoscope                            was passed under direct vision. Throughout the                            procedure, the patient's blood pressure, pulse, and                            oxygen saturations were monitored continuously. The                            Model CF-HQ190L [id number]) scope was introduced                            through the anus and  advanced to the the terminal                            ileum, with identification of the appendiceal                            orifice and IC valve. The colonoscopy was performed                            without difficulty. The patient tolerated the                            procedure well. The quality of the bowel                            preparation was excellent. The terminal ileum,                            ileocecal valve, appendiceal orifice, and rectum                            were photographed. Scope In: 8:50:14 AM Scope Out: 9:09:24 AM Scope Withdrawal Time: 0 hours 15 minutes 32 seconds  Total Procedure Duration: 0 hours 19 minutes 10 seconds  Findings:                 The perianal and digital rectal examinations were                            normal.                           A 2 mm polyp was found in the rectum. The polyp was                            sessile. The polyp was removed with a cold biopsy                            forceps. Resection and retrieval were complete.  Non-bleeding internal hemorrhoids were found during                            retroflexion. The hemorrhoids were large.                           The exam was otherwise without abnormality. Complications:            No immediate complications. Estimated Blood Loss:     Estimated blood loss was minimal. Impression:               - One 2 mm polyp in the rectum, removed with a cold                            biopsy forceps. Resected and retrieved.                           - Non-bleeding internal hemorrhoids.                           - The examination was otherwise normal. Recommendation:           - Patient has a contact number available for                            emergencies. The signs and symptoms of potential                            delayed complications were discussed with the                            patient. Return to normal activities tomorrow.                             Written discharge instructions were provided to the                            patient.                           - Resume previous diet.                           - Continue present medications.                           - Await pathology results.                           - Repeat colonoscopy in 5-10 years for surveillance                            based on pathology results.                           - Return to GI clinic PRN. Mauri Pole, MD 02/13/2016 9:14:38 AM This report has been signed electronically.

## 2016-02-13 NOTE — Patient Instructions (Signed)
YOU HAD AN ENDOSCOPIC PROCEDURE TODAY AT THE [facility]:   Refer to the procedure report that was given to you for any specific questions about what was found during the examination.  If the procedure report does not answer your questions, please call your gastroenterologist to clarify.  If you requested that your care partner not be given the details of your procedure findings, then the procedure report has been included in a sealed envelope for you to review at your convenience later.  YOU SHOULD EXPECT: Some feelings of bloating in the abdomen. Passage of more gas than usual.  Walking can help get rid of the air that was put into your GI tract during the procedure and reduce the bloating. If you had a lower endoscopy (such as a colonoscopy or flexible sigmoidoscopy) you may notice spotting of blood in your stool or on the toilet paper. If you underwent a bowel prep for your procedure, you may not have a normal bowel movement for a few days.  Please Note:  You might notice some irritation and congestion in your nose or some drainage.  This is from the oxygen used during your procedure.  There is no need for concern and it should clear up in a day or so.  SYMPTOMS TO REPORT IMMEDIATELY:   Following lower endoscopy (colonoscopy or flexible sigmoidoscopy):  Excessive amounts of blood in the stool  Significant tenderness or worsening of abdominal pains  Swelling of the abdomen that is new, acute  Fever of 100F or higher   For urgent or emergent issues, a gastroenterologist can be reached at any hour by calling [phone]. Please read all handouts given to you by your recovery nurse.   DIET:  We do recommend a small meal at first, but then you may proceed to your regular diet.  Drink plenty of fluids but you should avoid alcoholic beverages for 24 hours.  ACTIVITY:  You should plan to take it easy for the rest of today and you should NOT DRIVE or use heavy machinery until  tomorrow (because of the sedation medicines used during the test).    FOLLOW UP: Our staff will call the number listed on your records the next business day following your procedure to check on you and address any questions or concerns that you may have regarding the information given to you following your procedure. If we do not reach you, we will leave a message.  However, if you are feeling well and you are not experiencing any problems, there is no need to return our call.  We will assume that you have returned to your regular daily activities without incident.  If any biopsies were taken you will be contacted by phone or by letter within the next 1-3 weeks.  Please call us at [phone] if you have not heard about the biopsies in 3 weeks.    SIGNATURES/CONFIDENTIALITY: You and/or your care partner have signed paperwork which will be entered into your electronic medical record.  These signatures attest to the fact that that the information above on your After Visit Summary has been reviewed and is understood.  Full responsibility of the confidentiality of this discharge information lies with you and/or your care-partner.  Thank you for letting us take care of your healthcare needs today. 

## 2016-02-13 NOTE — Progress Notes (Signed)
Called to room to assist during endoscopic procedure.  Patient ID and intended procedure confirmed with present staff. Received instructions for my participation in the procedure from the performing physician.  

## 2016-02-13 NOTE — Progress Notes (Signed)
Report given to PACU RN, vss 

## 2016-02-14 ENCOUNTER — Telehealth: Payer: Self-pay

## 2016-02-14 NOTE — Telephone Encounter (Signed)
  Follow up Call-  Call back number 02/13/2016  Post procedure Call Back phone  # [phone]  Permission to leave phone message Yes  Some recent data might be hidden     Patient questions:  Do you have a fever, pain , or abdominal swelling? No. Pain Score  0 *  Have you tolerated food without any problems? Yes.    Have you been able to return to your normal activities? Yes.    Do you have any questions about your discharge instructions: Diet   No. Medications  No. Follow up visit  No.  Do you have questions or concerns about your Care? No.  Actions: * If pain score is 4 or above: No action needed, pain <4.

## 2016-02-19 ENCOUNTER — Encounter: Payer: Self-pay | Admitting: Gastroenterology

## 2016-02-26 ENCOUNTER — Ambulatory Visit (INDEPENDENT_AMBULATORY_CARE_PROVIDER_SITE_OTHER): Payer: Medicare Other | Admitting: Gastroenterology

## 2016-02-26 ENCOUNTER — Encounter: Payer: Self-pay | Admitting: Gastroenterology

## 2016-02-26 VITALS — BP 124/74 | HR 72 | Ht 75.0 in | Wt 263.4 lb

## 2016-02-26 DIAGNOSIS — K588 Other irritable bowel syndrome: Secondary | ICD-10-CM | POA: Diagnosis not present

## 2016-02-26 DIAGNOSIS — K219 Gastro-esophageal reflux disease without esophagitis: Secondary | ICD-10-CM

## 2016-02-26 DIAGNOSIS — K591 Functional diarrhea: Secondary | ICD-10-CM | POA: Diagnosis not present

## 2016-02-26 DIAGNOSIS — R1310 Dysphagia, unspecified: Secondary | ICD-10-CM

## 2016-02-26 MED ORDER — PANTOPRAZOLE SODIUM 40 MG PO TBEC
40.0000 mg | DELAYED_RELEASE_TABLET | Freq: Every day | ORAL | 6 refills | Status: DC
Start: 2016-02-26 — End: 2017-04-05

## 2016-02-26 MED ORDER — DIPHENOXYLATE-ATROPINE 2.5-0.025 MG PO TABS: 1.0000 | ORAL_TABLET | Freq: Two times a day (BID) | ORAL | 0 refills | Status: DC

## 2016-02-26 NOTE — Patient Instructions (Addendum)
Lactose-Free Diet, Adult Introduction If you have lactose intolerance, you are not able to digest lactose. Lactose is a natural sugar found mainly in milk and milk products. You may need to avoid all foods and beverages that contain lactose. A lactose-free diet can help you do this. What do I need to know about this diet?  Do not consume foods, beverages, vitamins, minerals, or medicines with lactose. Read ingredients lists carefully.  Look for the words "lactose-free" on labels.  Use lactase enzyme drops or tablets as directed by your health care provider.  Use lactose-free milk or a milk alternative, such as soy milk, for drinking and cooking.  Make sure you get enough calcium and vitamin D in your diet. A lactose-free eating plan can be lacking in these important nutrients.  Take calcium and vitamin D supplements as directed by your health care provider. Talk to your provider about supplements if you are not able to get enough calcium and vitamin D from food. Which foods have lactose? Lactose is found in:  Milk and foods made from milk.  Yogurt.  Cheese.  Butter.  Margarine.  Sour cream.  Cream.  Whipped toppings and nondairy creamers.  Ice cream and other milk-based desserts. Lactose is also found in foods or products made with milk or milk ingredients. To find out whether a food contains milk or a milk ingredient, look at the ingredients list. Avoid foods with the statement "May contain milk" and foods that contain:  Butter.  Cream.  Milk.  Milk solids.  Milk powder.  Whey.  Curd.  Caseinate.  Lactose.  Lactalbumin.  Lactoglobulin. What are some alternatives to milk and foods made with milk products?  Lactose-free milk.  Soy milk with added calcium and vitamin D.  Almond, coconut, or rice milk with added calcium and vitamin D. Note that these are low in protein.  Soy products, such as soy yogurt, soy cheese, soy ice cream, and soy-based sour  cream. Which foods can I eat? Grains  Breads and rolls made without milk, such as [place], [place], or [place] bread, bagels, pita, and [employer]. Corn tortillas, corn meal, grits, and polenta. Crackers without lactose or milk solids, such as soda crackers and graham crackers. Cooked or dry cereals without lactose or milk solids. Pasta, quinoa, couscous, barley, oats, bulgur, farro, rice, wild rice, or other grains prepared without milk or lactose. Plain popcorn. Vegetables  Fresh, frozen, and canned vegetables without cheese, cream, or butter sauces. Fruits  All fresh, canned, frozen, or dried fruits that are not processed with lactose. Meats and Other Protein Sources  Plain beef, chicken, fish, [place], lamb, veal, pork, wild game, or ham. Kosher-prepared meat products. Strained or junior meats that do not contain milk. Eggs. Soy meat substitutes. Beans, lentils, and hummus. Tofu. Nuts and seeds. Peanut or other nut butters without lactose. Soups, casseroles, and mixed dishes without cheese, cream, or milk. Dairy  Lactose-free milk. Soy, rice, or almond milk with added calcium and vitamin D. Soy cheese and yogurt. Beverages  Carbonated drinks. Tea. Coffee, freeze-dried coffee, and some instant coffees. Fruit and vegetable juices. Condiments  Soy sauce. Carob powder. Olives. Gravy made with water. Baker's cocoa. Angie Fava. Pure seasonings and spices. Ketchup. Mustard. Bouillon. Broth. Sweets and Desserts  Water and fruit ices. Gelatin. Cookies, pies, or cakes made from allowed ingredients, such as angel food cake. Pudding made with water or a milk substitute. Lactose-free tofu desserts. Soy, coconut milk, or rice-milk-based frozen desserts. Sugar. Honey. Jam, jelly, and marmalade.  Molasses. Pure sugar candy. Dark chocolate without milk. Marshmallows. Fats and Oils  Margarines and salad dressings that do not contain milk. Berniece Salines. Vegetable oils. Shortening. Mayonnaise. Soy or coconut-based cream. The items  listed above may not be a complete list of recommended foods or beverages. Contact your dietitian for more options.  Which foods are not recommended? Grains  Breads and rolls that contain milk. Toaster pastries. Muffins, biscuits, waffles, cornbread, and pancakes. These can be prepared at home, commercial, or from mixes. Sweet rolls, donuts, English muffins, fry bread, lefse, flour tortillas with lactose, or [place] toast made with milk or milk ingredients. Crackers that contain lactose. Corn curls. Cooked or dry cereals with lactose. Vegetables  Creamed or breaded vegetables. Vegetables in a cheese or butter sauce or with lactose-containing margarines. Instant potatoes. [place] fries. Scalloped or au gratin potatoes. Fruits  None. Meats and Other Protein Sources  Scrambled eggs, omelets, and souffles that contain milk. Creamed or breaded meat, fish, chicken, or [place]. Sausage products, such as wieners and liver sausage. Cold cuts that contain milk solids. Cheese, cottage cheese, ricotta cheese, and cheese spreads. Lasagna and macaroni and cheese. Pizza. Peanut or other nut butters with added milk solids. Casseroles or mixed dishes containing milk or cheese. Dairy  All dairy products, including milk, goat's milk, buttermilk, kefir, acidophilus milk, flavored milk, evaporated milk, condensed milk, dulce de [place], eggnog, yogurt, cheese, and cheese spreads. Beverages  Hot chocolate. Cocoa with lactose. Instant iced teas. Powdered fruit drinks. Smoothies made with milk or yogurt. Condiments  Chewing gum that has lactose. Cocoa that has lactose. Spice blends if they contain milk products. Artificial sweeteners that contain lactose. Nondairy creamers. Sweets and Desserts  Ice cream, ice milk, gelato, sherbet, and frozen yogurt. Custard, pudding, and mousse. Cake, cream pies, cookies, and other desserts containing milk, cream, cream cheese, or milk chocolate. Pie crust made with milk-containing margarine  or butter. Reduced-calorie desserts made with a sugar substitute that contains lactose. Toffee and butterscotch. Milk, white, or dark chocolate that contains milk. Fudge. Caramel. Fats and Oils  Margarines and salad dressings that contain milk or cheese. Cream. Half and half. Cream cheese. Sour cream. Chip dips made with sour cream or yogurt. The items listed above may not be a complete list of foods and beverages to avoid. Contact your dietitian for more information.  Am I getting enough calcium? Calcium is found in many foods that contain lactose and is important for bone health. The amount of calcium you need depends on your age:  Adults younger than 50 years: 1000 mg of calcium a day.  Adults older than 50 years: 1200 mg of calcium a day. If you are not getting enough calcium, other calcium sources include:  Orange juice with calcium added. There are 300-350 mg of calcium in 1 cup of orange juice.  Sardines with edible bones. There are 325 mg of calcium in 3 oz of sardines.  Calcium-fortified soy milk. There are 300-400 mg of calcium in 1 cup of calcium-fortified soy milk.  Calcium-fortified rice or almond milk. There are 300 mg of calcium in 1 cup of calcium-fortified rice or almond milk.  Canned salmon with edible bones. There are 180 mg of calcium in 3 oz of canned salmon with edible bones.  Calcium-fortified breakfast cereals. There are [phone] mg of calcium in calcium-fortified breakfast cereals.  Tofu set with calcium sulfate. There are 250 mg of calcium in  cup of tofu set with calcium sulfate.  Spinach, cooked. There are  145 mg of calcium in  cup of cooked spinach.  Edamame, cooked. There are 130 mg of calcium in  cup of cooked edamame.  Collard greens, cooked. There are 125 mg of calcium in  cup of cooked collard greens.  Kale, frozen or cooked. There are 90 mg of calcium in  cup of cooked or frozen kale.  Almonds. There are 95 mg of calcium in  cup of  almonds.  Broccoli, cooked. There are 60 mg of calcium in 1 cup of cooked broccoli. This information is not intended to replace advice given to you by your health care provider. Make sure you discuss any questions you have with your health care provider. Document Released: 07/06/2001 Document Revised: 06/22/2015 Document Reviewed: 04/16/2013  2017 Elsevier  You have been scheduled for an endoscopy. Please follow written instructions given to you at your visit today. If you use inhalers (even only as needed), please bring them with you on the day of your procedure. Your physician has requested that you go to www.startemmi.com and enter the access code given to you at your visit today. This web site gives a general overview about your procedure. However, you should still follow specific instructions given to you by our office regarding your preparation for the procedure.  WE HAVE SENT YOUR PRESCRIPTIONS TO YOUR PHARMACY   .dottie

## 2016-02-26 NOTE — Progress Notes (Signed)
Jeremy [facility]    [id number]    01/07/1946  Primary Care Physician:METHENEY,Jeremy [facility]  Referring Physician: [name], [facility] [name], [place] [address]  Chief complaint: Dysphagia, GERD,   HPI: 71 year old male previously followed by Jeremy [facility], was last seen in our office in February 2014 is here for evaluation with complaints of dysphagia. He has had intermittent episodes of dysphagia for past 2-3 years, occasional choking episodes about 3-4 times a month. He develops chest tightness associated with burning and prevents anything going down including food or water, improves with Tums or Pepto-Bismol. He reports dysphagia is worse with meat, raw vegetables, sometimes with bread. Never had an endoscopy. Denies any odynophagia, nausea, vomiting, abdominal pain, melena or blood per rectum  He complains of intermittent sporadic episodes of diarrhea, that usually improves with Lomotil, he hasn't changed his diet. He does drink milk and eats cheese and dairy products.  Colonoscopy 02/13/2016 with removal of small diminutive rectal polyp, hyperplastic. Prior to that colonoscopy in 2012 with removal of a small polyp tubular adenoma  Outpatient Encounter Prescriptions as of 02/26/2016  Medication Sig  . aspirin 81 MG tablet Take 81 mg by mouth daily.    Marland Kitchen atorvastatin (LIPITOR) 80 MG tablet Take 1 tablet (80 mg total) by mouth daily.  . Cholecalciferol (VITAMIN D3) 2000 units TABS Take 2,000 Units by mouth daily.  . citalopram (CELEXA) 10 MG tablet TAKE 1 TABLET EVERY DAY AS NEEDED  . diphenoxylate-atropine (LOMOTIL) 2.5-0.025 MG/5ML liquid Take 5 mLs by mouth 4 (four) times daily as needed for diarrhea or loose stools.  . Ibuprofen (ADVIL PO) Take by mouth as needed (pain).  Marland Kitchen losartan (COZAAR) 50 MG tablet TAKE 1 TABLET BY MOUTH EVERY DAY  . Omega-3 Fatty Acids (FISH OIL) 1000 MG CAPS Take 1,000 mg by mouth daily.   . phentermine  (ADIPEX-P) 37.5 MG tablet Take 1 tablet (37.5 mg total) by mouth daily before breakfast.  . Saw Palmetto, Serenoa repens, (SAW PALMETTO PO) Take 450 mg by mouth 2 (two) times daily.  Marland Kitchen zolpidem (AMBIEN) 10 MG tablet Take 1 tablet (10 mg total) by mouth at bedtime as needed. for sleep   Facility-Administered Encounter Medications as of 02/26/2016  Medication  . 0.9 %  sodium chloride infusion    Allergies as of 02/26/2016  . (No Known Allergies)    Past Medical History:  Diagnosis Date  . Arthritis   . BPH (benign prostatic hypertrophy)   . CAD (coronary artery disease) 2010   Blockage in artery/ widow maker  . CHF (congestive heart failure) ([facility])   . Depression   . Diarrhea   . Hyperlipidemia   . Hypertension   . Obesity   . OSA on CPAP   . Pleurisy   . Sleep apnea     Past Surgical History:  Procedure Laterality Date  . LEFT AND RIGHT HEART CATHETERIZATION WITH CORONARY ANGIOGRAM N/A 03/05/2011   Procedure: LEFT AND RIGHT HEART CATHETERIZATION WITH CORONARY ANGIOGRAM;  Surgeon: Larey Dresser, [place];  Location: [facility] CATH LAB;  Service: Cardiovascular;  Laterality: N/A;  . [place]   right with drainage utilizing VAT for empyema  . TONSILLECTOMY      Family History  Problem Relation Age of Onset  . Cancer Father 3    liver and lung  . Heart disease Mother   . Ulcers Mother   . Hypertension  Mother   . Aneurysm Mother   . Hyperlipidemia Sister   . Hypertension Sister   . Colon cancer Maternal Grandmother     late 40's when dx   . Esophageal cancer Neg Hx   . Stomach cancer Neg Hx   . Rectal cancer Neg Hx     Social History   Social History  . Marital status: Married    Spouse name: N/A  . Number of children: 1  . Years of education: N/A   Occupational History  . Retired    Social History Main Topics  . Smoking status: Never Smoker  . Smokeless tobacco: Never Used     Comment: cigars  . Alcohol use No  . Drug use: No  . Sexual activity:  Not on file   Other Topics Concern  . Not on file   Social History Narrative  . No narrative on file      Review of systems: Review of Systems  Constitutional: Negative for fever and chills. Lack of energy HENT: Negative.   Eyes: Negative for blurred vision.  Respiratory: Negative for cough, shortness of breath and wheezing.   Cardiovascular: Negative for chest pain and palpitations.  Gastrointestinal: as per HPI Genitourinary: Negative for dysuria, urgency, frequency and hematuria.  Musculoskeletal: Positive for myalgias, back pain and joint pain.  Skin: Negative for itching and rash.  Neurological: Negative for dizziness, tremors, focal weakness, seizures and loss of consciousness.  Endo/Heme/Allergies: Negative for seasonal allergies.  Psychiatric/Behavioral: Negative for depression, suicidal ideas and hallucinations.  All other systems reviewed and are negative.   Physical Exam: Vitals:   02/26/16 0857  BP: 124/74  Pulse: 72   Body mass index is 32.92 kg/m. Gen:      No acute distress HEENT:  EOMI, sclera anicteric Neck:     No masses; no thyromegaly Lungs:    Clear to auscultation bilaterally; normal respiratory effort CV:         Regular rate and rhythm; no murmurs Abd:      + bowel sounds; soft, non-tender; no palpable masses, no distension Ext:    No edema; adequate peripheral perfusion Skin:      Warm and dry; no rash Neuro: alert and oriented x 3 Psych: normal mood and affect  Data Reviewed:  Reviewed labs, radiology imaging, old records and pertinent past GI work up   Assessment and Plan/Recommendations:  71 year old male with obesity, obstructive sleep apnea here with complaints of intermittent dysphagia to solids and heartburn  Dysphagia: Schedule for EGD to evaluate for possible esophagitis, peptic stricture, malignancy The risks and benefits as well as alternatives of endoscopic procedure(s) have been discussed and reviewed. All questions  answered. The patient agrees to proceed.  GERD: Protonix 40 mg daily, instructed patient to take 30 minutes before breakfast Antireflux measures  Intermittent diarrhea: Likely functional or associated with diet Advise patient to follow a lactose-free diet for 2 weeks to see if has any improvement of symptoms Okay to use Lomotil as needed  Greater than 50% of the time used for counseling as well as treatment plan and follow-up. He had multiple questions which were answered to his satisfaction  K. Denzil Magnuson , [place] [phone] Mon-Fri 8a-5p [phone] after 5p, weekends, holidays  CC: Jeremy [facility], *

## 2016-02-27 ENCOUNTER — Encounter: Payer: BLUE CROSS/BLUE SHIELD | Admitting: Gastroenterology

## 2016-03-20 ENCOUNTER — Ambulatory Visit (AMBULATORY_SURGERY_CENTER): Payer: Medicare Other | Admitting: Gastroenterology

## 2016-03-20 ENCOUNTER — Encounter: Payer: Self-pay | Admitting: Gastroenterology

## 2016-03-20 VITALS — BP 110/59 | HR 65 | Temp 97.5°F | Resp 18 | Ht 75.0 in | Wt 263.0 lb

## 2016-03-20 DIAGNOSIS — K222 Esophageal obstruction: Secondary | ICD-10-CM

## 2016-03-20 DIAGNOSIS — R1310 Dysphagia, unspecified: Secondary | ICD-10-CM

## 2016-03-20 MED ORDER — SODIUM CHLORIDE 0.9 % IV SOLN: 500.0000 mL | INTRAVENOUS | Status: DC

## 2016-03-20 NOTE — Progress Notes (Signed)
Dental advisory given to patient 

## 2016-03-20 NOTE — Op Note (Signed)
[facility] Patient Name: Jeremy [facility] Procedure Date: 03/20/2016 10:23 AM MRN: [id number] Endoscopist: Mauri Pole , MD Age: 71 Referring MD:  Date of Birth: 08/25/1945 Gender: Male Account #: [id number] Procedure:                Upper GI endoscopy Indications:              Dysphagia Medicines:                Monitored Anesthesia Care Procedure:                Pre-Anesthesia Assessment:                           - Prior to the procedure, a History and Physical                            was performed, and patient medications and                            allergies were reviewed. The patient's tolerance of                            previous anesthesia was also reviewed. The risks                            and benefits of the procedure and the sedation                            options and risks were discussed with the patient.                            All questions were answered, and informed consent                            was obtained. Prior Anticoagulants: The patient has                            taken no previous anticoagulant or antiplatelet                            agents. ASA Grade Assessment: II - A patient with                            mild systemic disease. After reviewing the risks                            and benefits, the patient was deemed in                            satisfactory condition to undergo the procedure.                           After obtaining informed consent, the endoscope was  passed under direct vision. Throughout the                            procedure, the patient's blood pressure, pulse, and                            oxygen saturations were monitored continuously. The                            Model GIF-[id number]) scope was introduced                            through the mouth, and advanced to the second part                            of duodenum. The upper GI endoscopy was                             accomplished without difficulty. The patient                            tolerated the procedure well. Scope In: Scope Out: Findings:                 One mild benign-appearing, intrinsic stenosis was                            found 39 to 40 cm from the incisors. This measured                            less than one cm (in length) and was traversed. A                            guidewire was placed and the scope was withdrawn.                            Dilation was performed with a Savary dilator with                            no resistance at 16 mm and mild resistance at 17 mm.                           The entire examined stomach was normal.                           The examined duodenum was normal. Complications:            No immediate complications. Estimated Blood Loss:     Estimated blood loss: none. Impression:               - Benign-appearing esophageal stenosis. Dilated.                           - Normal stomach.                           -  Normal examined duodenum.                           - No specimens collected. Recommendation:           - Patient has a contact number available for                            emergencies. The signs and symptoms of potential                            delayed complications were discussed with the                            patient. Return to normal activities tomorrow.                            Written discharge instructions were provided to the                            patient.                           - Resume previous diet.                           - Continue present medications.                           - Repeat upper endoscopy PRN for retreatment.                           - Return to GI clinic PRN. Mauri Pole, MD 03/20/2016 10:48:28 AM This report has been signed electronically.

## 2016-03-20 NOTE — Progress Notes (Signed)
Called to room to assist during endoscopic procedure.  Patient ID and intended procedure confirmed with present staff. Received instructions for my participation in the procedure from the performing physician.  

## 2016-03-20 NOTE — Progress Notes (Signed)
A/ox3 pleased with MAC, report to [facility]

## 2016-03-20 NOTE — Patient Instructions (Signed)
YOU HAD AN ENDOSCOPIC PROCEDURE TODAY AT [facility]:   Refer to the procedure report that was given to you for any specific questions about what was found during the examination.  If the procedure report does not answer your questions, please call your gastroenterologist to clarify.  If you requested that your care partner not be given the details of your procedure findings, then the procedure report has been included in a sealed envelope for you to review at your convenience later.  YOU SHOULD EXPECT: Some feelings of bloating in the abdomen. Passage of more gas than usual.  Walking can help get rid of the air that was put into your GI tract during the procedure and reduce the bloating. If you had a lower endoscopy (such as a colonoscopy or flexible sigmoidoscopy) you may notice spotting of blood in your stool or on the toilet paper. If you underwent a bowel prep for your procedure, you may not have a normal bowel movement for a few days.  Please Note:  You might notice some irritation and congestion in your nose or some drainage.  This is from the oxygen used during your procedure.  There is no need for concern and it should clear up in a day or so.  SYMPTOMS TO REPORT IMMEDIATELY:     Following upper endoscopy (EGD)  Vomiting of blood or coffee ground material  New chest pain or pain under the shoulder blades  Painful or persistently difficult swallowing  New shortness of breath  Fever of 100F or higher  Black, tarry-looking stools  For urgent or emergent issues, a gastroenterologist can be reached at any hour by calling [phone].   DIET:    ACTIVITY:  You should plan to take it easy for the rest of today and you should NOT DRIVE or use heavy machinery until tomorrow (because of the sedation medicines used during the test).    FOLLOW UP: Our staff will call the number listed on your records the next business day following your procedure to check on you and  address any questions or concerns that you may have regarding the information given to you following your procedure. If we do not reach you, we will leave a message.  However, if you are feeling well and you are not experiencing any problems, there is no need to return our call.  We will assume that you have returned to your regular daily activities without incident.  If any biopsies were taken you will be contacted by phone or by letter within the next 1-3 weeks.  Please call us at [phone] if you have not heard about the biopsies in 3 weeks.    SIGNATURES/CONFIDENTIALITY: You and/or your care partner have signed paperwork which will be entered into your electronic medical record.  These signatures attest to the fact that that the information above on your After Visit Summary has been reviewed and is understood.  Full responsibility of the confidentiality of this discharge information lies with you and/or your care-partner.   Dilatation diet today per Dr Silverio Decamp ( given to you)   Continue previous medications

## 2016-03-21 ENCOUNTER — Telehealth: Payer: Self-pay

## 2016-03-21 NOTE — Telephone Encounter (Signed)
  Follow up Call-  Call back number 03/20/2016 02/13/2016  Post procedure Call Back phone  # [phone]  Permission to leave phone message Yes Yes  Some recent data might be hidden     Patient questions:  Do you have a fever, pain , or abdominal swelling? No. Pain Score  0 *  Have you tolerated food without any problems? Yes.    Have you been able to return to your normal activities? Yes.    Do you have any questions about your discharge instructions: Diet   No. Medications  No. Follow up visit  No.  Do you have questions or concerns about your Care? No.  Actions: * If pain score is 4 or above: No action needed, pain <4.

## 2016-07-18 ENCOUNTER — Other Ambulatory Visit: Payer: Self-pay | Admitting: Family Medicine

## 2016-07-18 DIAGNOSIS — R635 Abnormal weight gain: Secondary | ICD-10-CM

## 2016-07-22 ENCOUNTER — Encounter: Payer: Self-pay | Admitting: Family Medicine

## 2016-07-29 ENCOUNTER — Encounter: Payer: Self-pay | Admitting: Family Medicine

## 2016-07-29 ENCOUNTER — Ambulatory Visit (INDEPENDENT_AMBULATORY_CARE_PROVIDER_SITE_OTHER): Payer: Medicare Other | Admitting: Family Medicine

## 2016-07-29 VITALS — BP 124/60 | HR 70 | Resp 18 | Wt 270.0 lb

## 2016-07-29 DIAGNOSIS — Z6833 Body mass index (BMI) 33.0-33.9, adult: Secondary | ICD-10-CM | POA: Diagnosis not present

## 2016-07-29 DIAGNOSIS — R635 Abnormal weight gain: Secondary | ICD-10-CM | POA: Diagnosis not present

## 2016-07-29 DIAGNOSIS — E7800 Pure hypercholesterolemia, unspecified: Secondary | ICD-10-CM | POA: Diagnosis not present

## 2016-07-29 DIAGNOSIS — I10 Essential (primary) hypertension: Secondary | ICD-10-CM | POA: Diagnosis not present

## 2016-07-29 MED ORDER — PHENTERMINE HCL 37.5 MG PO TABS: 37.5000 mg | ORAL_TABLET | Freq: Every day | ORAL | 0 refills | Status: DC

## 2016-07-29 NOTE — Progress Notes (Signed)
Subjective:    Patient ID: Jeremy Woodward, male    DOB: 07-15-1945, 71 y.o.   MRN: [id number]  HPI  Abnormal weight gain - Patient wants to discuss weight loss options. He is producing been on phentermine a couple of times. First with his previous provider in our office and then again back about 6 months ago. He tolerated it well. He would like to restart the medication.Marland Kitchen He started it but then had to have a colonoscopy. He started again and then they had to have an endoscopy. And at that point he just did not get back on it. He has been starting to work on watching his portion sizes and has been exercising some but not as consistently as he was previously.  Hypertension- Pt denies chest pain, SOB, dizziness, or heart palpitations.  Taking meds as directed w/o problems.  Denies medication side effects.    He also wanted to see if he did get a copy of his hepatitis C screening. He needs a copy of it for the [place]. He's pre-sure that he's had it done but they keep saying he needs it done.  Hyperlipidemia-tolerate statin well without any side effects or problems.  Review of Systems   BP 124/60   Pulse 70   Resp 18   Wt 270 lb (122.5 kg)   BMI 33.75 kg/m     No Known Allergies  Past Medical History:  Diagnosis Date  . Arthritis   . BPH (benign prostatic hypertrophy)   . CAD (coronary artery disease) 2010   Blockage in artery/ widow maker  . CHF (congestive heart failure) ([place])   . Depression   . Diarrhea   . Hyperlipidemia   . Hypertension   . Obesity   . OSA on CPAP   . Pleurisy   . Sleep apnea     Past Surgical History:  Procedure Laterality Date  . LEFT AND RIGHT HEART CATHETERIZATION WITH CORONARY ANGIOGRAM N/A 03/05/2011   Procedure: LEFT AND RIGHT HEART CATHETERIZATION WITH CORONARY ANGIOGRAM;  Surgeon: Larey Dresser, MD;  Location: [facility] CATH LAB;  Service: Cardiovascular;  Laterality: N/A;  . [place]   right with drainage utilizing VAT for empyema  .  TONSILLECTOMY      Social History   Social History  . Marital status: Married    Spouse name: N/A  . Number of children: 1  . Years of education: N/A   Occupational History  . Retired    Social History Main Topics  . Smoking status: Never Smoker  . Smokeless tobacco: Never Used     Comment: cigars  . Alcohol use No  . Drug use: No  . Sexual activity: Not on file   Other Topics Concern  . Not on file   Social History Narrative  . No narrative on file    Family History  Problem Relation Age of Onset  . Cancer Father 66       liver and lung  . Heart disease Mother   . Ulcers Mother   . Hypertension Mother   . Aneurysm Mother   . Hyperlipidemia Sister   . Hypertension Sister   . Colon cancer Maternal Grandmother        late 39's when dx   . Esophageal cancer Neg Hx   . Stomach cancer Neg Hx   . Rectal cancer Neg Hx     Outpatient Encounter Prescriptions as of 07/29/2016  Medication Sig  . aspirin 81  MG tablet Take 81 mg by mouth daily.    Marland Kitchen atorvastatin (LIPITOR) 80 MG tablet Take 1 tablet (80 mg total) by mouth daily.  . Cholecalciferol (VITAMIN D3) 2000 units TABS Take 2,000 Units by mouth daily.  . Ibuprofen (ADVIL PO) Take by mouth as needed (pain).  Marland Kitchen losartan (COZAAR) 50 MG tablet TAKE 1 TABLET BY MOUTH EVERY DAY  . Omega-3 Fatty Acids (FISH OIL) 1000 MG CAPS Take 1,000 mg by mouth daily.   . pantoprazole (PROTONIX) 40 MG tablet Take 1 tablet (40 mg total) by mouth daily.  . phentermine (ADIPEX-P) 37.5 MG tablet Take 1 tablet (37.5 mg total) by mouth daily before breakfast.  . Saw Palmetto, Serenoa repens, (SAW PALMETTO PO) Take 450 mg by mouth 2 (two) times daily.  Marland Kitchen zolpidem (AMBIEN) 10 MG tablet Take 1 tablet (10 mg total) by mouth at bedtime as needed. for sleep  . [DISCONTINUED] citalopram (CELEXA) 10 MG tablet TAKE 1 TABLET EVERY DAY AS NEEDED (Patient not taking: Reported on 03/20/2016)  . [DISCONTINUED] diphenoxylate-atropine (LOMOTIL) 2.5-0.025 MG  tablet Take 1 tablet by mouth 2 (two) times daily. EVERY 12 HOURS AS NEEDED (Patient not taking: Reported on 03/20/2016)  . [DISCONTINUED] diphenoxylate-atropine (LOMOTIL) 2.5-0.025 MG/5ML liquid Take 5 mLs by mouth 4 (four) times daily as needed for diarrhea or loose stools. (Patient not taking: Reported on 03/20/2016)  . [DISCONTINUED] phentermine (ADIPEX-P) 37.5 MG tablet Take 1 tablet (37.5 mg total) by mouth daily before breakfast. (Patient not taking: Reported on 03/20/2016)   Facility-Administered Encounter Medications as of 07/29/2016  Medication  . 0.9 %  sodium chloride infusion  . 0.9 %  sodium chloride infusion          Objective:   Physical Exam  Constitutional: He is oriented to person, place, and time. He appears well-developed and well-nourished.  HENT:  Head: Normocephalic and atraumatic.  Cardiovascular: Normal rate, regular rhythm and normal heart sounds.   Pulmonary/Chest: Effort normal and breath sounds normal.  Neurological: He is alert and oriented to person, place, and time.  Skin: Skin is warm and dry.  Psychiatric: He has a normal mood and affect. His behavior is normal.        Assessment & Plan:  Abnormal weight gain/BMI - will refill phentermine. In about the potential for side effects. I am concerned about him taking this at his age and we discussed some non-stimulant options such as bowel be and country. He will check with his insurance company to see if these are covered. I think it would be much safer options long-term. Did encourage him to work on increasing his exercise level and continuing to work on portion control. Decrease carbohydrate intake and increased protein  HTN - Well controlled. Continue current regimen. Follow up in  6 months.   Hyperlipidemia-due to recheck lipid panel. Wait until after July 10 for insurance purposes.  Discussed need for shingles vaccine. H.O. Provided.

## 2016-09-03 ENCOUNTER — Ambulatory Visit (INDEPENDENT_AMBULATORY_CARE_PROVIDER_SITE_OTHER): Payer: Medicare Other | Admitting: Family Medicine

## 2016-09-03 VITALS — BP 117/57 | HR 74 | Ht 75.0 in | Wt 259.0 lb

## 2016-09-03 DIAGNOSIS — Z6832 Body mass index (BMI) 32.0-32.9, adult: Secondary | ICD-10-CM | POA: Diagnosis not present

## 2016-09-03 DIAGNOSIS — E7800 Pure hypercholesterolemia, unspecified: Secondary | ICD-10-CM | POA: Diagnosis not present

## 2016-09-03 DIAGNOSIS — R635 Abnormal weight gain: Secondary | ICD-10-CM

## 2016-09-03 DIAGNOSIS — I10 Essential (primary) hypertension: Secondary | ICD-10-CM | POA: Diagnosis not present

## 2016-09-03 MED ORDER — PHENTERMINE HCL 37.5 MG PO TABS: 37.5000 mg | ORAL_TABLET | Freq: Every day | ORAL | 0 refills | Status: DC

## 2016-09-03 NOTE — Progress Notes (Signed)
   Subjective:    Patient ID: Jeremy Woodward, male    DOB: [birth year], 71 y.o.   MRN: [id number]  HPI  Pt was in today for a BP and weight check. Denies any trouble sleeping, palpitations, or medication problems. Discussed pt contacting insurance to find out if alternate medications are covered. Pt states that the alternate medications will be nearly $300.   Review of Systems     Objective:   Physical Exam        Assessment & Plan:  Pt has lost weight. A refill for phentermine will be faxed to pharmacy. Pt advised to FU with nurse visit in 30 days.

## 2016-09-03 NOTE — Progress Notes (Signed)
An weight gain-doing well overall. Down 11 pounds. BMI down to 32. Continue current regimen follow-up in one month.  Beatrice Lecher, MD

## 2016-09-04 ENCOUNTER — Encounter: Payer: Self-pay | Admitting: Family Medicine

## 2016-09-04 DIAGNOSIS — N183 Chronic kidney disease, stage 3 unspecified: Secondary | ICD-10-CM | POA: Insufficient documentation

## 2016-09-04 LAB — LIPID PANEL
CHOL/HDL RATIO: 3.1 ratio (ref <5.0)
Cholesterol: 112 mg/dL (ref <200)
HDL: 36 mg/dL — ABNORMAL LOW (ref >40)
LDL CALC: 43 mg/dL (ref <100)
Triglycerides: 164 mg/dL — ABNORMAL HIGH (ref <150)
VLDL: 33 mg/dL — ABNORMAL HIGH (ref <30)

## 2016-09-04 LAB — COMPLETE METABOLIC PANEL WITH GFR
ALBUMIN: 4.3 g/dL (ref 3.6–5.1)
ALK PHOS: 62 U/L (ref 40–115)
ALT: 27 U/L (ref 9–46)
AST: 22 U/L (ref 10–35)
BUN: 20 mg/dL (ref 7–25)
CO2: 22 mmol/L (ref 20–32)
Calcium: 9.6 mg/dL (ref 8.6–10.3)
Chloride: 105 mmol/L (ref 98–110)
Creat: 1.28 mg/dL — ABNORMAL HIGH (ref 0.70–1.18)
GFR, EST NON AFRICAN AMERICAN: 56 mL/min — AB (ref >=60)
GFR, Est African American: 64 mL/min (ref >=60)
GLUCOSE: 87 mg/dL (ref 65–99)
POTASSIUM: 4 mmol/L (ref 3.5–5.3)
SODIUM: 138 mmol/L (ref 135–146)
Total Bilirubin: 0.7 mg/dL (ref 0.2–1.2)
Total Protein: 6.4 g/dL (ref 6.1–8.1)

## 2016-09-05 ENCOUNTER — Other Ambulatory Visit: Payer: Self-pay | Admitting: Family Medicine

## 2016-09-05 ENCOUNTER — Other Ambulatory Visit: Payer: Self-pay | Admitting: Cardiology

## 2016-09-05 DIAGNOSIS — R635 Abnormal weight gain: Secondary | ICD-10-CM

## 2016-09-05 DIAGNOSIS — E785 Hyperlipidemia, unspecified: Secondary | ICD-10-CM

## 2016-10-16 ENCOUNTER — Other Ambulatory Visit: Payer: Self-pay | Admitting: Cardiology

## 2016-10-16 DIAGNOSIS — E785 Hyperlipidemia, unspecified: Secondary | ICD-10-CM

## 2016-10-23 ENCOUNTER — Ambulatory Visit (INDEPENDENT_AMBULATORY_CARE_PROVIDER_SITE_OTHER): Payer: Medicare Other | Admitting: Physician Assistant

## 2016-10-23 ENCOUNTER — Encounter: Payer: Self-pay | Admitting: Physician Assistant

## 2016-10-23 VITALS — BP 124/78 | HR 71 | Ht 75.0 in | Wt 262.4 lb

## 2016-10-23 DIAGNOSIS — I10 Essential (primary) hypertension: Secondary | ICD-10-CM

## 2016-10-23 DIAGNOSIS — I2510 Atherosclerotic heart disease of native coronary artery without angina pectoris: Secondary | ICD-10-CM

## 2016-10-23 DIAGNOSIS — R0609 Other forms of dyspnea: Secondary | ICD-10-CM | POA: Diagnosis not present

## 2016-10-23 DIAGNOSIS — Z9989 Dependence on other enabling machines and devices: Secondary | ICD-10-CM

## 2016-10-23 DIAGNOSIS — E7800 Pure hypercholesterolemia, unspecified: Secondary | ICD-10-CM | POA: Diagnosis not present

## 2016-10-23 DIAGNOSIS — G4733 Obstructive sleep apnea (adult) (pediatric): Secondary | ICD-10-CM | POA: Diagnosis not present

## 2016-10-23 MED ORDER — LOSARTAN POTASSIUM 50 MG PO TABS: 50.0000 mg | ORAL_TABLET | Freq: Every day | ORAL | 3 refills | Status: DC

## 2016-10-23 MED ORDER — SIMVASTATIN 40 MG PO TABS: 40.0000 mg | ORAL_TABLET | Freq: Every day | ORAL | 1 refills | Status: DC

## 2016-10-23 NOTE — Progress Notes (Signed)
Cardiology Office Note    Date:  10/24/2016   ID:  Jeremy Woodward, DOB 07-Apr-1945, MRN [id number]  PCP:  [name], MD  Cardiologist:  Dr. Stanford Breed  Chief Complaint  Patient presents with  . Follow-up    seen for Dr. Stanford Breed    History of Present Illness:  Jeremy Woodward is a 71 y.o. male with PMH of CAD, HTN, HLD, OSA on CPAP, and obesity. He has a history of emphysema and underwent VATS/decortication. He has long-standing exertional shortness of breath. Abdominal ultrasound in 2010 showed no aneurysm. CTA in 2010 showed no PE. Left and right heart cath performed in 11/2008 shows normal left and right filling pressure with normal pulmonary hypertension, 50% left main, 50% proximal LAD, he had IVUS and FFRI of both lesions and both were not hemodynamically significant. He had a cardiopulmonary stress test in December 2010 for assessment of persistent shortness of breath with exertion. This actually suggestive of possible ischemia. He eventually underwent an treadmill Myoview in December 2010 as well. He became short of breath after exercising 8 minutes, there was no EKG changes or any evidence of ischemia or infarction. Continued medical therapy was recommended. He had another treadmill Myoview a year later in December 2011 that also showed no ischemia or infarction. Left and right heart cath performed in 2013 showed normal right and left filling pressure, stable to improved coronary artery disease with 40% distal left main, 40-50% proximal LAD lesion. He was last seen by Dr. Stanford Breed on 06/21/2015, he was doing well at the time.  Based on the primary care physician's note, he has been trying phentermine recently for weight loss. Patient presents today for cardiogenic office visit. He his baseline dyspnea on exertion has not changed in the past year. He has minimal amount of lower extremity edema, he does not need a diuretic for this. He denies any significant chest discomfort. He  does have leg cramps especially after exertion. It is slow to recover. He feels this may be related to medication, however I am not entirely confident this is related to Lipitor. Nevertheless, we discussed various options including continuing on Lipitor vs another medication. He wished to try another medication for now. I will prescribe and Zocor 40 mg daily and discontinue Lipitor for now. He will need a fasting lipid panel and LFTs in 2-3 months. Otherwise he denies any significant orthopnea or PND episodes. He can follow-up in 12 months.    Past Medical History:  Diagnosis Date  . Arthritis   . BPH (benign prostatic hypertrophy)   . CAD (coronary artery disease) 2010   Blockage in artery/ widow maker  . CHF (congestive heart failure) ([place])   . Depression   . Diarrhea   . Hyperlipidemia   . Hypertension   . Obesity   . OSA on CPAP   . Pleurisy   . Sleep apnea     Past Surgical History:  Procedure Laterality Date  . LEFT AND RIGHT HEART CATHETERIZATION WITH CORONARY ANGIOGRAM N/A 03/05/2011   Procedure: LEFT AND RIGHT HEART CATHETERIZATION WITH CORONARY ANGIOGRAM;  Surgeon: Larey Dresser, MD;  Location: [facility] CATH LAB;  Service: Cardiovascular;  Laterality: N/A;  . [place]   right with drainage utilizing VAT for empyema  . TONSILLECTOMY      Current Medications: Outpatient Medications Prior to Visit  Medication Sig Dispense Refill  . aspirin 81 MG tablet Take 81 mg by mouth daily.      Marland Kitchen  Cholecalciferol (VITAMIN D3) 2000 units TABS Take 2,000 Units by mouth daily.    . Ibuprofen (ADVIL PO) Take by mouth as needed (pain).    . Omega-3 Fatty Acids (FISH OIL) 1000 MG CAPS Take 1,000 mg by mouth daily.     . pantoprazole (PROTONIX) 40 MG tablet Take 1 tablet (40 mg total) by mouth daily. 30 tablet 6  . Saw Palmetto, Serenoa repens, (SAW PALMETTO PO) Take 450 mg by mouth 2 (two) times daily.    Marland Kitchen zolpidem (AMBIEN) 10 MG tablet Take 1 tablet (10 mg total) by mouth at  bedtime as needed. for sleep 30 tablet 2  . atorvastatin (LIPITOR) 80 MG tablet TAKE 1 TABLET BY MOUTH EVERY DAY - PLEASE SCHEDULE AN APPOINTMENT FOR MORE REFILLS 30 tablet 6  . losartan (COZAAR) 50 MG tablet Take 1 tablet (50 mg total) by mouth daily. DUE FOR FOLLOW UP. PLEASE CALL AND SCHEDULE [phone] 30 tablet 0  . phentermine (ADIPEX-P) 37.5 MG tablet TAKE 1 TAB BY MOUTH A DAY BEFORE BREAKFAST 30 tablet 0   Facility-Administered Medications Prior to Visit  Medication Dose Route Frequency Provider Last Rate Last Dose  . 0.9 %  sodium chloride infusion  500 mL Intravenous Continuous Nandigam, Kavitha V, MD      . 0.9 %  sodium chloride infusion  500 mL Intravenous Continuous Nandigam, Venia Minks, MD         Allergies:   Patient has no known allergies.   Social History   Social History  . Marital status: Married    Spouse name: N/A  . Number of children: 1  . Years of education: N/A   Occupational History  . Retired    Social History Main Topics  . Smoking status: Never Smoker  . Smokeless tobacco: Never Used     Comment: cigars  . Alcohol use No  . Drug use: No  . Sexual activity: Not Asked   Other Topics Concern  . None   Social History Narrative  . None     Family History:  The patient's family history includes Aneurysm in his mother; Cancer (age of onset: 10) in his father; Colon cancer in his maternal grandmother; Heart disease in his mother; Hyperlipidemia in his sister; Hypertension in his mother and sister; Ulcers in his mother.   ROS:   Please see the history of present illness.    ROS All other systems reviewed and are negative.   PHYSICAL EXAM:   VS:  BP 124/78   Pulse 71   Ht 6\' 3"  (1.905 m)   Wt 262 lb 6.4 oz (119 kg)   BMI 32.80 kg/m    GEN: Well nourished, well developed, in no acute distress  HEENT: normal  Neck: no JVD, carotid bruits, or masses Cardiac: RRR; no murmurs, rubs, or gallops,no edema  Respiratory:  clear to auscultation  bilaterally, normal work of breathing GI: soft, nontender, nondistended, + BS MS: no deformity or atrophy  Skin: warm and dry, no rash Neuro:  Alert and Oriented x 3, Strength and sensation are intact Psych: euthymic mood, full affect  Wt Readings from Last 3 Encounters:  10/23/16 262 lb 6.4 oz (119 kg)  09/03/16 259 lb (117.5 kg)  07/29/16 270 lb (122.5 kg)      Studies/Labs Reviewed:   EKG:  EKG is ordered today.  The ekg ordered today demonstrates Normal sinus rhythm without significant ST-T wave changes.  Recent Labs: 09/03/2016: ALT 27; BUN 20; Creat 1.28; Potassium  4.0; Sodium 138   Lipid Panel    Component Value Date/Time   CHOL 112 09/03/2016 0953   TRIG 164 (H) 09/03/2016 0953   HDL 36 (L) 09/03/2016 0953   CHOLHDL 3.1 09/03/2016 0953   VLDL 33 (H) 09/03/2016 0953   LDLCALC 43 09/03/2016 0953   LDLDIRECT 89.4 02/13/2009 0822    Additional studies/ records that were reviewed today include:   Cath 01/23/2009    ASSESSMENT:    1. Coronary artery disease involving native coronary artery of native heart without angina pectoris   2. Essential hypertension, benign   3. Pure hypercholesterolemia   4. OSA on CPAP   5. DOE (dyspnea on exertion)      PLAN:  In order of problems listed above:  1. CAD: Residual disease in left main and proximal LAD on previous cardiac catheterization. He has chronic dyspnea on exertion that is not related to coronary artery disease. This has not changed in the recent year.  2. Hypertension: Blood pressure well controlled  3. Hyperlipidemia: He has been having some muscle pain, it eventually goes away after resting for a few days. He think it is related to Lipitor, I am not confident this is related to statin medication. He wished to try a different medication, I have stopped Lipitor and the switched him to Zocor 40 mg daily. He will need a fasting lipid panel in 2-3 months.    Medication Adjustments/Labs and Tests  Ordered: Current medicines are reviewed at length with the patient today.  Concerns regarding medicines are outlined above.  Medication changes, Labs and Tests ordered today are listed in the Patient Instructions below. Patient Instructions  Medication Instructions: Your physician recommends that you continue on your current medications as directed. Please refer to the Current Medication list given to you today. STOP Atorvastatin  START Simvastatin 40 mg daily.  Labwork: Your physician recommends that you return for a FASTING lipid profile and hepatic function panel in 2-3 months.   Follow-Up: Your physician wants you to follow-up in: 1 year with Dr. Stanford Breed. You will receive a reminder letter in the mail two months in advance. If you don't receive a letter, please call our office to schedule the follow-up appointment.  If you need a refill on your cardiac medications before your next appointment, please call your pharmacy.     Hilbert Corrigan, [place]  10/24/2016 12:38 AM    [address], [place], [place]  [address] Phone: [phone]; Fax: [phone]

## 2016-10-23 NOTE — Patient Instructions (Addendum)
Medication Instructions: Your physician recommends that you continue on your current medications as directed. Please refer to the Current Medication list given to you today. STOP Atorvastatin  START Simvastatin 40 mg daily.  Labwork: Your physician recommends that you return for a FASTING lipid profile and hepatic function panel in 2-3 months.   Follow-Up: Your physician wants you to follow-up in: 1 year with Dr. Stanford Breed. You will receive a reminder letter in the mail two months in advance. If you don't receive a letter, please call our office to schedule the follow-up appointment.  If you need a refill on your cardiac medications before your next appointment, please call your pharmacy.

## 2016-10-24 ENCOUNTER — Encounter: Payer: Self-pay | Admitting: Physician Assistant

## 2016-11-27 ENCOUNTER — Ambulatory Visit (INDEPENDENT_AMBULATORY_CARE_PROVIDER_SITE_OTHER): Payer: Medicare Other | Admitting: Family Medicine

## 2016-11-27 VITALS — BP 122/52 | HR 76 | Temp 98.4°F | Ht 75.0 in | Wt 267.5 lb

## 2016-11-27 DIAGNOSIS — Z23 Encounter for immunization: Secondary | ICD-10-CM

## 2016-11-27 NOTE — Progress Notes (Signed)
   Subjective:    Patient ID: Jeremy Woodward, male    DOB: 1945/11/19, 71 y.o.   MRN: [id number]  HPI Pt is here for flu vaccine. Pt denies egg allergy, fever, chest pain, SOB, or any other problems.    Review of Systems     Objective:   Physical Exam        Assessment & Plan:  Pt tolerated injection in Right deltoid well and without complications.

## 2017-02-20 DIAGNOSIS — E7800 Pure hypercholesterolemia, unspecified: Secondary | ICD-10-CM | POA: Diagnosis not present

## 2017-02-21 LAB — HEPATIC FUNCTION PANEL
ALBUMIN: 4.7 g/dL (ref 3.5–4.8)
ALT: 31 IU/L (ref 0–44)
AST: 27 IU/L (ref 0–40)
Alkaline Phosphatase: 55 IU/L (ref 39–117)
BILIRUBIN TOTAL: 0.5 mg/dL (ref 0.0–1.2)
BILIRUBIN, DIRECT: 0.15 mg/dL (ref 0.00–0.40)
TOTAL PROTEIN: 6.9 g/dL (ref 6.0–8.5)

## 2017-02-21 LAB — LIPID PANEL
CHOL/HDL RATIO: 4 ratio (ref 0.0–5.0)
Cholesterol, Total: 162 mg/dL (ref 100–199)
HDL: 41 mg/dL (ref >39)
LDL Calculated: 80 mg/dL (ref 0–99)
Triglycerides: 203 mg/dL — ABNORMAL HIGH (ref 0–149)
VLDL Cholesterol Cal: 41 mg/dL — ABNORMAL HIGH (ref 5–40)

## 2017-02-24 ENCOUNTER — Telehealth: Payer: Self-pay

## 2017-02-24 DIAGNOSIS — I2510 Atherosclerotic heart disease of native coronary artery without angina pectoris: Secondary | ICD-10-CM

## 2017-02-24 MED ORDER — ROSUVASTATIN CALCIUM 40 MG PO TABS: 40.0000 mg | ORAL_TABLET | Freq: Every day | ORAL | 3 refills | Status: DC

## 2017-02-24 NOTE — Telephone Encounter (Signed)
Spoke with patient, informed him about his labs. Simvastatin will be discontinued and Crestor will be started per [facility]. Labs ordered. Patient voiced understanding,  Patient stated he is not having leg cramps.

## 2017-04-05 ENCOUNTER — Other Ambulatory Visit: Payer: Self-pay | Admitting: Gastroenterology

## 2017-04-16 ENCOUNTER — Other Ambulatory Visit: Payer: Self-pay | Admitting: Physician Assistant

## 2017-04-16 NOTE — Telephone Encounter (Signed)
Please review for refill, Thanks !  

## 2017-05-06 ENCOUNTER — Encounter: Payer: Medicare Other | Admitting: Family Medicine

## 2017-05-27 ENCOUNTER — Encounter: Payer: Self-pay | Admitting: Physician Assistant

## 2017-06-02 ENCOUNTER — Telehealth: Payer: Self-pay | Admitting: *Deleted

## 2017-06-02 DIAGNOSIS — E7800 Pure hypercholesterolemia, unspecified: Secondary | ICD-10-CM

## 2017-06-02 DIAGNOSIS — I2510 Atherosclerotic heart disease of native coronary artery without angina pectoris: Secondary | ICD-10-CM

## 2017-06-02 NOTE — Telephone Encounter (Signed)
Fasting Lipid and Hepatic labs have been reordered to [facility] per the patient request.

## 2017-06-03 DIAGNOSIS — I2510 Atherosclerotic heart disease of native coronary artery without angina pectoris: Secondary | ICD-10-CM | POA: Diagnosis not present

## 2017-06-03 DIAGNOSIS — E7800 Pure hypercholesterolemia, unspecified: Secondary | ICD-10-CM | POA: Diagnosis not present

## 2017-06-04 LAB — LIPID PANEL
CHOLESTEROL: 120 mg/dL (ref <200)
HDL: 42 mg/dL (ref >40)
LDL Cholesterol (Calc): 53 mg/dL (calc)
Non-HDL Cholesterol (Calc): 78 mg/dL (calc) (ref <130)
Total CHOL/HDL Ratio: 2.9 (calc) (ref <5.0)
Triglycerides: 171 mg/dL — ABNORMAL HIGH (ref <150)

## 2017-06-04 LAB — HEPATIC FUNCTION PANEL
AG Ratio: 1.9 (calc) (ref 1.0–2.5)
ALT: 27 U/L (ref 9–46)
AST: 24 U/L (ref 10–35)
Albumin: 4.2 g/dL (ref 3.6–5.1)
Alkaline phosphatase (APISO): 49 U/L (ref 40–115)
BILIRUBIN TOTAL: 0.6 mg/dL (ref 0.2–1.2)
Bilirubin, Direct: 0.1 mg/dL (ref 0.0–0.2)
Globulin: 2.2 g/dL (calc) (ref 1.9–3.7)
Indirect Bilirubin: 0.5 mg/dL (calc) (ref 0.2–1.2)
Total Protein: 6.4 g/dL (ref 6.1–8.1)

## 2017-06-18 ENCOUNTER — Encounter: Payer: Medicare Other | Admitting: Family Medicine

## 2017-06-27 ENCOUNTER — Other Ambulatory Visit: Payer: Self-pay | Admitting: Physician Assistant

## 2017-06-27 NOTE — Telephone Encounter (Signed)
Rx sent to pharmacy   

## 2017-07-01 DIAGNOSIS — M25561 Pain in right knee: Secondary | ICD-10-CM | POA: Diagnosis not present

## 2017-07-01 DIAGNOSIS — M2341 Loose body in knee, right knee: Secondary | ICD-10-CM | POA: Diagnosis not present

## 2017-07-01 DIAGNOSIS — M1711 Unilateral primary osteoarthritis, right knee: Secondary | ICD-10-CM | POA: Insufficient documentation

## 2017-07-01 DIAGNOSIS — M25562 Pain in left knee: Secondary | ICD-10-CM | POA: Insufficient documentation

## 2017-07-01 DIAGNOSIS — M1712 Unilateral primary osteoarthritis, left knee: Secondary | ICD-10-CM | POA: Insufficient documentation

## 2017-07-07 ENCOUNTER — Encounter: Payer: Self-pay | Admitting: Family Medicine

## 2017-07-07 ENCOUNTER — Ambulatory Visit (INDEPENDENT_AMBULATORY_CARE_PROVIDER_SITE_OTHER): Payer: Medicare Other | Admitting: Family Medicine

## 2017-07-07 VITALS — BP 132/64 | HR 76 | Ht 75.0 in | Wt 283.0 lb

## 2017-07-07 DIAGNOSIS — Z125 Encounter for screening for malignant neoplasm of prostate: Secondary | ICD-10-CM | POA: Diagnosis not present

## 2017-07-07 DIAGNOSIS — I10 Essential (primary) hypertension: Secondary | ICD-10-CM | POA: Diagnosis not present

## 2017-07-07 DIAGNOSIS — I2510 Atherosclerotic heart disease of native coronary artery without angina pectoris: Secondary | ICD-10-CM

## 2017-07-07 DIAGNOSIS — Z0000 Encounter for general adult medical examination without abnormal findings: Secondary | ICD-10-CM | POA: Diagnosis not present

## 2017-07-07 DIAGNOSIS — Z6835 Body mass index (BMI) 35.0-35.9, adult: Secondary | ICD-10-CM | POA: Diagnosis not present

## 2017-07-07 DIAGNOSIS — R21 Rash and other nonspecific skin eruption: Secondary | ICD-10-CM | POA: Diagnosis not present

## 2017-07-07 DIAGNOSIS — M2341 Loose body in knee, right knee: Secondary | ICD-10-CM | POA: Diagnosis not present

## 2017-07-07 LAB — BASIC METABOLIC PANEL
BUN/Creatinine Ratio: 25 (calc) — ABNORMAL HIGH (ref 6–22)
BUN: 27 mg/dL — AB (ref 7–25)
CALCIUM: 9.3 mg/dL (ref 8.6–10.3)
CO2: 24 mmol/L (ref 20–32)
CREATININE: 1.07 mg/dL (ref 0.70–1.18)
Chloride: 106 mmol/L (ref 98–110)
GLUCOSE: 108 mg/dL — AB (ref 65–99)
POTASSIUM: 4.4 mmol/L (ref 3.5–5.3)
SODIUM: 138 mmol/L (ref 135–146)

## 2017-07-07 LAB — PSA: PSA: 1.5 ng/mL (ref <=4.0)

## 2017-07-07 MED ORDER — TOPIRAMATE 25 MG PO TABS: 25.0000 mg | ORAL_TABLET | Freq: Two times a day (BID) | ORAL | 0 refills | Status: DC

## 2017-07-07 MED ORDER — ZOLPIDEM TARTRATE 10 MG PO TABS: 10.0000 mg | ORAL_TABLET | Freq: Every evening | ORAL | 4 refills | Status: DC | PRN

## 2017-07-07 MED ORDER — CLOTRIMAZOLE-BETAMETHASONE 1-0.05 % EX CREA: 1.0000 "application " | TOPICAL_CREAM | Freq: Two times a day (BID) | CUTANEOUS | 0 refills | Status: DC

## 2017-07-07 NOTE — Patient Instructions (Addendum)
Cream for her feet does not help after 2 weeks and please give me a call. Consider getting her shingles vaccine at the pharmacy.   Health Maintenance, Male A healthy lifestyle and preventive care is important for your health and wellness. Ask your health care provider about what schedule of regular examinations is right for you. What should I know about weight and diet? Eat a Healthy Diet  Eat plenty of vegetables, fruits, whole grains, low-fat dairy products, and lean protein.  Do not eat a lot of foods high in solid fats, added sugars, or salt.  Maintain a Healthy Weight Regular exercise can help you achieve or maintain a healthy weight. You should:  Do at least 150 minutes of exercise each week. The exercise should increase your heart rate and make you sweat (moderate-intensity exercise).  Do strength-training exercises at least twice a week.  Watch Your Levels of Cholesterol and Blood Lipids  Have your blood tested for lipids and cholesterol every 5 years starting at 73 years of age. If you are at high risk for heart disease, you should start having your blood tested when you are 73 years old. You may need to have your cholesterol levels checked more often if: ? Your lipid or cholesterol levels are high. ? You are older than 73 years of age. ? You are at high risk for heart disease.  What should I know about cancer screening? Many types of cancers can be detected early and may often be prevented. Lung Cancer  You should be screened every year for lung cancer if: ? You are a current smoker who has smoked for at least 30 years. ? You are a former smoker who has quit within the past 15 years.  Talk to your health care provider about your screening options, when you should start screening, and how often you should be screened.  Colorectal Cancer  Routine colorectal cancer screening usually begins at 73 years of age and should be repeated every 5-10 years until you are 73 years  old. You may need to be screened more often if early forms of precancerous polyps or small growths are found. Your health care provider may recommend screening at an earlier age if you have risk factors for colon cancer.  Your health care provider may recommend using home test kits to check for hidden blood in the stool.  A small camera at the end of a tube can be used to examine your colon (sigmoidoscopy or colonoscopy). This checks for the earliest forms of colorectal cancer.  Prostate and Testicular Cancer  Depending on your age and overall health, your health care provider may do certain tests to screen for prostate and testicular cancer.  Talk to your health care provider about any symptoms or concerns you have about testicular or prostate cancer.  Skin Cancer  Check your skin from head to toe regularly.  Tell your health care provider about any new moles or changes in moles, especially if: ? There is a change in a mole's size, shape, or color. ? You have a mole that is larger than a pencil eraser.  Always use sunscreen. Apply sunscreen liberally and repeat throughout the day.  Protect yourself by wearing long sleeves, pants, a wide-brimmed hat, and sunglasses when outside.  What should I know about heart disease, diabetes, and high blood pressure?  If you are 37-17 years of age, have your blood pressure checked every 3-5 years. If you are 18 years of age or older,  have your blood pressure checked every year. You should have your blood pressure measured twice-once when you are at a hospital or clinic, and once when you are not at a hospital or clinic. Record the average of the two measurements. To check your blood pressure when you are not at a hospital or clinic, you can use: ? An automated blood pressure machine at a pharmacy. ? A home blood pressure monitor.  Talk to your health care provider about your target blood pressure.  If you are between 48-48 years old, ask your  health care provider if you should take aspirin to prevent heart disease.  Have regular diabetes screenings by checking your fasting blood sugar level. ? If you are at a normal weight and have a low risk for diabetes, have this test once every three years after the age of 14. ? If you are overweight and have a high risk for diabetes, consider being tested at a younger age or more often.  A one-time screening for abdominal aortic aneurysm (AAA) by ultrasound is recommended for men aged 13-75 years who are current or former smokers. What should I know about preventing infection? Hepatitis B If you have a higher risk for hepatitis B, you should be screened for this virus. Talk with your health care provider to find out if you are at risk for hepatitis B infection. Hepatitis C Blood testing is recommended for:  Everyone born from [birth year] through 1966.  Anyone with known risk factors for hepatitis C.  Sexually Transmitted Diseases (STDs)  You should be screened each year for STDs including gonorrhea and chlamydia if: ? You are sexually active and are younger than 73 years of age. ? You are older than 73 years of age and your health care provider tells you that you are at risk for this type of infection. ? Your sexual activity has changed since you were last screened and you are at an increased risk for chlamydia or gonorrhea. Ask your health care provider if you are at risk.  Talk with your health care provider about whether you are at high risk of being infected with HIV. Your health care provider may recommend a prescription medicine to help prevent HIV infection.  What else can I do?  Schedule regular health, dental, and eye exams.  Stay current with your vaccines (immunizations).  Do not use any tobacco products, such as cigarettes, chewing tobacco, and e-cigarettes. If you need help quitting, ask your health care provider.  Limit alcohol intake to no more than 2 drinks per day. One  drink equals 12 ounces of beer, 5 ounces of wine, or 1 ounces of hard liquor.  Do not use street drugs.  Do not share needles.  Ask your health care provider for help if you need support or information about quitting drugs.  Tell your health care provider if you often feel depressed.  Tell your health care provider if you have ever been abused or do not feel safe at home. This information is not intended to replace advice given to you by your health care provider. Make sure you discuss any questions you have with your health care provider. Document Released: 07/13/2007 Document Revised: 09/13/2015 Document Reviewed: 10/18/2014 Elsevier Interactive Patient Education  [employer].

## 2017-07-07 NOTE — Progress Notes (Signed)
do  Subjective:   Jeremy Woodward is a 72 y.o. male who presents for Medicare Annual/Subsequent preventive examination.  Does complain of a rash on both feet.  He says they were initially red but a little less so now but just itchy scaly and irritated.  He says if he does not moisturize them twice a day they will crack and burn.  He is mostly been using over-the-counter moisturizers.  Really like he would also really like to get back on medication for weight loss.  He is used a stimulant in the past for a few months at a time and it does seem to really help curb his appetite.  Dealing with knee problems and in fact is scheduled for an MRI today as it has not really been able to be active or exercise and he is actually gained about 16 pounds since October.  Review of Systems:  Comprehensive ROS is negative except for HPI.         Objective:    Vitals: BP 132/64   Pulse 76   Ht 6\' 3"  (1.905 m)   Wt 283 lb (128.4 kg)   SpO2 100%   BMI 35.37 kg/m   Body mass index is 35.37 kg/m.  Advanced Directives 07/07/2017 08/01/2013 06/29/2013  Does Patient Have a Medical Advance Directive? No Patient would not like information Patient does not have advance directive;Patient would not like information  Would patient like information on creating a medical advance directive? No - Patient declined - -    Tobacco Social History   Tobacco Use  Smoking Status Never Smoker  Smokeless Tobacco Never Used  Tobacco Comment   cigars     Counseling given: Not Answered Comment: cigars   Clinical Intake:       Physical Exam  Constitutional: He is oriented to person, place, and time. He appears well-developed and well-nourished.  HENT:  Head: Normocephalic and atraumatic.  Right Ear: External ear normal.  Left Ear: External ear normal.  Nose: Nose normal.  Mouth/Throat: Oropharynx is clear and moist.  Eyes: Pupils are equal, round, and reactive to light. Conjunctivae and EOM are normal.  Neck:  Normal range of motion. Neck supple. No thyromegaly present.  Cardiovascular: Normal rate, regular rhythm, normal heart sounds and intact distal pulses.  Pulmonary/Chest: Effort normal and breath sounds normal.  Abdominal: He exhibits no distension and no mass. There is no tenderness. There is no rebound and no guarding.  Musculoskeletal: Normal range of motion.  Lymphadenopathy:    He has no cervical adenopathy.  Neurological: He is alert and oriented to person, place, and time. He has normal reflexes.  Skin: Skin is warm and dry.  Dry thick scaling peeling rash on his feet.  Mostly on the edges and a little bit under the arch.  Some dry scaling skin between the toes as well but no significant erythema or maceration.  Psychiatric: He has a normal mood and affect. His behavior is normal. Judgment and thought content normal.                  Past Medical History:  Diagnosis Date  . Arthritis   . BPH (benign prostatic hypertrophy)   . CAD (coronary artery disease) 2010   Blockage in artery/ widow maker  . CHF (congestive heart failure) ([place])   . Depression   . Diarrhea   . Hyperlipidemia   . Hypertension   . Obesity   . OSA on CPAP   . Pleurisy   .  Sleep apnea    Past Surgical History:  Procedure Laterality Date  . LEFT AND RIGHT HEART CATHETERIZATION WITH CORONARY ANGIOGRAM N/A 03/05/2011   Procedure: LEFT AND RIGHT HEART CATHETERIZATION WITH CORONARY ANGIOGRAM;  Surgeon: Larey Dresser, MD;  Location: [facility] CATH LAB;  Service: Cardiovascular;  Laterality: N/A;  . [place]   right with drainage utilizing VAT for empyema  . TONSILLECTOMY     Family History  Problem Relation Age of Onset  . Cancer Father 22       liver and lung  . Heart disease Mother   . Ulcers Mother   . Hypertension Mother   . Aneurysm Mother   . Hyperlipidemia Sister   . Hypertension Sister   . Colon cancer Maternal Grandmother        late 45's when dx   . Esophageal cancer Neg  Hx   . Stomach cancer Neg Hx   . Rectal cancer Neg Hx    Social History   Socioeconomic History  . Marital status: Married    Spouse name: Not on file  . Number of children: 1  . Years of education: Not on file  . Highest education level: Not on file  Occupational History  . Occupation: Retired  Scientific laboratory technician  . Financial resource strain: Not on file  . Food insecurity:    Worry: Not on file    Inability: Not on file  . Transportation needs:    Medical: Not on file    Non-medical: Not on file  Tobacco Use  . Smoking status: Never Smoker  . Smokeless tobacco: Never Used  . Tobacco comment: cigars  Substance and Sexual Activity  . Alcohol use: No  . Drug use: No  . Sexual activity: Not on file  Lifestyle  . Physical activity:    Days per week: Not on file    Minutes per session: Not on file  . Stress: Not on file  Relationships  . Social connections:    Talks on phone: Not on file    Gets together: Not on file    Attends religious service: Not on file    Active member of club or organization: Not on file    Attends meetings of clubs or organizations: Not on file    Relationship status: Not on file  Other Topics Concern  . Not on file  Social History Narrative  . Not on file    Outpatient Encounter Medications as of 07/07/2017  Medication Sig  . aspirin 81 MG tablet Take 81 mg by mouth daily.    . Cholecalciferol (VITAMIN D3) 2000 units TABS Take 2,000 Units by mouth daily.  . diphenoxylate-atropine (LOMOTIL) 2.5-0.025 MG tablet Take 1 tablet by mouth 4 (four) times daily as needed for diarrhea or loose stools.  . Ibuprofen (ADVIL PO) Take by mouth as needed (pain).  Marland Kitchen losartan (COZAAR) 50 MG tablet Take 1 tablet (50 mg total) by mouth daily.  . Omega-3 Fatty Acids (FISH OIL) 1000 MG CAPS Take 1,000 mg by mouth daily.   . pantoprazole (PROTONIX) 40 MG tablet TAKE 1 TABLET (40 MG TOTAL) BY MOUTH DAILY.  . rosuvastatin (CRESTOR) 40 MG tablet TAKE ONE TABLET BY MOUTH  DAILY  . Saw Palmetto, Serenoa repens, (SAW PALMETTO PO) Take 450 mg by mouth 2 (two) times daily.  Marland Kitchen zolpidem (AMBIEN) 10 MG tablet Take 1 tablet (10 mg total) by mouth at bedtime as needed. for sleep  . [DISCONTINUED] zolpidem (AMBIEN) 10  MG tablet Take 1 tablet (10 mg total) by mouth at bedtime as needed. for sleep  . [DISCONTINUED] simvastatin (ZOCOR) 40 MG tablet TAKE 1 TABLET BY MOUTH EVERYDAY AT BEDTIME  . [DISCONTINUED] 0.9 %  sodium chloride infusion   . [DISCONTINUED] 0.9 %  sodium chloride infusion    No facility-administered encounter medications on file as of 07/07/2017.     Activities of Daily Living In your present state of health, do you have any difficulty performing the following activities: 07/07/2017  Hearing? Y  Vision? Y  Difficulty concentrating or making decisions? N  Walking or climbing stairs? Y  Dressing or bathing? N  Doing errands, shopping? N  Some recent data might be hidden    Patient Care Team: [name], MD as PCP - General (Family Medicine) Stanford Breed Denice Bors, MD as Consulting Physician (Cardiology) Amil Amen, MD as Referring Physician (Psychiatry)   Assessment:   This is a routine wellness examination for [place].  Exercise Activities and Dietary recommendations Current Exercise Habits: The patient does not participate in regular exercise at present, Exercise limited by: orthopedic condition(s)  Goals    None      Fall Risk Fall Risk  07/07/2017 01/15/2016  Falls in the past year? Yes No  Number falls in past yr: 1 -  Injury with Fall? No -  Risk for fall due to : Impaired mobility -  Follow up Falls prevention discussed -    Depression Screen PHQ 2/9 Scores 07/07/2017 01/15/2016  PHQ - 2 Score 0 0    Cognitive Function     6CIT Screen 07/07/2017 01/15/2016  What Year? 0 points 0 points  What month? 0 points 0 points  What time? 0 points 0 points  Count back from 20 0 points 0 points  Months in reverse 0 points  0 points  Repeat phrase 2 points 0 points  Total Score 2 0    Immunization History  Administered Date(s) Administered  . Influenza Split 10/11/2010, 11/06/2011  . Influenza Whole 12/01/2008, 12/11/2009, 09/30/2014  . Influenza, High Dose Seasonal PF 11/27/2016  . Influenza,inj,Quad PF,6+ Mos 11/19/2013, 09/28/2015  . Influenza-Unspecified 09/29/2014  . Pneumococcal Conjugate-13 11/19/2013  . Pneumococcal Polysaccharide-23 11/14/2011  . Td 12/01/2008  . Tdap 10/11/2010  . Zoster 10/11/2010    Qualifies for Shingles Vaccine? Yes  Screening Tests Health Maintenance  Topic Date Due  . INFLUENZA VACCINE  08/28/2017  . TETANUS/TDAP  10/10/2020  . COLONOSCOPY  02/12/2026  . Hepatitis C Screening  Completed  . PNA vac Low Risk Adult  Completed   Cancer Screenings: Lung: Low Dose CT Chest recommended if Age 79-80 years, 30 pack-year currently smoking OR have quit w/in 15years. Patient does not qualify. Colorectal: UP to date, 2018  Additional Screenings:  Hepatitis C Screening:      Plan:   Medicare Wellness Exam   I have personally reviewed and noted the following in the patient's chart:   . Medical and social history . Use of alcohol, tobacco or illicit drugs  . Current medications and supplements . Functional ability and status . Nutritional status . Physical activity . Advanced directives . List of other physicians . Hospitalizations, surgeries, and ER visits in previous 12 months . Vitals . Screenings to include cognitive, depression, and falls . Referrals and appointments . Rash of both feet-at most looks like a dyshidrotic eczema possibly complicated by tinea pedis.  We will treat with Lotrisone for the next 2 weeks and have him apply  his lotion on top.  If he is not improving then please let me know. . To schedule his shingles vaccine at the pharmacy once it is available. . Abnormal weight gain/BMI 35-discussed options.  Being that he is now 89 I really do not  feel comfortable putting him on a stimulant.  Certainly we can always get it approved through his cardiologist but we did discuss some alternative options such as metformin, Wellbutrin or topiramate.  In fact he was actually on topiramate a couple years ago for chronic headaches and says he actually did well on it and actually lost weight on it.  He is willing to retry that today.  We will start with a low-dose of 25 mg and then we can adjust dose if needed.  In addition, I have reviewed and discussed with patient certain preventive protocols, quality metrics, and best practice recommendations. A written personalized care plan for preventive services as well as general preventive health recommendations were provided to patient.     Beatrice Lecher, MD  07/07/2017

## 2017-07-08 NOTE — Progress Notes (Signed)
All labs are normal. 

## 2017-07-18 DIAGNOSIS — M25561 Pain in right knee: Secondary | ICD-10-CM | POA: Diagnosis not present

## 2017-07-18 DIAGNOSIS — M1711 Unilateral primary osteoarthritis, right knee: Secondary | ICD-10-CM | POA: Diagnosis not present

## 2017-07-22 ENCOUNTER — Telehealth: Payer: Self-pay

## 2017-07-22 NOTE — Telephone Encounter (Signed)
   [facility] Pre-operative Risk Assessment    Request for surgical clearance  1. What type of surgery is being performed? RIGHT KNEE SCOPE  2. When is this surgery scheduled? 09-01-17   3. What type of clearance is required (medical clearance vs. Pharmacy clearance to hold med vs. Both)? MEDICAL  4. Are there any medications that need to be held prior to surgery and how long?NOT LISTED    5. Practice name and name of physician performing surgery?   [facility]  6. What is your office phone number [phone]    7.   What is your office fax number  [phone]  8.   Anesthesia type (None, local, MAC, general) ? CHOICE  Waylan Rocher 07/22/2017, 2:53 PM  _________________________________________________________________   (provider comments below)

## 2017-07-23 NOTE — Telephone Encounter (Signed)
   Primary Cardiologist: Kirk Ruths, MD  Chart reviewed as part of pre-operative protocol coverage. Patient was contacted 07/23/2017 in reference to pre-operative risk assessment for pending surgery as outlined below.  JONNATHAN BIRMAN was last seen on 09/2016 by Almyra Deforest PA-C. H/o CAD (nonobstructive by cath 2013), HTN, HLD, OSA, obesity, empyema/VATS. PMH also includes CHF but not really detailed in cardiology notes. RCRI 0.4% indicating low risk of cardiac complications. Since that day, SHAMEER MOLSTAD has done well without any new anginal symptoms. He is able to complete > 4 METS without chest pain, dyspnea, palpitations or syncope. Therefore, as long as he remains stable before August, based on ACC/AHA guidelines, the patient would be at acceptable risk for the planned procedure without further cardiovascular testing.   I will route this recommendation to the requesting party via Epic fax function and remove from pre-op pool.  Please call with questions.  Charlie Pitter, PA-C 07/23/2017, 3:42 PM

## 2017-07-29 ENCOUNTER — Other Ambulatory Visit: Payer: Self-pay | Admitting: Family Medicine

## 2017-07-30 ENCOUNTER — Other Ambulatory Visit: Payer: Self-pay | Admitting: *Deleted

## 2017-07-30 MED ORDER — TOPIRAMATE 50 MG PO TABS: 50.0000 mg | ORAL_TABLET | Freq: Every day | ORAL | 1 refills | Status: DC

## 2017-08-05 ENCOUNTER — Ambulatory Visit (INDEPENDENT_AMBULATORY_CARE_PROVIDER_SITE_OTHER): Payer: Medicare Other | Admitting: Physician Assistant

## 2017-08-05 VITALS — BP 130/53 | HR 67 | Temp 98.0°F | Wt 278.0 lb

## 2017-08-05 DIAGNOSIS — R7303 Prediabetes: Secondary | ICD-10-CM

## 2017-08-05 DIAGNOSIS — R7309 Other abnormal glucose: Secondary | ICD-10-CM

## 2017-08-05 DIAGNOSIS — Z01818 Encounter for other preprocedural examination: Secondary | ICD-10-CM

## 2017-08-05 DIAGNOSIS — I2510 Atherosclerotic heart disease of native coronary artery without angina pectoris: Secondary | ICD-10-CM

## 2017-08-05 LAB — POCT GLYCOSYLATED HEMOGLOBIN (HGB A1C): Hemoglobin A1C: 5.7 % — AB (ref 4.0–5.6)

## 2017-08-05 LAB — POCT URINALYSIS DIPSTICK
BILIRUBIN UA: NEGATIVE
Blood, UA: NEGATIVE
Glucose, UA: NEGATIVE
KETONES UA: NEGATIVE
Leukocytes, UA: NEGATIVE
Nitrite, UA: NEGATIVE
Protein, UA: NEGATIVE
Spec Grav, UA: 1.025 (ref 1.010–1.025)
Urobilinogen, UA: 0.2 E.U./dL
pH, UA: 5.5 (ref 5.0–8.0)

## 2017-08-05 LAB — POCT UA - MICROALBUMIN
CREATININE, POC: 300 mg/dL
MICROALBUMIN (UR) POC: 30 mg/L

## 2017-08-05 LAB — CBC
HCT: 43.8 % (ref 38.5–50.0)
Hemoglobin: 15.1 g/dL (ref 13.2–17.1)
MCH: 30.3 pg (ref 27.0–33.0)
MCHC: 34.5 g/dL (ref 32.0–36.0)
MCV: 87.8 fL (ref 80.0–100.0)
MPV: 9.6 fL (ref 7.5–12.5)
PLATELETS: 235 10*3/uL (ref 140–400)
RBC: 4.99 10*6/uL (ref 4.20–5.80)
RDW: 12.7 % (ref 11.0–15.0)
WBC: 7.1 10*3/uL (ref 3.8–10.8)

## 2017-08-05 NOTE — Progress Notes (Signed)
   Subjective:    Patient ID: ZIMRI BRENNEN, male    DOB: 1946/01/18, 72 y.o.   MRN: [id number]  HPI Patient in office today for pre-op clearance. Patient has no complaints of chest pain, SOB, palpitations. KG LPN   Review of Systems     Objective:   Physical Exam        Assessment & Plan:  EKG, HgA1c, UA and microalbumin ran for pre -op. Iran Planas reviewed all documents. KG LPN  .Marland Kitchen Results for orders placed or performed in visit on 08/05/17  CBC  Result Value Ref Range   WBC 7.1 3.8 - 10.8 Thousand/uL   RBC 4.99 4.20 - 5.80 Million/uL   Hemoglobin 15.1 13.2 - 17.1 g/dL   HCT 43.8 38.5 - 50.0 %   MCV 87.8 80.0 - 100.0 fL   MCH 30.3 27.0 - 33.0 pg   MCHC 34.5 32.0 - 36.0 g/dL   RDW 12.7 11.0 - 15.0 %   Platelets 235 140 - 400 Thousand/uL   MPV 9.6 7.5 - 12.5 fL  POCT urinalysis dipstick  Result Value Ref Range   Color, UA yellow    Clarity, UA clear    Glucose, UA Negative Negative   Bilirubin, UA neg    Ketones, UA neg    Spec Grav, UA 1.025 1.010 - 1.025   Blood, UA neg    pH, UA 5.5 5.0 - 8.0   Protein, UA Negative Negative   Urobilinogen, UA 0.2 0.2 or 1.0 E.U./dL   Nitrite, UA neg    Leukocytes, UA Negative Negative   Appearance     Odor    POCT glycosylated hemoglobin (Hb A1C)  Result Value Ref Range   Hemoglobin A1C 5.7 (A) 4.0 - 5.6 %   HbA1c POC (<> result, manual entry)  4.0 - 5.6 %   HbA1c, POC (prediabetic range)  5.7 - 6.4 %   HbA1c, POC (controlled diabetic range)  0.0 - 7.0 %  POCT UA - Microalbumin  Result Value Ref Range   Microalbumin Ur, POC 30 mg/L   Creatinine, POC 300 mg/dL   Albumin/Creatinine Ratio, Urine, POC <30    EKG showed NSR. New RBBB and prolonged QT which is a change from 09/2016. Pt is asymptomatic. No new medications except topirimate. I do not see any reason for OT prolongation. Surgery is not scheduled to 09/01/2017. I will let PCP review when she gets back from vacation before sending surgical clearance. All other  labs look great and cleared for surgery.   Iran Planas PA-C

## 2017-08-06 DIAGNOSIS — Z01818 Encounter for other preprocedural examination: Secondary | ICD-10-CM | POA: Insufficient documentation

## 2017-08-06 NOTE — Progress Notes (Signed)
Cbc normal

## 2017-08-23 ENCOUNTER — Other Ambulatory Visit: Payer: Self-pay | Admitting: Cardiology

## 2017-09-01 DIAGNOSIS — M23321 Other meniscus derangements, posterior horn of medial meniscus, right knee: Secondary | ICD-10-CM | POA: Diagnosis not present

## 2017-09-01 DIAGNOSIS — M2241 Chondromalacia patellae, right knee: Secondary | ICD-10-CM | POA: Diagnosis not present

## 2017-09-01 DIAGNOSIS — M94261 Chondromalacia, right knee: Secondary | ICD-10-CM | POA: Diagnosis not present

## 2017-09-01 DIAGNOSIS — G8918 Other acute postprocedural pain: Secondary | ICD-10-CM | POA: Diagnosis not present

## 2017-09-09 NOTE — Telephone Encounter (Signed)
FAXED via EPIC fax function  [facility] Fax: [phone] Charlie Pitter, PA-C [[id number]] 07/23/2017 3:52 PM

## 2017-09-16 ENCOUNTER — Encounter: Payer: Self-pay | Admitting: Family Medicine

## 2017-09-17 MED ORDER — BUPROPION HCL ER (XL) 150 MG PO TB24: 150.0000 mg | ORAL_TABLET | ORAL | 2 refills | Status: DC

## 2017-10-31 ENCOUNTER — Encounter: Payer: Self-pay | Admitting: Family Medicine

## 2017-11-08 ENCOUNTER — Other Ambulatory Visit: Payer: Self-pay | Admitting: Physician Assistant

## 2017-11-10 NOTE — Telephone Encounter (Signed)
Rx(s) sent to pharmacy electronically.  

## 2017-11-17 DIAGNOSIS — M47817 Spondylosis without myelopathy or radiculopathy, lumbosacral region: Secondary | ICD-10-CM | POA: Diagnosis not present

## 2017-11-17 DIAGNOSIS — M545 Low back pain, unspecified: Secondary | ICD-10-CM | POA: Insufficient documentation

## 2017-11-21 DIAGNOSIS — Z23 Encounter for immunization: Secondary | ICD-10-CM | POA: Diagnosis not present

## 2017-11-27 DIAGNOSIS — M545 Low back pain: Secondary | ICD-10-CM | POA: Diagnosis not present

## 2017-12-02 DIAGNOSIS — M545 Low back pain: Secondary | ICD-10-CM | POA: Diagnosis not present

## 2017-12-04 DIAGNOSIS — M545 Low back pain: Secondary | ICD-10-CM | POA: Diagnosis not present

## 2017-12-09 DIAGNOSIS — M545 Low back pain: Secondary | ICD-10-CM | POA: Diagnosis not present

## 2017-12-12 DIAGNOSIS — M545 Low back pain: Secondary | ICD-10-CM | POA: Diagnosis not present

## 2017-12-23 ENCOUNTER — Other Ambulatory Visit: Payer: Self-pay | Admitting: Cardiology

## 2018-01-05 ENCOUNTER — Other Ambulatory Visit: Payer: Self-pay | Admitting: Cardiology

## 2018-01-05 MED ORDER — LOSARTAN POTASSIUM 50 MG PO TABS: 50.0000 mg | ORAL_TABLET | Freq: Every day | ORAL | 0 refills | Status: DC

## 2018-01-05 MED ORDER — ROSUVASTATIN CALCIUM 40 MG PO TABS: 40.0000 mg | ORAL_TABLET | Freq: Every day | ORAL | 0 refills | Status: DC

## 2018-01-05 NOTE — Telephone Encounter (Signed)
Refill sent to the pharmacy electronically.  

## 2018-01-05 NOTE — Telephone Encounter (Signed)
°*  STAT* If patient is at the pharmacy, call can be transferred to refill team.   1. Which medications need to be refilled? (please list name of each medication and dose if known)   losartan (COZAAR) 50 MG tablet    [facility]   rosuvastatin (CRESTOR) 40 MG tablet - send to [facility].    2. Which pharmacy/location (including street and city if local pharmacy) is medication to be sent to?  [id number] [place], [place], [place] [address]   3. Do they need a 30 day or 90 day supply?  30 days   Patient has appt on 03/06/2018 with Dr. Stanford Breed.

## 2018-01-05 NOTE — Telephone Encounter (Signed)
This is Dr. Crenshaw's pt 

## 2018-02-03 ENCOUNTER — Other Ambulatory Visit: Payer: Self-pay | Admitting: Family Medicine

## 2018-02-04 NOTE — Telephone Encounter (Signed)
Routing to pcp for signature.Blair Lundeen Lynetta, CMA  

## 2018-02-05 ENCOUNTER — Telehealth: Payer: Self-pay

## 2018-02-05 NOTE — Telephone Encounter (Signed)
Ambien failed to go to the pharmacy. It needs to be refilled. I sent it earlier but it was declined.

## 2018-02-05 NOTE — Addendum Note (Signed)
Addended by: Narda Rutherford on: 02/05/2018 02:40 PM   Modules accepted: Orders

## 2018-02-05 NOTE — Telephone Encounter (Signed)
Refill failed to go to the pharmacy.

## 2018-02-06 MED ORDER — ZOLPIDEM TARTRATE 10 MG PO TABS: 10.0000 mg | ORAL_TABLET | Freq: Every evening | ORAL | 2 refills | Status: DC | PRN

## 2018-02-06 NOTE — Telephone Encounter (Signed)
I resent it.  We may have to call them and verify that they actually received it.  I am not sure why it failed.

## 2018-02-07 ENCOUNTER — Other Ambulatory Visit: Payer: Self-pay | Admitting: Cardiology

## 2018-02-17 ENCOUNTER — Encounter: Payer: Self-pay | Admitting: Cardiology

## 2018-02-23 ENCOUNTER — Other Ambulatory Visit: Payer: Self-pay | Admitting: Cardiology

## 2018-02-23 NOTE — Progress Notes (Signed)
HPI: FU coronary artery disease. History of empyema and VATS/decortication, HTN, and long-standing exertional shortness of breath. Abd ultrasound 2010 with no aneurysm. CTA 2010 showed no pulmonary embolus. Left and right heart catheterization in 11/10 showed normal left and right heart filling pressures and no pulmonary hypertension; 50% distal left main stenosis and 50% proximal LAD stenosis. IVUS and FFR were done of both lesions, and it appeared that both were not hemodynamically significant and not likely to be the cause of his symptoms. He had a cardiopulmonary stress test in 12/10 to try to elucidate the cause of his shortness of breath. This was actually suggestive of possible ischemia. Therefore, he did an ETT-myoview in 12/10 as well. He became quite short of breath but exercised 8 minutes with no ECG changes and no evidence for ischemia or infarction. It was decided to continue medical management. He had an ETT-myoview a year later in 12/11, showing no ischemia or infarction. Patient reported somewhat increased exertional dyspnea as well as occasional chest pain episodes, so ETT was done in 1/13, showing 6:20 exercise with discontinuation due to profound dyspnea and some chest pain. Given very severe symptoms with ETT and progressive symptoms, LHC and RHC repeated and showed normal right and left heart filling pressure. LHC showed stable to improved coronary disease, with 40% distal LM and 40-50% proximal LAD. Since last seen, he has mild dyspnea on exertion which is chronic and unchanged.  No orthopnea, PND, chest pain or syncope.  Minimal pedal edema.  Current Outpatient Medications  Medication Sig Dispense Refill  . aspirin 81 MG tablet Take 81 mg by mouth daily.      . diphenoxylate-atropine (LOMOTIL) 2.5-0.025 MG tablet Take 1 tablet by mouth 4 (four) times daily as needed for diarrhea or loose stools.    . Ibuprofen (ADVIL PO) Take by mouth as needed (pain).    Marland Kitchen losartan (COZAAR)  50 MG tablet Take 1 tablet (50 mg total) by mouth daily. <PLEASE MAKE APPOINTMENT FOR REFILLS> 90 tablet 0  . pantoprazole (PROTONIX) 40 MG tablet TAKE 1 TABLET (40 MG TOTAL) BY MOUTH DAILY. 30 tablet 6  . rosuvastatin (CRESTOR) 40 MG tablet Take 1 tablet (40 mg total) by mouth daily. 90 tablet 0  . zolpidem (AMBIEN) 10 MG tablet Take 1 tablet (10 mg total) by mouth at bedtime as needed. for sleep 30 tablet 2   No current facility-administered medications for this visit.      Past Medical History:  Diagnosis Date  . Arthritis   . BPH (benign prostatic hypertrophy)   . CAD (coronary artery disease) 2010   Blockage in artery/ widow maker  . CHF (congestive heart failure) ([place])   . Depression   . Diarrhea   . Hyperlipidemia   . Hypertension   . Obesity   . OSA on CPAP   . Pleurisy   . Sleep apnea     Past Surgical History:  Procedure Laterality Date  . LEFT AND RIGHT HEART CATHETERIZATION WITH CORONARY ANGIOGRAM N/A 03/05/2011   Procedure: LEFT AND RIGHT HEART CATHETERIZATION WITH CORONARY ANGIOGRAM;  Surgeon: Larey Dresser, MD;  Location: [facility] CATH LAB;  Service: Cardiovascular;  Laterality: N/A;  . [place]   right with drainage utilizing VAT for empyema  . TONSILLECTOMY      Social History   Socioeconomic History  . Marital status: Married    Spouse name: Not on file  . Number of children: 1  . Years  of education: Not on file  . Highest education level: Not on file  Occupational History  . Occupation: Retired  Scientific laboratory technician  . Financial resource strain: Not on file  . Food insecurity:    Worry: Not on file    Inability: Not on file  . Transportation needs:    Medical: Not on file    Non-medical: Not on file  Tobacco Use  . Smoking status: Never Smoker  . Smokeless tobacco: Never Used  . Tobacco comment: cigars  Substance and Sexual Activity  . Alcohol use: No  . Drug use: No  . Sexual activity: Not on file  Lifestyle  . Physical activity:     Days per week: Not on file    Minutes per session: Not on file  . Stress: Not on file  Relationships  . Social connections:    Talks on phone: Not on file    Gets together: Not on file    Attends religious service: Not on file    Active member of club or organization: Not on file    Attends meetings of clubs or organizations: Not on file    Relationship status: Not on file  . Intimate partner violence:    Fear of current or ex partner: Not on file    Emotionally abused: Not on file    Physically abused: Not on file    Forced sexual activity: Not on file  Other Topics Concern  . Not on file  Social History Narrative  . Not on file    Family History  Problem Relation Age of Onset  . Cancer Father 39       liver and lung  . Heart disease Mother   . Ulcers Mother   . Hypertension Mother   . Aneurysm Mother   . Hyperlipidemia Sister   . Hypertension Sister   . Colon cancer Maternal Grandmother        late 35's when dx   . Esophageal cancer Neg Hx   . Stomach cancer Neg Hx   . Rectal cancer Neg Hx     ROS: Fatigue but no fevers or chills, productive cough, hemoptysis, dysphasia, odynophagia, melena, hematochezia, dysuria, hematuria, rash, seizure activity, orthopnea, PND, pedal edema, claudication. Remaining systems are negative.  Physical Exam: Well-developed obese in no acute distress.  Skin is warm and dry.  HEENT is normal.  Neck is supple.  Chest is clear to auscultation with normal expansion.  Cardiovascular exam is regular rate and rhythm.  Abdominal exam nontender or distended. No masses palpated. Extremities show no edema. neuro grossly intact  ECG-normal sinus rhythm at a rate of 69, RV conduction delay.  Personally reviewed  A/P  1 coronary artery disease-patient denies chest pain.  Plan to continue medical therapy with aspirin and statin.  2 hypertension-patient's blood pressure is controlled today.  Continue present medications and follow.  Check  potassium and renal function.  3 hyperlipidemia-continue statin.  Check lipids and liver.  4 Fatigue-Check hemoglobin and TSH.  Kirk Ruths, MD

## 2018-02-23 NOTE — Telephone Encounter (Signed)
Patient needs to schedule an appointment for a refill.

## 2018-03-06 ENCOUNTER — Ambulatory Visit: Payer: Medicare Other | Admitting: Cardiology

## 2018-03-06 ENCOUNTER — Encounter: Payer: Self-pay | Admitting: Cardiology

## 2018-03-06 VITALS — BP 122/58 | HR 67 | Ht 75.0 in | Wt 278.6 lb

## 2018-03-06 DIAGNOSIS — I2510 Atherosclerotic heart disease of native coronary artery without angina pectoris: Secondary | ICD-10-CM | POA: Diagnosis not present

## 2018-03-06 DIAGNOSIS — E7800 Pure hypercholesterolemia, unspecified: Secondary | ICD-10-CM | POA: Diagnosis not present

## 2018-03-06 DIAGNOSIS — I10 Essential (primary) hypertension: Secondary | ICD-10-CM

## 2018-03-06 DIAGNOSIS — R5383 Other fatigue: Secondary | ICD-10-CM | POA: Diagnosis not present

## 2018-03-06 NOTE — Patient Instructions (Signed)
Medication Instructions:  NO CHANGE If you need a refill on your cardiac medications before your next appointment, please call your pharmacy.   Lab work: Your physician recommends that you HAVE LAB WORK TODAY If you have labs (blood work) drawn today and your tests are completely normal, you will receive your results only by: Marland Kitchen MyChart Message (if you have MyChart) OR . A paper copy in the mail If you have any lab test that is abnormal or we need to change your treatment, we will call you to review the results.  Follow-Up: At [facility], you and your health needs are our priority.  As part of our continuing mission to provide you with exceptional heart care, we have created designated Provider Care Teams.  These Care Teams include your primary Cardiologist (physician) and Advanced Practice Providers (APPs -  Physician Assistants and Nurse Practitioners) who all work together to provide you with the care you need, when you need it. You will need a follow up appointment in 12 months.  Please call our office 2 months in advance to schedule this appointment.  You may see Kirk Ruths, MD or one of the following Advanced Practice Providers on your designated Care Team:   Kerin Ransom, PA-C Roby Lofts, [place] . Sande Rives, PA-C  CALL IN December TO SCHEDULE APPOINTMENT IN [place]

## 2018-03-07 LAB — CBC
Hematocrit: 39.3 % (ref 37.5–51.0)
Hemoglobin: 13.7 g/dL (ref 13.0–17.7)
MCH: 30.2 pg (ref 26.6–33.0)
MCHC: 34.9 g/dL (ref 31.5–35.7)
MCV: 87 fL (ref 79–97)
Platelets: 220 10*3/uL (ref 150–450)
RBC: 4.54 x10E6/uL (ref 4.14–5.80)
RDW: 12.3 % (ref 11.6–15.4)
WBC: 5.7 10*3/uL (ref 3.4–10.8)

## 2018-03-07 LAB — COMPREHENSIVE METABOLIC PANEL
A/G RATIO: 2 (ref 1.2–2.2)
ALT: 27 IU/L (ref 0–44)
AST: 26 IU/L (ref 0–40)
Albumin: 4.3 g/dL (ref 3.7–4.7)
Alkaline Phosphatase: 51 IU/L (ref 39–117)
BILIRUBIN TOTAL: 0.6 mg/dL (ref 0.0–1.2)
BUN/Creatinine Ratio: 16 (ref 10–24)
BUN: 19 mg/dL (ref 8–27)
CO2: 16 mmol/L — ABNORMAL LOW (ref 20–29)
Calcium: 9.3 mg/dL (ref 8.6–10.2)
Chloride: 103 mmol/L (ref 96–106)
Creatinine, Ser: 1.17 mg/dL (ref 0.76–1.27)
GFR calc Af Amer: 71 mL/min/{1.73_m2} (ref >59)
GFR calc non Af Amer: 61 mL/min/{1.73_m2} (ref >59)
Globulin, Total: 2.1 g/dL (ref 1.5–4.5)
Glucose: 95 mg/dL (ref 65–99)
POTASSIUM: 4.1 mmol/L (ref 3.5–5.2)
Sodium: 137 mmol/L (ref 134–144)
Total Protein: 6.4 g/dL (ref 6.0–8.5)

## 2018-03-07 LAB — LIPID PANEL
Chol/HDL Ratio: 2.8 ratio (ref 0.0–5.0)
Cholesterol, Total: 110 mg/dL (ref 100–199)
HDL: 39 mg/dL — ABNORMAL LOW (ref >39)
LDL Calculated: 40 mg/dL (ref 0–99)
TRIGLYCERIDES: 153 mg/dL — AB (ref 0–149)
VLDL Cholesterol Cal: 31 mg/dL (ref 5–40)

## 2018-03-07 LAB — TSH: TSH: 3.31 u[IU]/mL (ref 0.450–4.500)

## 2018-03-31 ENCOUNTER — Other Ambulatory Visit: Payer: Self-pay | Admitting: Cardiology

## 2018-04-03 ENCOUNTER — Other Ambulatory Visit: Payer: Self-pay | Admitting: Cardiology

## 2018-06-01 DIAGNOSIS — G4733 Obstructive sleep apnea (adult) (pediatric): Secondary | ICD-10-CM | POA: Diagnosis not present

## 2018-06-05 DIAGNOSIS — G4733 Obstructive sleep apnea (adult) (pediatric): Secondary | ICD-10-CM | POA: Diagnosis not present

## 2018-06-20 ENCOUNTER — Other Ambulatory Visit: Payer: Self-pay | Admitting: Cardiology

## 2018-07-19 ENCOUNTER — Other Ambulatory Visit: Payer: Self-pay | Admitting: Cardiology

## 2018-07-28 DIAGNOSIS — G4733 Obstructive sleep apnea (adult) (pediatric): Secondary | ICD-10-CM | POA: Diagnosis not present

## 2018-08-09 ENCOUNTER — Other Ambulatory Visit: Payer: Self-pay | Admitting: Family Medicine

## 2018-08-11 ENCOUNTER — Other Ambulatory Visit: Payer: Self-pay | Admitting: *Deleted

## 2018-08-11 NOTE — Telephone Encounter (Signed)
[facility] sent to [facility] instead of [facility]. Reports that [facility] is [facility], also would like a 90 day supply .Jeremy Woodward, [place]

## 2018-08-12 MED ORDER — ZOLPIDEM TARTRATE 10 MG PO TABS: ORAL_TABLET | ORAL | 0 refills | Status: DC

## 2018-08-27 DIAGNOSIS — G4733 Obstructive sleep apnea (adult) (pediatric): Secondary | ICD-10-CM | POA: Diagnosis not present

## 2018-09-27 DIAGNOSIS — G4733 Obstructive sleep apnea (adult) (pediatric): Secondary | ICD-10-CM | POA: Diagnosis not present

## 2018-10-28 DIAGNOSIS — G4733 Obstructive sleep apnea (adult) (pediatric): Secondary | ICD-10-CM | POA: Diagnosis not present

## 2018-11-03 DIAGNOSIS — Z23 Encounter for immunization: Secondary | ICD-10-CM | POA: Diagnosis not present

## 2018-11-11 ENCOUNTER — Encounter: Payer: Self-pay | Admitting: Family Medicine

## 2018-11-11 ENCOUNTER — Other Ambulatory Visit: Payer: Self-pay

## 2018-11-11 ENCOUNTER — Ambulatory Visit (INDEPENDENT_AMBULATORY_CARE_PROVIDER_SITE_OTHER): Payer: Medicare HMO

## 2018-11-11 ENCOUNTER — Ambulatory Visit (INDEPENDENT_AMBULATORY_CARE_PROVIDER_SITE_OTHER): Payer: Medicare HMO | Admitting: Family Medicine

## 2018-11-11 VITALS — BP 132/56 | HR 71 | Ht 75.0 in | Wt 278.0 lb

## 2018-11-11 DIAGNOSIS — R0602 Shortness of breath: Secondary | ICD-10-CM

## 2018-11-11 DIAGNOSIS — Z0000 Encounter for general adult medical examination without abnormal findings: Secondary | ICD-10-CM

## 2018-11-11 DIAGNOSIS — R1013 Epigastric pain: Secondary | ICD-10-CM

## 2018-11-11 DIAGNOSIS — R200 Anesthesia of skin: Secondary | ICD-10-CM | POA: Diagnosis not present

## 2018-11-11 DIAGNOSIS — L309 Dermatitis, unspecified: Secondary | ICD-10-CM | POA: Diagnosis not present

## 2018-11-11 DIAGNOSIS — Z125 Encounter for screening for malignant neoplasm of prostate: Secondary | ICD-10-CM

## 2018-11-11 DIAGNOSIS — R197 Diarrhea, unspecified: Secondary | ICD-10-CM | POA: Diagnosis not present

## 2018-11-11 DIAGNOSIS — M7989 Other specified soft tissue disorders: Secondary | ICD-10-CM | POA: Diagnosis not present

## 2018-11-11 DIAGNOSIS — R140 Abdominal distension (gaseous): Secondary | ICD-10-CM | POA: Diagnosis not present

## 2018-11-11 DIAGNOSIS — H60542 Acute eczematoid otitis externa, left ear: Secondary | ICD-10-CM | POA: Diagnosis not present

## 2018-11-11 DIAGNOSIS — R202 Paresthesia of skin: Secondary | ICD-10-CM

## 2018-11-11 MED ORDER — TRIAMCINOLONE ACETONIDE 0.1 % EX CREA: TOPICAL_CREAM | CUTANEOUS | 99 refills | Status: DC

## 2018-11-11 NOTE — Assessment & Plan Note (Signed)
Will tx with topical triamcinolone

## 2018-11-11 NOTE — Patient Instructions (Signed)
Health Maintenance After Age 74 After age 74, you are at a higher risk for certain long-term diseases and infections as well as injuries from falls. Falls are a major cause of broken bones and head injuries in people who are older than age 74. Getting regular preventive care can help to keep you healthy and well. Preventive care includes getting regular testing and making lifestyle changes as recommended by your health care provider. Talk with your health care provider about:  Which screenings and tests you should have. A screening is a test that checks for a disease when you have no symptoms.  A diet and exercise plan that is right for you. What should I know about screenings and tests to prevent falls? Screening and testing are the best ways to find a health problem early. Early diagnosis and treatment give you the best chance of managing medical conditions that are common after age 74. Certain conditions and lifestyle choices may make you more likely to have a fall. Your health care provider may recommend:  Regular vision checks. Poor vision and conditions such as cataracts can make you more likely to have a fall. If you wear glasses, make sure to get your prescription updated if your vision changes.  Medicine review. Work with your health care provider to regularly review all of the medicines you are taking, including over-the-counter medicines. Ask your health care provider about any side effects that may make you more likely to have a fall. Tell your health care provider if any medicines that you take make you feel dizzy or sleepy.  Osteoporosis screening. Osteoporosis is a condition that causes the bones to get weaker. This can make the bones weak and cause them to break more easily.  Blood pressure screening. Blood pressure changes and medicines to control blood pressure can make you feel dizzy.  Strength and balance checks. Your health care provider may recommend certain tests to check your  strength and balance while standing, walking, or changing positions.  Foot health exam. Foot pain and numbness, as well as not wearing proper footwear, can make you more likely to have a fall.  Depression screening. You may be more likely to have a fall if you have a fear of falling, feel emotionally low, or feel unable to do activities that you used to do.  Alcohol use screening. Using too much alcohol can affect your balance and may make you more likely to have a fall. What actions can I take to lower my risk of falls? General instructions  Talk with your health care provider about your risks for falling. Tell your health care provider if: ? You fall. Be sure to tell your health care provider about all falls, even ones that seem minor. ? You feel dizzy, sleepy, or off-balance.  Take over-the-counter and prescription medicines only as told by your health care provider. These include any supplements.  Eat a healthy diet and maintain a healthy weight. A healthy diet includes low-fat dairy products, low-fat (lean) meats, and fiber from whole grains, beans, and lots of fruits and vegetables. Home safety  Remove any tripping hazards, such as rugs, cords, and clutter.  Install safety equipment such as grab bars in bathrooms and safety rails on stairs.  Keep rooms and walkways well-lit. Activity   Follow a regular exercise program to stay fit. This will help you maintain your balance. Ask your health care provider what types of exercise are appropriate for you.  If you need a cane or   walker, use it as recommended by your health care provider.  Wear supportive shoes that have nonskid soles. Lifestyle  Do not drink alcohol if your health care provider tells you not to drink.  If you drink alcohol, limit how much you have: ? 0-1 drink a day for women. ? 0-2 drinks a day for men.  Be aware of how much alcohol is in your drink. In the [place] one drink equals one typical bottle of beer (12  oz), one-half glass of wine (5 oz), or one shot of hard liquor (1 oz).  Do not use any products that contain nicotine or tobacco, such as cigarettes and e-cigarettes. If you need help quitting, ask your health care provider. Summary  Having a healthy lifestyle and getting preventive care can help to protect your health and wellness after age 74.  Screening and testing are the best way to find a health problem early and help you avoid having a fall. Early diagnosis and treatment give you the best chance for managing medical conditions that are more common for people who are older than age 74.  Falls are a major cause of broken bones and head injuries in people who are older than age 74. Take precautions to prevent a fall at home.  Work with your health care provider to learn what changes you can make to improve your health and wellness and to prevent falls. This information is not intended to replace advice given to you by your health care provider. Make sure you discuss any questions you have with your health care provider. Document Released: 11/27/2016 Document Revised: 05/07/2018 Document Reviewed: 11/27/2016 Elsevier Patient Education  2020 [employer]  

## 2018-11-11 NOTE — Assessment & Plan Note (Signed)
Keep up a regular exercise program and make sure you are eating a healthy diet Try to eat 4 servings of dairy a day, or if you are lactose intolerant take a calcium with vitamin D daily.  Your vaccines are up to date.  Fall risk screen done.   Flu shot up to date.

## 2018-11-11 NOTE — Progress Notes (Addendum)
Established Patient Office Visit  Subjective:  Patient ID: Jeremy Woodward, male    DOB: 09-Sep-1945  Age: 73 y.o. MRN: [id number]  CC:  Chief Complaint  Patient presents with  . Annual Exam    HPI Jeremy Woodward presents for CPE.  He is doing well overall.  Tetanus vaccine is up-to-date.  Pneumonia vaccines are up-to-date.  He already had a flu shot this year.  He actually has his eye exam scheduled next Wednesday in [place].  Colonoscopy was done 2 years ago and is up-to-date.  He did have a couple of days concerns that he would like to discuss today.  Complaining of abdominal bloating that been going on for several months.  He says that usually during the day he will start to feel really bloated and full his abdomen will protrude to the point where he feels like his actually pushing up on his chest and making him feel short of breath at times.  He is in a lot of pressure in the epigastric area he complains that he has been burping and having a lot more gas than usual.  He feels like his bowels are moving normally has usually 2 bowel movements per day and says every couple days will actually have some loose stools.  He says he is even been trying to cut back on his portions to see if this would help but has not made a big difference.  He has not had any recent heartburn though he does take a PPI occasionally.  Also reports that he has had some numbness in his feet x 2-3 months.  He feels like particularly over his great toe and little toe and a few weeks ago actually had significant redness and swelling over the third fourth and fifth toes on his foot with the redness spreading down his foot.  He said it was very painful and itchy and lasted about 3 days.  He does not remember any trauma injury bug bites etc.   He also has a cream that he uses rarely but has expired for a scaly rash in his ear. He would like a refill. Brought old tube of triamcinolone 0.1 cream.   Past Medical History:   Diagnosis Date  . Arthritis   . BPH (benign prostatic hypertrophy)   . CAD (coronary artery disease) 2010   Blockage in artery/ widow maker  . CHF (congestive heart failure) ([place])   . Depression   . Diarrhea   . Hyperlipidemia   . Hypertension   . Obesity   . OSA on CPAP   . Pleurisy   . Sleep apnea     Past Surgical History:  Procedure Laterality Date  . LEFT AND RIGHT HEART CATHETERIZATION WITH CORONARY ANGIOGRAM N/A 03/05/2011   Procedure: LEFT AND RIGHT HEART CATHETERIZATION WITH CORONARY ANGIOGRAM;  Surgeon: Larey Dresser, MD;  Location: [facility] CATH LAB;  Service: Cardiovascular;  Laterality: N/A;  . [place]   right with drainage utilizing VAT for empyema  . TONSILLECTOMY      Family History  Problem Relation Age of Onset  . Cancer Father 49       liver and lung  . Heart disease Mother   . Ulcers Mother   . Hypertension Mother   . Aneurysm Mother   . Hyperlipidemia Sister   . Hypertension Sister   . Colon cancer Maternal Grandmother        late 27's when dx   . Esophageal  cancer Neg Hx   . Stomach cancer Neg Hx   . Rectal cancer Neg Hx     Social History   Socioeconomic History  . Marital status: Married    Spouse name: Not on file  . Number of children: 1  . Years of education: Not on file  . Highest education level: Not on file  Occupational History  . Occupation: Retired  Scientific laboratory technician  . Financial resource strain: Not on file  . Food insecurity    Worry: Not on file    Inability: Not on file  . Transportation needs    Medical: Not on file    Non-medical: Not on file  Tobacco Use  . Smoking status: Never Smoker  . Smokeless tobacco: Never Used  . Tobacco comment: cigars  Substance and Sexual Activity  . Alcohol use: No  . Drug use: No  . Sexual activity: Not on file  Lifestyle  . Physical activity    Days per week: Not on file    Minutes per session: Not on file  . Stress: Not on file  Relationships  . Social Product manager on phone: Not on file    Gets together: Not on file    Attends religious service: Not on file    Active member of club or organization: Not on file    Attends meetings of clubs or organizations: Not on file    Relationship status: Not on file  . Intimate partner violence    Fear of current or ex partner: Not on file    Emotionally abused: Not on file    Physically abused: Not on file    Forced sexual activity: Not on file  Other Topics Concern  . Not on file  Social History Narrative  . Not on file    Outpatient Medications Prior to Visit  Medication Sig Dispense Refill  . aspirin 81 MG tablet Take 81 mg by mouth daily.      . diphenoxylate-atropine (LOMOTIL) 2.5-0.025 MG tablet Take 1 tablet by mouth 4 (four) times daily as needed for diarrhea or loose stools.    . Ibuprofen (ADVIL PO) Take by mouth as needed (pain).    Marland Kitchen losartan (COZAAR) 50 MG tablet Take 1 tablet (50 mg total) by mouth daily. 90 tablet 1  . Misc. Devices MISC CPAP machine patient preference  Autopap settings of 4-20 cm H20.    . pantoprazole (PROTONIX) 40 MG tablet TAKE 1 TABLET (40 MG TOTAL) BY MOUTH DAILY. 30 tablet 6  . rosuvastatin (CRESTOR) 40 MG tablet TAKE 1 TABLET BY MOUTH DAILY 90 tablet 2  . zolpidem (AMBIEN) 10 MG tablet TAKE ONE TABLET BY MOUTH EVERY NIGHT AT BEDTIME AS NEEDED FOR SLEEP 90 tablet 0  . losartan (COZAAR) 100 MG tablet Take 0.5 tablets (50 mg total) by mouth daily. 45 tablet 3   No facility-administered medications prior to visit.     No Known Allergies  ROS Review of Systems    Objective:    Physical Exam  Constitutional: He is oriented to person, place, and time. He appears well-developed and well-nourished.  HENT:  Head: Normocephalic and atraumatic.  Right Ear: External ear normal.  Left Ear: External ear normal.  Nose: Nose normal.  Mouth/Throat: Oropharynx is clear and moist.  Does have  Dry patch at the 12 o'clcok position that is scalying and entrance  of canal.    Eyes: Pupils are equal, round, and reactive to light. Conjunctivae  and EOM are normal.  Neck: Normal range of motion. Neck supple. No thyromegaly present.  Cardiovascular: Normal rate, regular rhythm, normal heart sounds and intact distal pulses.  Pulmonary/Chest: Effort normal and breath sounds normal.  Abdominal: Soft. Bowel sounds are normal. He exhibits no distension and no mass. There is no abdominal tenderness. There is no rebound and no guarding.  Diastasis rectus  Musculoskeletal: Normal range of motion.  Lymphadenopathy:    He has no cervical adenopathy.  Neurological: He is alert and oriented to person, place, and time. He has normal reflexes.  Skin: Skin is warm and dry.  Psychiatric: He has a normal mood and affect. His behavior is normal. Judgment and thought content normal.    BP (!) 132/56   Pulse 71   Ht 6\' 3"  (1.905 m)   Wt 278 lb (126.1 kg)   SpO2 100%   BMI 34.75 kg/m  Wt Readings from Last 3 Encounters:  11/11/18 278 lb (126.1 kg)  03/06/18 278 lb 9.6 oz (126.4 kg)  08/05/17 278 lb (126.1 kg)    There are no preventive care reminders to display for this patient.  There are no preventive care reminders to display for this patient.  Lab Results  Component Value Date   TSH 3.310 03/06/2018   Lab Results  Component Value Date   WBC 5.7 03/06/2018   HGB 13.7 03/06/2018   HCT 39.3 03/06/2018   MCV 87 03/06/2018   PLT 220 03/06/2018   Lab Results  Component Value Date   NA 137 03/06/2018   K 4.1 03/06/2018   CO2 16 (L) 03/06/2018   GLUCOSE 95 03/06/2018   BUN 19 03/06/2018   CREATININE 1.17 03/06/2018   BILITOT 0.6 03/06/2018   ALKPHOS 51 03/06/2018   AST 26 03/06/2018   ALT 27 03/06/2018   PROT 6.4 03/06/2018   ALBUMIN 4.3 03/06/2018   CALCIUM 9.3 03/06/2018   GFR 55.61 (L) 03/01/2011   Lab Results  Component Value Date   CHOL 110 03/06/2018   Lab Results  Component Value Date   HDL 39 (L) 03/06/2018   Lab Results   Component Value Date   LDLCALC 40 03/06/2018   Lab Results  Component Value Date   TRIG 153 (H) 03/06/2018   Lab Results  Component Value Date   CHOLHDL 2.8 03/06/2018   Lab Results  Component Value Date   HGBA1C 5.7 (A) 08/05/2017      Assessment & Plan:   Problem List Items Addressed This Visit      Nervous and Auditory   Eczema of left external ear    Will tx with topical triamcinolone        Other   Wellness examination - Primary    Keep up a regular exercise program and make sure you are eating a healthy diet Try to eat 4 servings of dairy a day, or if you are lactose intolerant take a calcium with vitamin D daily.  Your vaccines are up to date.  Fall risk screen done.   Flu shot up to date.        Relevant Orders   BASIC METABOLIC PANEL WITH GFR   PSA   DG Abd 1 View   B12   CBC with Differential/Platelet   Vitamin B6   Vitamin B1    Other Visit Diagnoses    Screening for malignant neoplasm of prostate       Relevant Orders   PSA   Numbness and tingling of  foot       Relevant Orders   BASIC METABOLIC PANEL WITH GFR   PSA   DG Abd 1 View   B12   CBC with Differential/Platelet   Vitamin B6   Vitamin B1   Foot swelling       Relevant Orders   BASIC METABOLIC PANEL WITH GFR   PSA   DG Abd 1 View   B12   CBC with Differential/Platelet   Vitamin B6   Vitamin B1   Bloating       Relevant Orders   BASIC METABOLIC PANEL WITH GFR   PSA   DG Abd 1 View   B12   CBC with Differential/Platelet   Vitamin B6   Vitamin B1   Epigastric pressure       Relevant Orders   BASIC METABOLIC PANEL WITH GFR   PSA   DG Abd 1 View   B12   CBC with Differential/Platelet   Vitamin B6   Vitamin B1   SOB (shortness of breath)       Relevant Orders   DG Chest 2 View   Eczema       Relevant Medications   triamcinolone cream (KENALOG) 0.1 %     SOB- triggered by abdominal bloating.  But because of prior history lung problems on the right side of been  going to get a chest x-ray today.  Suspect he probably has some significant scar tissue at the base.  Bloating/epigastric pressure-he does not feel like it is necessarily related to any specific foods or dairy products.  I did explain that sometimes this patient's age they become more lactose intolerant.  He seems to be stooling fairly regularly though he does have occasional diarrhea some to get a KUB today for further work-up.  His colon cancer screening is actually up-to-date he does have a history of colon polyps.  He denies any recent heartburn or reflux symptoms.  Keep up a regular exercise program and make sure you are eating a healthy diet Try to eat 4 servings of dairy a day, or if you are lactose intolerant take a calcium with vitamin D daily.  Your vaccines are up to date.    Toe swelling/erythema-he had an episode that lasted about 3 days and then resolved on its own. He has never had this before.   No prior history or family history of gout.  Will check CBC and uric acid.   Eczema of left external ear Will tx with topical triamcinolone  Wellness examination Keep up a regular exercise program and make sure you are eating a healthy diet Try to eat 4 servings of dairy a day, or if you are lactose intolerant take a calcium with vitamin D daily.  Your vaccines are up to date.  Fall risk screen done.   Flu shot up to date.     Meds ordered this encounter  Medications  . triamcinolone cream (KENALOG) 0.1 %    Sig: Apply to affected areas twice a day for up to two weeks.    Dispense:  45 g    Refill:  prn    Follow-up: Return in about 4 weeks (around 12/09/2018) for abdominal bloating.    Beatrice Lecher, MD

## 2018-11-15 LAB — BASIC METABOLIC PANEL WITH GFR
BUN/Creatinine Ratio: 15 (calc) (ref 6–22)
BUN: 19 mg/dL (ref 7–25)
CO2: 25 mmol/L (ref 20–32)
Calcium: 9.6 mg/dL (ref 8.6–10.3)
Chloride: 104 mmol/L (ref 98–110)
Creat: 1.25 mg/dL — ABNORMAL HIGH (ref 0.70–1.18)
GFR, Est African American: 65 mL/min/{1.73_m2} (ref >=60)
GFR, Est Non African American: 56 mL/min/{1.73_m2} — ABNORMAL LOW (ref >=60)
Glucose, Bld: 105 mg/dL — ABNORMAL HIGH (ref 65–99)
Potassium: 4.8 mmol/L (ref 3.5–5.3)
Sodium: 139 mmol/L (ref 135–146)

## 2018-11-15 LAB — CBC WITH DIFFERENTIAL/PLATELET
Absolute Monocytes: 690 cells/uL (ref 200–950)
Basophils Absolute: 20 cells/uL (ref 0–200)
Basophils Relative: 0.3 %
Eosinophils Absolute: 47 cells/uL (ref 15–500)
Eosinophils Relative: 0.7 %
HCT: 43.1 % (ref 38.5–50.0)
Hemoglobin: 14.6 g/dL (ref 13.2–17.1)
Lymphs Abs: 1648 cells/uL (ref 850–3900)
MCH: 29.9 pg (ref 27.0–33.0)
MCHC: 33.9 g/dL (ref 32.0–36.0)
MCV: 88.1 fL (ref 80.0–100.0)
MPV: 10.2 fL (ref 7.5–12.5)
Monocytes Relative: 10.3 %
Neutro Abs: 4295 cells/uL (ref 1500–7800)
Neutrophils Relative %: 64.1 %
Platelets: 226 10*3/uL (ref 140–400)
RBC: 4.89 10*6/uL (ref 4.20–5.80)
RDW: 12.7 % (ref 11.0–15.0)
Total Lymphocyte: 24.6 %
WBC: 6.7 10*3/uL (ref 3.8–10.8)

## 2018-11-15 LAB — VITAMIN B6: Vitamin B6: 15.3 ng/mL (ref 2.1–21.7)

## 2018-11-15 LAB — VITAMIN B12: Vitamin B-12: 278 pg/mL (ref 200–1100)

## 2018-11-15 LAB — VITAMIN B1: Vitamin B1 (Thiamine): 12 nmol/L (ref 8–30)

## 2018-11-15 LAB — PSA: PSA: 1.3 ng/mL (ref <=4.0)

## 2018-11-17 ENCOUNTER — Other Ambulatory Visit: Payer: Self-pay | Admitting: Family Medicine

## 2018-11-17 DIAGNOSIS — H43393 Other vitreous opacities, bilateral: Secondary | ICD-10-CM | POA: Diagnosis not present

## 2018-11-17 DIAGNOSIS — H35033 Hypertensive retinopathy, bilateral: Secondary | ICD-10-CM | POA: Diagnosis not present

## 2018-11-17 DIAGNOSIS — H40013 Open angle with borderline findings, low risk, bilateral: Secondary | ICD-10-CM | POA: Diagnosis not present

## 2018-11-17 DIAGNOSIS — H524 Presbyopia: Secondary | ICD-10-CM | POA: Diagnosis not present

## 2018-11-17 DIAGNOSIS — H35363 Drusen (degenerative) of macula, bilateral: Secondary | ICD-10-CM | POA: Diagnosis not present

## 2018-11-18 NOTE — Telephone Encounter (Signed)
Last RX sent 08/12/18 for #90  Last OV 11/11/18  RX pended

## 2018-11-24 DIAGNOSIS — H52209 Unspecified astigmatism, unspecified eye: Secondary | ICD-10-CM | POA: Diagnosis not present

## 2018-11-24 DIAGNOSIS — H524 Presbyopia: Secondary | ICD-10-CM | POA: Diagnosis not present

## 2018-11-24 DIAGNOSIS — H5203 Hypermetropia, bilateral: Secondary | ICD-10-CM | POA: Diagnosis not present

## 2018-11-27 DIAGNOSIS — G4733 Obstructive sleep apnea (adult) (pediatric): Secondary | ICD-10-CM | POA: Diagnosis not present

## 2018-12-09 ENCOUNTER — Other Ambulatory Visit: Payer: Self-pay

## 2018-12-09 ENCOUNTER — Ambulatory Visit (INDEPENDENT_AMBULATORY_CARE_PROVIDER_SITE_OTHER): Payer: Medicare HMO | Admitting: Family Medicine

## 2018-12-09 ENCOUNTER — Encounter: Payer: Self-pay | Admitting: Family Medicine

## 2018-12-09 VITALS — BP 129/53 | HR 69 | Ht 75.0 in | Wt 273.0 lb

## 2018-12-09 DIAGNOSIS — R200 Anesthesia of skin: Secondary | ICD-10-CM | POA: Insufficient documentation

## 2018-12-09 DIAGNOSIS — R140 Abdominal distension (gaseous): Secondary | ICD-10-CM | POA: Diagnosis not present

## 2018-12-09 DIAGNOSIS — E538 Deficiency of other specified B group vitamins: Secondary | ICD-10-CM | POA: Diagnosis not present

## 2018-12-09 DIAGNOSIS — R202 Paresthesia of skin: Secondary | ICD-10-CM | POA: Diagnosis not present

## 2018-12-09 DIAGNOSIS — M79673 Pain in unspecified foot: Secondary | ICD-10-CM | POA: Insufficient documentation

## 2018-12-09 NOTE — Assessment & Plan Note (Signed)
Had some improvement with numbness and tingling with the b12.   Continue supplement and recheck levels in about 4-6 weeks.  If symptom not continuing to improve then let me know.

## 2018-12-09 NOTE — Patient Instructions (Signed)
And go to the lab in about 4 to 6 weeks to recheck your B12 level.

## 2018-12-09 NOTE — Progress Notes (Signed)
Established Patient Office Visit  Subjective:  Patient ID: Jeremy Woodward, male    DOB: 03/16/1945  Age: 73 y.o. MRN: [id number]  CC:  Chief Complaint  Patient presents with  . Follow-up    abdominal bloating    HPI TYGA KACH presents for follow-up of abdominal bloating.  When he was last seen about 4 weeks ago for his physical he complained of bloating as below: "Complaining of abdominal bloating that been going on for several months.  He says that usually during the day he will start to feel really bloated and full his abdomen will protrude to the point where he feels like his actually pushing up on his chest and making him feel short of breath at times.  He is in a lot of pressure in the epigastric area he complains that he has been burping and having a lot more gas than usual.  He feels like his bowels are moving normally has usually 2 bowel movements per day and says every couple days will actually have some loose stools.  He says he is even been trying to cut back on his portions to see if this would help but has not made a big difference.  He has not had any recent heartburn though he does take a PPI occasionally".  He denied any specific food triggers.  He feels like the bloating is better.  He cut out bread, milk, peanut butter and carbonated drinks. He is taking a probiotic as well. His weight is down about 5 lbs.   he says his abdominal pain and bloating has improved by about 65 to 70% overall.  He is also cut out milk and cut back on cheese intake.  Recent labs showed borderline low B12, so he has started an over-the-counter supplement. B12 is 278.  He says the numbness and tingling in his legs and feet has improved by about 30 to 35% since starting the B12.   Past Medical History:  Diagnosis Date  . Arthritis   . BPH (benign prostatic hypertrophy)   . CAD (coronary artery disease) 2010   Blockage in artery/ widow maker  . CHF (congestive heart failure) ([place])   .  Depression   . Diarrhea   . Hyperlipidemia   . Hypertension   . Obesity   . OSA on CPAP   . Pleurisy   . Sleep apnea     Past Surgical History:  Procedure Laterality Date  . LEFT AND RIGHT HEART CATHETERIZATION WITH CORONARY ANGIOGRAM N/A 03/05/2011   Procedure: LEFT AND RIGHT HEART CATHETERIZATION WITH CORONARY ANGIOGRAM;  Surgeon: Larey Dresser, MD;  Location: [facility] CATH LAB;  Service: Cardiovascular;  Laterality: N/A;  . [place]   right with drainage utilizing VAT for empyema  . TONSILLECTOMY      Family History  Problem Relation Age of Onset  . Cancer Father 20       liver and lung  . Heart disease Mother   . Ulcers Mother   . Hypertension Mother   . Aneurysm Mother   . Hyperlipidemia Sister   . Hypertension Sister   . Colon cancer Maternal Grandmother        late 4's when dx   . Esophageal cancer Neg Hx   . Stomach cancer Neg Hx   . Rectal cancer Neg Hx     Social History   Socioeconomic History  . Marital status: Married    Spouse name: Not on file  .  Number of children: 1  . Years of education: Not on file  . Highest education level: Not on file  Occupational History  . Occupation: Retired  Scientific laboratory technician  . Financial resource strain: Not on file  . Food insecurity    Worry: Not on file    Inability: Not on file  . Transportation needs    Medical: Not on file    Non-medical: Not on file  Tobacco Use  . Smoking status: Never Smoker  . Smokeless tobacco: Never Used  . Tobacco comment: cigars  Substance and Sexual Activity  . Alcohol use: No  . Drug use: No  . Sexual activity: Not on file  Lifestyle  . Physical activity    Days per week: Not on file    Minutes per session: Not on file  . Stress: Not on file  Relationships  . Social Herbalist on phone: Not on file    Gets together: Not on file    Attends religious service: Not on file    Active member of club or organization: Not on file    Attends meetings of clubs  or organizations: Not on file    Relationship status: Not on file  . Intimate partner violence    Fear of current or ex partner: Not on file    Emotionally abused: Not on file    Physically abused: Not on file    Forced sexual activity: Not on file  Other Topics Concern  . Not on file  Social History Narrative  . Not on file    Outpatient Medications Prior to Visit  Medication Sig Dispense Refill  . ACIDOPHILUS LACTOBACILLUS PO Take by mouth.    Marland Kitchen aspirin 81 MG tablet Take 81 mg by mouth daily.      . Cyanocobalamin (VITAMIN B 12 PO) Take 1,000 mcg by mouth daily.    . diphenoxylate-atropine (LOMOTIL) 2.5-0.025 MG tablet Take 1 tablet by mouth 4 (four) times daily as needed for diarrhea or loose stools.    . Ibuprofen (ADVIL PO) Take by mouth as needed (pain).    Marland Kitchen losartan (COZAAR) 50 MG tablet Take 1 tablet (50 mg total) by mouth daily. 90 tablet 1  . Misc. Devices MISC CPAP machine patient preference  Autopap settings of 4-20 cm H20.    . pantoprazole (PROTONIX) 40 MG tablet TAKE 1 TABLET (40 MG TOTAL) BY MOUTH DAILY. 30 tablet 6  . rosuvastatin (CRESTOR) 40 MG tablet TAKE 1 TABLET BY MOUTH DAILY 90 tablet 2  . triamcinolone cream (KENALOG) 0.1 % Apply to affected areas twice a day for up to two weeks. 45 g prn  . zolpidem (AMBIEN) 10 MG tablet TAKE ONE TABLET BY MOUTH EVERY NIGHT AT BEDTIME AS NEEDED FOR SLEEP 90 tablet 0   No facility-administered medications prior to visit.     No Known Allergies  ROS Review of Systems    Objective:    Physical Exam  Constitutional: He is oriented to person, place, and time. He appears well-developed and well-nourished.  HENT:  Head: Normocephalic and atraumatic.  Cardiovascular: Normal rate, regular rhythm and normal heart sounds.  Pulmonary/Chest: Effort normal and breath sounds normal.  Neurological: He is alert and oriented to person, place, and time.  Skin: Skin is warm and dry.  Psychiatric: He has a normal mood and affect.  His behavior is normal.    BP (!) 129/53   Pulse 69   Ht 6\' 3"  (1.905 m)  Wt 273 lb (123.8 kg)   SpO2 99%   BMI 34.12 kg/m  Wt Readings from Last 3 Encounters:  12/09/18 273 lb (123.8 kg)  11/11/18 278 lb (126.1 kg)  03/06/18 278 lb 9.6 oz (126.4 kg)     There are no preventive care reminders to display for this patient.  There are no preventive care reminders to display for this patient.  Lab Results  Component Value Date   TSH 3.310 03/06/2018   Lab Results  Component Value Date   WBC 6.7 11/11/2018   HGB 14.6 11/11/2018   HCT 43.1 11/11/2018   MCV 88.1 11/11/2018   PLT 226 11/11/2018   Lab Results  Component Value Date   NA 139 11/11/2018   K 4.8 11/11/2018   CO2 25 11/11/2018   GLUCOSE 105 (H) 11/11/2018   BUN 19 11/11/2018   CREATININE 1.25 (H) 11/11/2018   BILITOT 0.6 03/06/2018   ALKPHOS 51 03/06/2018   AST 26 03/06/2018   ALT 27 03/06/2018   PROT 6.4 03/06/2018   ALBUMIN 4.3 03/06/2018   CALCIUM 9.6 11/11/2018   GFR 55.61 (L) 03/01/2011   Lab Results  Component Value Date   CHOL 110 03/06/2018   Lab Results  Component Value Date   HDL 39 (L) 03/06/2018   Lab Results  Component Value Date   LDLCALC 40 03/06/2018   Lab Results  Component Value Date   TRIG 153 (H) 03/06/2018   Lab Results  Component Value Date   CHOLHDL 2.8 03/06/2018   Lab Results  Component Value Date   HGBA1C 5.7 (A) 08/05/2017      Assessment & Plan:   Problem List Items Addressed This Visit      Other   Numbness and tingling of foot    Had some improvement with numbness and tingling with the b12.   Continue supplement and recheck levels in about 4-6 weeks.  If symptom not continuing to improve then let me know.        Relevant Orders   B12   Bloating    Significantly improved with probiotic and dietary changes.  If not continuing to improve then call back in 1 mo and will call GI.        B12 deficiency - Primary    Had some improvement with  numbness and tingling with the b12.   Continue supplement and recheck levels in about 4-6 weeks.        Relevant Orders   B12      No orders of the defined types were placed in this encounter.   Follow-up: Return in about 4 months (around 04/08/2019) for BP, etc .    Beatrice Lecher, MD

## 2018-12-09 NOTE — Assessment & Plan Note (Signed)
Significantly improved with probiotic and dietary changes.  If not continuing to improve then call back in 1 mo and will call GI.

## 2018-12-09 NOTE — Assessment & Plan Note (Signed)
Had some improvement with numbness and tingling with the b12.   Continue supplement and recheck levels in about 4-6 weeks.

## 2018-12-28 DIAGNOSIS — G4733 Obstructive sleep apnea (adult) (pediatric): Secondary | ICD-10-CM | POA: Diagnosis not present

## 2019-01-14 ENCOUNTER — Other Ambulatory Visit: Payer: Self-pay | Admitting: Cardiology

## 2019-01-20 ENCOUNTER — Other Ambulatory Visit: Payer: Self-pay | Admitting: Cardiology

## 2019-01-27 DIAGNOSIS — G4733 Obstructive sleep apnea (adult) (pediatric): Secondary | ICD-10-CM | POA: Diagnosis not present

## 2019-02-17 ENCOUNTER — Ambulatory Visit: Payer: Medicare Other | Attending: Internal Medicine

## 2019-02-17 DIAGNOSIS — Z23 Encounter for immunization: Secondary | ICD-10-CM | POA: Insufficient documentation

## 2019-02-17 NOTE — Progress Notes (Signed)
   Covid-19 Vaccination Clinic  Name:  Jeremy Woodward    MRN: [id number] DOB: [birth year]  02/17/2019  [name] was observed post Covid-19 immunization for 15 minutes without incidence. He was provided with Vaccine Information Sheet and instruction to access the V-Safe system.   [name] was instructed to call 911 with any severe reactions post vaccine: Marland Kitchen Difficulty breathing  . Swelling of your face and throat  . A fast heartbeat  . A bad rash all over your body  . Dizziness and weakness    Immunizations Administered    Name Date Dose VIS Date Route   Pfizer COVID-19 Vaccine 02/17/2019 11:31 AM 0.3 mL 01/08/2019 Intramuscular   Manufacturer: [place]   Lot: [id number]   [place]: [id number]

## 2019-02-26 NOTE — Progress Notes (Signed)
HPI: FU coronary artery disease. History of empyema and VATS/decortication, HTN, and long-standing exertional shortness of breath. Abd ultrasound 2010 with no aneurysm. CTA 2010 showed no pulmonary embolus. Left and right heart catheterization in 11/10 showed normal left and right heart filling pressures and no pulmonary hypertension; 50% distal left main stenosis and 50% proximal LAD stenosis. IVUS and FFR were done of both lesions, and it appeared that both were not hemodynamically significant and not likely to be the cause of his symptoms. He had a cardiopulmonary stress test in 12/10 to try to elucidate the cause of his shortness of breath. This was actually suggestive of possible ischemia. Therefore, he did an ETT-myoview in 12/10 as well. He became quite short of breath but exercised 8 minutes with no ECG changes and no evidence for ischemia or infarction. It was decided to continue medical management. He had an ETT-myoview a year later in 12/11, showing no ischemia or infarction. Patient reported somewhat increased exertional dyspnea as well as occasional chest pain episodes, so ETT was done in 1/13, showing 6:20 exercise with discontinuation due to profound dyspnea and some chest pain. Given very severe symptoms with ETT and progressive symptoms, LHC and RHC repeated and showed normal right and left heart filling pressure. LHC showed stable to improved coronary disease, with 40% distal LM and 40-50% proximal LAD. Since last seen,  he has dyspnea on exertion which is chronic.  No orthopnea or PND.  Occasional mild pedal edema.  He has not had exertional chest pain or syncope.  Current Outpatient Medications  Medication Sig Dispense Refill  . ACIDOPHILUS LACTOBACILLUS PO Take by mouth.    Marland Kitchen aspirin 81 MG tablet Take 81 mg by mouth daily.      . Cyanocobalamin (VITAMIN B 12 PO) Take 1,000 mcg by mouth daily.    . diphenoxylate-atropine (LOMOTIL) 2.5-0.025 MG tablet Take 1 tablet by mouth 4  (four) times daily as needed for diarrhea or loose stools.    . Ibuprofen (ADVIL PO) Take by mouth as needed (pain).    Marland Kitchen losartan (COZAAR) 25 MG tablet TAKE 2 TABLETS ONCE A DAY 180 tablet 0  . Misc. Devices MISC CPAP machine patient preference  Autopap settings of 4-20 cm H20.    . pantoprazole (PROTONIX) 40 MG tablet TAKE 1 TABLET (40 MG TOTAL) BY MOUTH DAILY. 30 tablet 6  . rosuvastatin (CRESTOR) 40 MG tablet TAKE ONE TABLET BY MOUTH DAILY 90 tablet 1  . triamcinolone cream (KENALOG) 0.1 % Apply to affected areas twice a day for up to two weeks. 45 g prn  . zolpidem (AMBIEN) 10 MG tablet TAKE ONE TABLET BY MOUTH EVERY NIGHT AT BEDTIME AS NEEDED FOR SLEEP 90 tablet 0   No current facility-administered medications for this visit.     Past Medical History:  Diagnosis Date  . Arthritis   . BPH (benign prostatic hypertrophy)   . CAD (coronary artery disease) 2010   Blockage in artery/ widow maker  . CHF (congestive heart failure) ([place])   . Depression   . Diarrhea   . Hyperlipidemia   . Hypertension   . Obesity   . OSA on CPAP   . Pleurisy   . Sleep apnea     Past Surgical History:  Procedure Laterality Date  . LEFT AND RIGHT HEART CATHETERIZATION WITH CORONARY ANGIOGRAM N/A 03/05/2011   Procedure: LEFT AND RIGHT HEART CATHETERIZATION WITH CORONARY ANGIOGRAM;  Surgeon: Larey Dresser, MD;  Location: [facility] CATH  LAB;  Service: Cardiovascular;  Laterality: N/A;  . [place]   right with drainage utilizing VAT for empyema  . TONSILLECTOMY      Social History   Socioeconomic History  . Marital status: Married    Spouse name: Not on file  . Number of children: 1  . Years of education: Not on file  . Highest education level: Not on file  Occupational History  . Occupation: Retired  Tobacco Use  . Smoking status: Never Smoker  . Smokeless tobacco: Never Used  . Tobacco comment: cigars  Substance and Sexual Activity  . Alcohol use: No  . Drug use: No  .  Sexual activity: Not on file  Other Topics Concern  . Not on file  Social History Narrative  . Not on file   Social Determinants of Health   Financial Resource Strain:   . Difficulty of Paying Living Expenses: Not on file  Food Insecurity:   . Worried About Charity fundraiser in the Last Year: Not on file  . Ran Out of Food in the Last Year: Not on file  Transportation Needs:   . Lack of Transportation (Medical): Not on file  . Lack of Transportation (Non-Medical): Not on file  Physical Activity:   . Days of Exercise per Week: Not on file  . Minutes of Exercise per Session: Not on file  Stress:   . Feeling of Stress : Not on file  Social Connections:   . Frequency of Communication with Friends and Family: Not on file  . Frequency of Social Gatherings with Friends and Family: Not on file  . Attends Religious Services: Not on file  . Active Member of Clubs or Organizations: Not on file  . Attends Archivist Meetings: Not on file  . Marital Status: Not on file  Intimate Partner Violence:   . Fear of Current or Ex-Partner: Not on file  . Emotionally Abused: Not on file  . Physically Abused: Not on file  . Sexually Abused: Not on file    Family History  Problem Relation Age of Onset  . Cancer Father 65       liver and lung  . Heart disease Mother   . Ulcers Mother   . Hypertension Mother   . Aneurysm Mother   . Hyperlipidemia Sister   . Hypertension Sister   . Colon cancer Maternal Grandmother        late 27's when dx   . Esophageal cancer Neg Hx   . Stomach cancer Neg Hx   . Rectal cancer Neg Hx     ROS: no fevers or chills, productive cough, hemoptysis, dysphasia, odynophagia, melena, hematochezia, dysuria, hematuria, rash, seizure activity, orthopnea, PND, pedal edema, claudication. Remaining systems are negative.  Physical Exam: Well-developed well-nourished in no acute distress.  Skin is warm and dry.  HEENT is normal.  Neck is supple.  Chest is  clear to auscultation with normal expansion.  Cardiovascular exam is regular rate and rhythm.  Abdominal exam nontender or distended. No masses palpated. Extremities show no edema. neuro grossly intact  ECG-sinus rhythm at a rate of 74, no ST changes.  Personally reviewed  A/P  1 coronary artery disease-patient has not had recurrent chest pain.  Continue medical therapy with aspirin and statin.  2 hypertension-blood pressure elevated; increase losartan to 100 mg daily..  Check potassium and renal function in 1 week.  3 hyperlipidemia-continue statin.  Check lipids and liver.  Kirk Ruths,  MD

## 2019-02-27 DIAGNOSIS — G4733 Obstructive sleep apnea (adult) (pediatric): Secondary | ICD-10-CM | POA: Diagnosis not present

## 2019-03-08 ENCOUNTER — Encounter: Payer: Self-pay | Admitting: Cardiology

## 2019-03-08 ENCOUNTER — Other Ambulatory Visit: Payer: Self-pay

## 2019-03-08 ENCOUNTER — Ambulatory Visit: Payer: Medicare HMO | Admitting: Cardiology

## 2019-03-08 VITALS — BP 152/70 | HR 74 | Ht 75.0 in | Wt 269.0 lb

## 2019-03-08 DIAGNOSIS — E7800 Pure hypercholesterolemia, unspecified: Secondary | ICD-10-CM

## 2019-03-08 DIAGNOSIS — I10 Essential (primary) hypertension: Secondary | ICD-10-CM | POA: Diagnosis not present

## 2019-03-08 DIAGNOSIS — I2510 Atherosclerotic heart disease of native coronary artery without angina pectoris: Secondary | ICD-10-CM

## 2019-03-08 MED ORDER — LOSARTAN POTASSIUM 100 MG PO TABS: 100.0000 mg | ORAL_TABLET | Freq: Every day | ORAL | 3 refills | Status: DC

## 2019-03-08 NOTE — Patient Instructions (Signed)
Medication Instructions:  INCREASE LOSARTAN TO 100 MG ONCE DAILY= 4 OF THE 25 MG TABLETS ONCE DAILY  *If you need a refill on your cardiac medications before your next appointment, please call your pharmacy*  Lab Work: Your physician recommends that you return for lab work in: [place]  If you have labs (blood work) drawn today and your tests are completely normal, you will receive your results only by: Marland Kitchen MyChart Message (if you have MyChart) OR . A paper copy in the mail If you have any lab test that is abnormal or we need to change your treatment, we will call you to review the results.  Follow-Up: At [facility], you and your health needs are our priority.  As part of our continuing mission to provide you with exceptional heart care, we have created designated Provider Care Teams.  These Care Teams include your primary Cardiologist (physician) and Advanced Practice Providers (APPs -  Physician Assistants and Nurse Practitioners) who all work together to provide you with the care you need, when you need it.  Your next appointment:   12 month(s)  The format for your next appointment:   Either In Person or Virtual  Provider:   You may see Kirk Ruths, MD or one of the following Advanced Practice Providers on your designated Care Team:    Kerin Ransom, PA-C  [place], [place]  Coletta Memos, [place]

## 2019-03-10 ENCOUNTER — Ambulatory Visit: Payer: Medicare HMO | Attending: Internal Medicine

## 2019-03-10 DIAGNOSIS — Z23 Encounter for immunization: Secondary | ICD-10-CM | POA: Insufficient documentation

## 2019-03-10 NOTE — Progress Notes (Signed)
   Covid-19 Vaccination Clinic  Name:  Jeremy Woodward    MRN: [id number] DOB: 1945/09/25  03/10/2019  Mr. Warters was observed post Covid-19 immunization for 15 minutes without incidence. He was provided with Vaccine Information Sheet and instruction to access the V-Safe system.   Mr. Schrom was instructed to call 911 with any severe reactions post vaccine: Marland Kitchen Difficulty breathing  . Swelling of your face and throat  . A fast heartbeat  . A bad rash all over your body  . Dizziness and weakness    Immunizations Administered    Name Date Dose VIS Date Route   Pfizer COVID-19 Vaccine 03/10/2019  3:37 PM 0.3 mL 01/08/2019 Intramuscular   Manufacturer: [employer]   Lot: [id number]   [place]: [id number]

## 2019-03-23 DIAGNOSIS — R200 Anesthesia of skin: Secondary | ICD-10-CM | POA: Diagnosis not present

## 2019-03-23 DIAGNOSIS — E538 Deficiency of other specified B group vitamins: Secondary | ICD-10-CM | POA: Diagnosis not present

## 2019-03-23 DIAGNOSIS — E7800 Pure hypercholesterolemia, unspecified: Secondary | ICD-10-CM | POA: Diagnosis not present

## 2019-03-23 DIAGNOSIS — R202 Paresthesia of skin: Secondary | ICD-10-CM | POA: Diagnosis not present

## 2019-03-24 LAB — LIPID PANEL
Chol/HDL Ratio: 2.4 ratio (ref 0.0–5.0)
Cholesterol, Total: 105 mg/dL (ref 100–199)
HDL: 43 mg/dL (ref >39)
LDL Chol Calc (NIH): 35 mg/dL (ref 0–99)
Triglycerides: 165 mg/dL — ABNORMAL HIGH (ref 0–149)
VLDL Cholesterol Cal: 27 mg/dL (ref 5–40)

## 2019-03-24 LAB — COMPREHENSIVE METABOLIC PANEL
ALT: 26 IU/L (ref 0–44)
AST: 25 IU/L (ref 0–40)
Albumin/Globulin Ratio: 2.3 — ABNORMAL HIGH (ref 1.2–2.2)
Albumin: 4.6 g/dL (ref 3.7–4.7)
Alkaline Phosphatase: 52 IU/L (ref 39–117)
BUN/Creatinine Ratio: 11 (ref 10–24)
BUN: 14 mg/dL (ref 8–27)
Bilirubin Total: 0.5 mg/dL (ref 0.0–1.2)
CO2: 20 mmol/L (ref 20–29)
Calcium: 9.8 mg/dL (ref 8.6–10.2)
Chloride: 105 mmol/L (ref 96–106)
Creatinine, Ser: 1.22 mg/dL (ref 0.76–1.27)
GFR calc Af Amer: 67 mL/min/{1.73_m2} (ref >59)
GFR calc non Af Amer: 58 mL/min/{1.73_m2} — ABNORMAL LOW (ref >59)
Globulin, Total: 2 g/dL (ref 1.5–4.5)
Glucose: 101 mg/dL — ABNORMAL HIGH (ref 65–99)
Potassium: 5.1 mmol/L (ref 3.5–5.2)
Sodium: 140 mmol/L (ref 134–144)
Total Protein: 6.6 g/dL (ref 6.0–8.5)

## 2019-03-24 LAB — VITAMIN B12: Vitamin B-12: 415 pg/mL (ref 200–1100)

## 2019-03-28 DIAGNOSIS — G4733 Obstructive sleep apnea (adult) (pediatric): Secondary | ICD-10-CM | POA: Diagnosis not present

## 2019-04-08 ENCOUNTER — Ambulatory Visit (INDEPENDENT_AMBULATORY_CARE_PROVIDER_SITE_OTHER): Payer: Medicare HMO | Admitting: Family Medicine

## 2019-04-08 ENCOUNTER — Encounter: Payer: Self-pay | Admitting: Family Medicine

## 2019-04-08 VITALS — BP 119/49 | HR 70 | Ht 75.0 in | Wt 274.0 lb

## 2019-04-08 DIAGNOSIS — G8929 Other chronic pain: Secondary | ICD-10-CM

## 2019-04-08 DIAGNOSIS — R7303 Prediabetes: Secondary | ICD-10-CM | POA: Diagnosis not present

## 2019-04-08 DIAGNOSIS — R7301 Impaired fasting glucose: Secondary | ICD-10-CM | POA: Insufficient documentation

## 2019-04-08 DIAGNOSIS — M545 Low back pain, unspecified: Secondary | ICD-10-CM

## 2019-04-08 DIAGNOSIS — I10 Essential (primary) hypertension: Secondary | ICD-10-CM | POA: Diagnosis not present

## 2019-04-08 DIAGNOSIS — R202 Paresthesia of skin: Secondary | ICD-10-CM | POA: Diagnosis not present

## 2019-04-08 DIAGNOSIS — Z6832 Body mass index (BMI) 32.0-32.9, adult: Secondary | ICD-10-CM | POA: Insufficient documentation

## 2019-04-08 DIAGNOSIS — E538 Deficiency of other specified B group vitamins: Secondary | ICD-10-CM

## 2019-04-08 DIAGNOSIS — Z6831 Body mass index (BMI) 31.0-31.9, adult: Secondary | ICD-10-CM | POA: Insufficient documentation

## 2019-04-08 DIAGNOSIS — R200 Anesthesia of skin: Secondary | ICD-10-CM | POA: Diagnosis not present

## 2019-04-08 DIAGNOSIS — I2510 Atherosclerotic heart disease of native coronary artery without angina pectoris: Secondary | ICD-10-CM

## 2019-04-08 DIAGNOSIS — Z6834 Body mass index (BMI) 34.0-34.9, adult: Secondary | ICD-10-CM

## 2019-04-08 LAB — POCT GLYCOSYLATED HEMOGLOBIN (HGB A1C): Hemoglobin A1C: 5.8 % — AB (ref 4.0–5.6)

## 2019-04-08 MED ORDER — VICTOZA 18 MG/3ML ~~LOC~~ SOPN: PEN_INJECTOR | SUBCUTANEOUS | 2 refills | Status: DC

## 2019-04-08 NOTE — Assessment & Plan Note (Signed)
We discussed the importance of working on core exercises.  Reviewed some of those exercises in the office visit today in addition to providing a handout.  If he still having persistent problems then let us know and we can do additional work-up including imaging and possible formal physical therapy.

## 2019-04-08 NOTE — Assessment & Plan Note (Signed)
I would really love to see him lose about 10 pounds over the next year with an ultimate goal of losing about 40 to 50 pounds.  I think this would make a big difference in his blood pressures.  I do not think he is a candidate for stimulants any longer as he is now 28 and I think it is too high risk.  But we did discuss alternative options we could see if his insurance might pay for Saxenda, Victoza, or Metformin.

## 2019-04-08 NOTE — Assessment & Plan Note (Signed)
A1c is still in the prediabetes range.  Just encouraged him to continue work on healthy diet regular exercise and weight loss to prevent him from eventually becoming diabetic.

## 2019-04-08 NOTE — Assessment & Plan Note (Signed)
Following with Dr. Stanford Breed yearly.

## 2019-04-08 NOTE — Patient Instructions (Addendum)
Recommend continue the B12 for an additional 3 months and then okay to discontinue at that point.  Since you are still experiencing some numbness and tingling in your great toes we could consider further work-up with nerve conduction studies to see if you could have a pinched nerve in your legs or back that could be causing your symptoms or if it may just be a neuropathy of unknown cause.  Core Strength Exercises  Core exercises help to build strength in the muscles between your ribs and your hips (abdominal muscles). These muscles help to support your body and keep your spine stable. It is important to maintain strength in your core to prevent injury and pain. Some activities, such as yoga and Pilates, can help to strengthen core muscles. You can also strengthen core muscles with exercises at home. It is important to talk to your health care provider before you start a new exercise routine. What are the benefits of core strength exercises? Core strength exercises can:  Reduce back pain.  Help to rebuild strength after a back or spine injury.  Help to prevent injury during physical activity, especially injuries to the back and knees. How to do core strength exercises Repeat these exercises 10-15 times, or until you are tired. Do exercises exactly as told by your health care provider and adjust them as directed. It is normal to feel mild stretching, pulling, tightness, or discomfort as you do these exercises. If you feel any pain while doing these exercises, stop. If your pain continues or gets worse when doing core exercises, contact your health care provider. You may want to use a padded yoga or exercise mat for strength exercises that are done on the floor. Bridging  1. Lie on your back on a firm surface with your knees bent and your feet flat on the floor. 2. Raise your hips so that your knees, hips, and shoulders form a straight line together. Keep your abdominal muscles tight. 3. Hold  this position for 3-5 seconds. 4. Slowly lower your hips to the starting position. 5. Let your muscles relax completely between repetitions. Single-leg bridge 1. Lie on your back on a firm surface with your knees bent and your feet flat on the floor. 2. Raise your hips so that your knees, hips, and shoulders form a straight line together. Keep your abdominal muscles tight. 3. Lift one foot off the floor, then completely straighten that leg. 4. Hold this position for 3-5 seconds. 5. Put the straight leg back down in the bent position. 6. Slowly lower your hips to the starting position. 7. Repeat these steps using your other leg. Side bridge 1. Lie on your side with your knees bent. Prop yourself up on the elbow that is near the floor. 2. Using your abdominal muscles and your elbow that is on the floor, raise your body off the floor. Raise your hip so that your shoulder, hip, and foot form a straight line together. 3. Hold this position for 10 seconds. Keep your head and neck raised and away from your shoulder (in their normal, neutral position). Keep your abdominal muscles tight. 4. Slowly lower your hip to the starting position. 5. Repeat and try to hold this position longer, working your way up to 30 seconds. Abdominal crunch 1. Lie on your back on a firm surface. Bend your knees and keep your feet flat on the floor. 2. Cross your arms over your chest. 3. Without bending your neck, tip your chin slightly toward  your chest. 4. Tighten your abdominal muscles as you lift your chest just high enough to lift your shoulder blades off of the floor. Do not hold your breath. You can do this with short lifts or long lifts. 5. Slowly return to the starting position. Bird dog 1. Get on your hands and knees, with your legs shoulder-width apart and your arms under your shoulders. Keep your back straight. 2. Tighten your abdominal muscles. 3. Raise one of your legs off the floor and straighten it. Try to  keep it parallel to the floor. 4. Slowly lower your leg to the starting position. 5. Raise one of your arms off the floor and straighten it. Try to keep it parallel to the floor. 6. Slowly lower your arm to the starting position. 7. Repeat with the other arm and leg. If possible, try raising a leg and arm at the same time, on opposite sides of the body. For example, raise your left hand and your right leg. Plank 1. Lie on your belly. 2. Prop up your body onto your forearms and your feet, keeping your legs straight. Your body should make a straight line between your shoulders and feet. 3. Hold this position for 10 seconds while keeping your abdominal muscles tight. 4. Lower your body to the starting position. 5. Repeat and try to hold this position longer, working your way up to 30 seconds. Cross-core strengthening 1. Stand with your feet shoulder-width apart. 2. Hold a ball out in front of you. Keep your arms straight. 3. Tighten your abdominal muscles and slowly rotate at your waist from side to side. Keep your feet flat. 4. Once you are comfortable, try repeating this exercise with a heavier ball. Top core strengthening 1. Stand about 18 inches (46 cm) in front of a wall, with your back to the wall. 2. Keep your feet flat and shoulder-width apart. 3. Tighten your abdominal muscles. 4. Bend your hips and knees. 5. Slowly reach between your legs to touch the wall behind you. 6. Slowly stand back up. 7. Raise your arms over your head and reach behind you. 8. Return to the starting position. General tips  Do not do any exercises that cause pain. If you have pain while exercising, talk to your health care provider.  Always stretch before and after doing these exercises. This can help prevent injury.  Maintain a healthy weight. Ask your health care provider what weight is healthy for you. Contact a health care provider if:  You have back pain that gets worse or does not go away.  You  feel pain while doing core strength exercises. Get help right away if:  You have severe pain that does not get better with medicine. Summary  Core exercises help to build strength in the muscles between your ribs and your waist.  Core muscles help to support your body and keep your spine stable.  Some activities, such as yoga and Pilates, can help to strengthen core muscles.  Core strength exercises can help back pain and can prevent injury.  If you feel any pain while doing core strength exercises, stop. This information is not intended to replace advice given to you by your health care provider. Make sure you discuss any questions you have with your health care provider. Document Revised: 05/06/2018 Document Reviewed: 06/05/2016 Elsevier Patient Education  [employer].

## 2019-04-08 NOTE — Assessment & Plan Note (Signed)
Pressure improved.  Discussed option of adding a low-dose beta-blocker to his regimen.  He still getting a little bit of dizziness with recent increase of the losartan so we will need to keep an eye on this.

## 2019-04-08 NOTE — Assessment & Plan Note (Signed)
Much improved with oral supplementation.  Recommend continue the B12 for an additional 3 months and then okay to discontinue at that point.

## 2019-04-08 NOTE — Assessment & Plan Note (Signed)
Improved with B12 supplementation but not completely resolved.  Could be related to some of his back problems or could be an idiopathic peripheral neuropathy.  Could consider nerve conduction studies for further evaluation.

## 2019-04-08 NOTE — Progress Notes (Signed)
Established Patient Office Visit  Subjective:  Patient ID: Jeremy Woodward, male    DOB: [birth year]  Age: 74 y.o. MRN: [id number]  CC:  Chief Complaint  Patient presents with  . Hypertension    HPI ASKIA ZENK presents for   Hypertension- Pt denies chest pain, SOB, dizziness, or heart palpitations.  Taking meds as directed w/o problems.  Denies medication side effects.  Audiology recently increase his losartan to 100 mg about a month ago.  Complains that he does occasionally feel a little dizzy when he stands up too quickly.  If he just waits a minute it usually passes but he has noticed this since increasing his losartan.  Pulse has remained in the 70s.  He has started some exercise with a stairstepper at home and has been getting out in the yard a little bit more.  B12 deficiency-he has been taking an oral supplement and just went to the lab 2 weeks ago to have it rechecked.  His B12 was up to 415 which is absolutely fantastic it was 278 4 months ago.  He complains of back pain when out doing yard work.  He says he will notice it will start to slump forward because the muscles in his back are tightening and sometimes will get pain radiating down into his thighs bilaterally.  He says a half to go inside and lay flat on the floor.  He says it is painful but once the muscles release in his back he feels better.  He says his foot pain is also better since he has been taking the B12 but still getting a little bit of numbness and tingling over his great toes.  He would also really like to lose weight.  He is taken phentermine in the past and did really well with it.  He would like to try something else if it is available.  Impaired fasting glucose-we have actually not checked his A1c in quite some time.    Past Medical History:  Diagnosis Date  . Arthritis   . BPH (benign prostatic hypertrophy)   . CAD (coronary artery disease) 2010   Blockage in artery/ widow maker  . CHF  (congestive heart failure) ([place])   . Depression   . Diarrhea   . Hyperlipidemia   . Hypertension   . Obesity   . OSA on CPAP   . Pleurisy   . Sleep apnea     Past Surgical History:  Procedure Laterality Date  . LEFT AND RIGHT HEART CATHETERIZATION WITH CORONARY ANGIOGRAM N/A 03/05/2011   Procedure: LEFT AND RIGHT HEART CATHETERIZATION WITH CORONARY ANGIOGRAM;  Surgeon: Larey Dresser, MD;  Location: [facility] CATH LAB;  Service: Cardiovascular;  Laterality: N/A;  . [place]   right with drainage utilizing VAT for empyema  . TONSILLECTOMY      Family History  Problem Relation Age of Onset  . Cancer Father 53       liver and lung  . Heart disease Mother   . Ulcers Mother   . Hypertension Mother   . Aneurysm Mother   . Hyperlipidemia Sister   . Hypertension Sister   . Colon cancer Maternal Grandmother        late 11's when dx   . Esophageal cancer Neg Hx   . Stomach cancer Neg Hx   . Rectal cancer Neg Hx     Social History   Socioeconomic History  . Marital status: Married    Spouse  name: Not on file  . Number of children: 1  . Years of education: Not on file  . Highest education level: Not on file  Occupational History  . Occupation: Retired  Tobacco Use  . Smoking status: Never Smoker  . Smokeless tobacco: Never Used  . Tobacco comment: cigars  Substance and Sexual Activity  . Alcohol use: No  . Drug use: No  . Sexual activity: Not on file  Other Topics Concern  . Not on file  Social History Narrative  . Not on file   Social Determinants of Health   Financial Resource Strain:   . Difficulty of Paying Living Expenses:   Food Insecurity:   . Worried About Charity fundraiser in the Last Year:   . Arboriculturist in the Last Year:   Transportation Needs:   . Film/video editor (Medical):   Marland Kitchen Lack of Transportation (Non-Medical):   Physical Activity:   . Days of Exercise per Week:   . Minutes of Exercise per Session:   Stress:   .  Feeling of Stress :   Social Connections:   . Frequency of Communication with Friends and Family:   . Frequency of Social Gatherings with Friends and Family:   . Attends Religious Services:   . Active Member of Clubs or Organizations:   . Attends Archivist Meetings:   Marland Kitchen Marital Status:   Intimate Partner Violence:   . Fear of Current or Ex-Partner:   . Emotionally Abused:   Marland Kitchen Physically Abused:   . Sexually Abused:     Outpatient Medications Prior to Visit  Medication Sig Dispense Refill  . ACIDOPHILUS LACTOBACILLUS PO Take by mouth.    . Ascorbic Acid (VITAMIN C) 1000 MG tablet     . aspirin 81 MG tablet Take 81 mg by mouth daily.      . cholecalciferol (VITAMIN D3) 25 MCG (1000 UNIT) tablet     . Cyanocobalamin (VITAMIN B 12 PO) Take 1,000 mcg by mouth daily.    . diclofenac Sodium (VOLTAREN) 1 % GEL diclofenac 1 % topical gel  APPLY 2 GRAMS TO THE AFFECTED AREA(S) BY TOPICAL ROUTE 4 TIMES PER DAY    . diphenoxylate-atropine (LOMOTIL) 2.5-0.025 MG tablet Take 1 tablet by mouth 4 (four) times daily as needed for diarrhea or loose stools.    . Ibuprofen (ADVIL PO) Take by mouth as needed (pain).    Marland Kitchen losartan (COZAAR) 100 MG tablet Take 1 tablet (100 mg total) by mouth daily. 90 tablet 3  . Misc. Devices MISC CPAP machine patient preference  Autopap settings of 4-20 cm H20.    . Omega-3 Fatty Acids (FISH OIL) 1000 MG CAPS Take by mouth.    . pantoprazole (PROTONIX) 40 MG tablet TAKE 1 TABLET (40 MG TOTAL) BY MOUTH DAILY. 30 tablet 6  . rosuvastatin (CRESTOR) 40 MG tablet TAKE ONE TABLET BY MOUTH DAILY 90 tablet 1  . triamcinolone cream (KENALOG) 0.1 % Apply to affected areas twice a day for up to two weeks. 45 g prn  . zolpidem (AMBIEN) 10 MG tablet TAKE ONE TABLET BY MOUTH EVERY NIGHT AT BEDTIME AS NEEDED FOR SLEEP 90 tablet 0   No facility-administered medications prior to visit.    No Known Allergies  ROS Review of Systems    Objective:    Physical Exam   Constitutional: He is oriented to person, place, and time. He appears well-developed and well-nourished.  HENT:  Head: Normocephalic and  atraumatic.  Right Ear: External ear normal.  Left Ear: External ear normal.  Eyes: Conjunctivae are normal.  Cardiovascular: Normal rate, regular rhythm and normal heart sounds.  Radial pulse 2+ bilaterally  Pulmonary/Chest: Effort normal and breath sounds normal.  Musculoskeletal:     Cervical back: Neck supple.  Neurological: He is alert and oriented to person, place, and time.  Skin: Skin is warm and dry.  Psychiatric: He has a normal mood and affect. His behavior is normal.    BP (!) 119/49   Pulse 70   Ht 6\' 3"  (1.905 m)   Wt 274 lb (124.3 kg)   SpO2 100%   BMI 34.25 kg/m  Wt Readings from Last 3 Encounters:  04/08/19 274 lb (124.3 kg)  03/08/19 269 lb (122 kg)  12/09/18 273 lb (123.8 kg)     There are no preventive care reminders to display for this patient.  There are no preventive care reminders to display for this patient.  Lab Results  Component Value Date   TSH 3.310 03/06/2018   Lab Results  Component Value Date   WBC 6.7 11/11/2018   HGB 14.6 11/11/2018   HCT 43.1 11/11/2018   MCV 88.1 11/11/2018   PLT 226 11/11/2018   Lab Results  Component Value Date   NA 140 03/23/2019   K 5.1 03/23/2019   CO2 20 03/23/2019   GLUCOSE 101 (H) 03/23/2019   BUN 14 03/23/2019   CREATININE 1.22 03/23/2019   BILITOT 0.5 03/23/2019   ALKPHOS 52 03/23/2019   AST 25 03/23/2019   ALT 26 03/23/2019   PROT 6.6 03/23/2019   ALBUMIN 4.6 03/23/2019   CALCIUM 9.8 03/23/2019   GFR 55.61 (L) 03/01/2011   Lab Results  Component Value Date   CHOL 105 03/23/2019   Lab Results  Component Value Date   HDL 43 03/23/2019   Lab Results  Component Value Date   LDLCALC 35 03/23/2019   Lab Results  Component Value Date   TRIG 165 (H) 03/23/2019   Lab Results  Component Value Date   CHOLHDL 2.4 03/23/2019   Lab Results   Component Value Date   HGBA1C 5.8 (A) 04/08/2019      Assessment & Plan:   Problem List Items Addressed This Visit      Cardiovascular and Mediastinum   Essential hypertension, benign - Primary (Chronic)    Pressure improved.  Discussed option of adding a low-dose beta-blocker to his regimen.  He still getting a little bit of dizziness with recent increase of the losartan so we will need to keep an eye on this.      CAD, NATIVE VESSEL    Following with Dr. Stanford Breed yearly.        Endocrine   IFG (impaired fasting glucose)    A1c is still in the prediabetes range.  Just encouraged him to continue work on healthy diet regular exercise and weight loss to prevent him from eventually becoming diabetic.        Other   Numbness and tingling of foot    Improved with B12 supplementation but not completely resolved.  Could be related to some of his back problems or could be an idiopathic peripheral neuropathy.  Could consider nerve conduction studies for further evaluation.      Low back pain    We discussed the importance of working on core exercises.  Reviewed some of those exercises in the office visit today in addition to providing a handout.  If  he still having persistent problems then let us know and we can do additional work-up including imaging and possible formal physical therapy.      BMI 34.0-34.9,adult    I would really love to see him lose about 10 pounds over the next year with an ultimate goal of losing about 40 to 50 pounds.  I think this would make a big difference in his blood pressures.  I do not think he is a candidate for stimulants any longer as he is now 10 and I think it is too high risk.  But we did discuss alternative options we could see if his insurance might pay for Saxenda, Victoza, or Metformin.      B12 deficiency    Much improved with oral supplementation.  Recommend continue the B12 for an additional 3 months and then okay to discontinue at that  point.         Meds ordered this encounter  Medications  . liraglutide (VICTOZA) 18 MG/3ML SOPN    Sig: Inject 0.1 mLs (0.6 mg total) into the skin daily for 7 days, THEN 0.2 mLs (1.2 mg total) daily for 7 days, THEN 0.3 mLs (1.8 mg total) daily for 16 days.    Dispense:  6.9 mL    Refill:  2    Follow-up: Return in about 3 months (around 07/09/2019) for Hypertension.    Beatrice Lecher, MD

## 2019-04-11 ENCOUNTER — Encounter: Payer: Self-pay | Admitting: Family Medicine

## 2019-04-20 ENCOUNTER — Other Ambulatory Visit: Payer: Self-pay | Admitting: Family Medicine

## 2019-04-27 DIAGNOSIS — G4733 Obstructive sleep apnea (adult) (pediatric): Secondary | ICD-10-CM | POA: Diagnosis not present

## 2019-05-28 DIAGNOSIS — G4733 Obstructive sleep apnea (adult) (pediatric): Secondary | ICD-10-CM | POA: Diagnosis not present

## 2019-06-27 DIAGNOSIS — G4733 Obstructive sleep apnea (adult) (pediatric): Secondary | ICD-10-CM | POA: Diagnosis not present

## 2019-07-12 ENCOUNTER — Other Ambulatory Visit: Payer: Self-pay

## 2019-07-12 ENCOUNTER — Ambulatory Visit (INDEPENDENT_AMBULATORY_CARE_PROVIDER_SITE_OTHER): Payer: Medicare HMO | Admitting: Family Medicine

## 2019-07-12 ENCOUNTER — Encounter: Payer: Self-pay | Admitting: Family Medicine

## 2019-07-12 VITALS — BP 125/55 | HR 62 | Ht 75.0 in | Wt 272.0 lb

## 2019-07-12 DIAGNOSIS — I10 Essential (primary) hypertension: Secondary | ICD-10-CM

## 2019-07-12 DIAGNOSIS — R200 Anesthesia of skin: Secondary | ICD-10-CM

## 2019-07-12 DIAGNOSIS — F4010 Social phobia, unspecified: Secondary | ICD-10-CM

## 2019-07-12 DIAGNOSIS — R69 Illness, unspecified: Secondary | ICD-10-CM | POA: Diagnosis not present

## 2019-07-12 DIAGNOSIS — R202 Paresthesia of skin: Secondary | ICD-10-CM | POA: Diagnosis not present

## 2019-07-12 DIAGNOSIS — R7301 Impaired fasting glucose: Secondary | ICD-10-CM

## 2019-07-12 DIAGNOSIS — T300 Burn of unspecified body region, unspecified degree: Secondary | ICD-10-CM | POA: Diagnosis not present

## 2019-07-12 MED ORDER — SERTRALINE HCL 50 MG PO TABS: ORAL_TABLET | ORAL | 3 refills | Status: DC

## 2019-07-12 NOTE — Assessment & Plan Note (Signed)
Social anxiety-discussed starting an SSRI to be taken daily since his symptoms seem to be fairly frequent.  Follow-up in 4 to 6 weeks to see if this is helpful and we can also adjust his dose at that time if needed.  Would not recommend relying on benzodiazepines for this.

## 2019-07-12 NOTE — Progress Notes (Signed)
Established Patient Office Visit  Subjective:  Patient ID: Jeremy Woodward, male    DOB: 25-Jan-1946  Age: 74 y.o. MRN: [id number]  CC:  Chief Complaint  Patient presents with  . Hypertension    HPI KIAH KEAY presents for   Hypertension- Pt denies chest pain, SOB, dizziness, or heart palpitations.  Taking meds as directed w/o problems.  Denies medication side effects.    He would also like to discuss something for his anxiety.  He actually struggles more with social anxiety when he gets around other people, when he goes to church, or even going into a grocery store if it becomes more crowded while he is there he will start to feel very anxious.  He said he was on medication previously but cannot remember the name of it.  He has lost 2 pound since I last saw him he was interested in starting a weight loss medication unfortunately because he now has Medicare they will not cover any of the newer weight loss medications.  We discussed maybe trying Victoza since he does have prediabetes but he is not interested in an injection.  IFG-he has cut back on soft drinks, white bread and sweets.  He really has been trying to do a little better.  He is actually lost 2 pounds since I last saw him.\  Still struggling with neuropathy in his feet.  He has been taking the B12 as he was found to have a deficiency he says he feels like it has helped some but he still gets numbness and tingling as well as areas that become very sensitive.  He also has a burn on his left side just above his hip it is been there for couple weeks and does not seem to be healing but it also is not getting worse.  He has been applying Neosporin and then covering it with a Band-Aid.  Past Medical History:  Diagnosis Date  . Arthritis   . BPH (benign prostatic hypertrophy)   . CAD (coronary artery disease) 2010   Blockage in artery/ widow maker  . CHF (congestive heart failure) ([place])   . Depression   . Diarrhea   .  Hyperlipidemia   . Hypertension   . Obesity   . OSA on CPAP   . Pleurisy   . Sleep apnea     Past Surgical History:  Procedure Laterality Date  . LEFT AND RIGHT HEART CATHETERIZATION WITH CORONARY ANGIOGRAM N/A 03/05/2011   Procedure: LEFT AND RIGHT HEART CATHETERIZATION WITH CORONARY ANGIOGRAM;  Surgeon: Larey Dresser, MD;  Location: [facility] CATH LAB;  Service: Cardiovascular;  Laterality: N/A;  . [place]   right with drainage utilizing VAT for empyema  . TONSILLECTOMY      Family History  Problem Relation Age of Onset  . Cancer Father 4       liver and lung  . Heart disease Mother   . Ulcers Mother   . Hypertension Mother   . Aneurysm Mother   . Hyperlipidemia Sister   . Hypertension Sister   . Colon cancer Maternal Grandmother        late 82's when dx   . Esophageal cancer Neg Hx   . Stomach cancer Neg Hx   . Rectal cancer Neg Hx     Social History   Socioeconomic History  . Marital status: Married    Spouse name: Not on file  . Number of children: 1  . Years of education:  Not on file  . Highest education level: Not on file  Occupational History  . Occupation: Retired  Tobacco Use  . Smoking status: Never Smoker  . Smokeless tobacco: Never Used  . Tobacco comment: cigars  Vaping Use  . Vaping Use: Never used  Substance and Sexual Activity  . Alcohol use: No  . Drug use: No  . Sexual activity: Not on file  Other Topics Concern  . Not on file  Social History Narrative  . Not on file   Social Determinants of Health   Financial Resource Strain:   . Difficulty of Paying Living Expenses:   Food Insecurity:   . Worried About Charity fundraiser in the Last Year:   . Arboriculturist in the Last Year:   Transportation Needs:   . Film/video editor (Medical):   Marland Kitchen Lack of Transportation (Non-Medical):   Physical Activity:   . Days of Exercise per Week:   . Minutes of Exercise per Session:   Stress:   . Feeling of Stress :   Social  Connections:   . Frequency of Communication with Friends and Family:   . Frequency of Social Gatherings with Friends and Family:   . Attends Religious Services:   . Active Member of Clubs or Organizations:   . Attends Archivist Meetings:   Marland Kitchen Marital Status:   Intimate Partner Violence:   . Fear of Current or Ex-Partner:   . Emotionally Abused:   Marland Kitchen Physically Abused:   . Sexually Abused:     Outpatient Medications Prior to Visit  Medication Sig Dispense Refill  . ACIDOPHILUS LACTOBACILLUS PO Take by mouth.    . Ascorbic Acid (VITAMIN C) 1000 MG tablet     . aspirin 81 MG tablet Take 81 mg by mouth daily.      . cholecalciferol (VITAMIN D3) 25 MCG (1000 UNIT) tablet     . Cyanocobalamin (VITAMIN B 12 PO) Take 1,000 mcg by mouth daily.    . diclofenac Sodium (VOLTAREN) 1 % GEL diclofenac 1 % topical gel  APPLY 2 GRAMS TO THE AFFECTED AREA(S) BY TOPICAL ROUTE 4 TIMES PER DAY    . diphenoxylate-atropine (LOMOTIL) 2.5-0.025 MG tablet Take 1 tablet by mouth 4 (four) times daily as needed for diarrhea or loose stools.    Marland Kitchen losartan (COZAAR) 100 MG tablet Take 1 tablet (100 mg total) by mouth daily. 90 tablet 3  . Misc. Devices MISC CPAP machine patient preference  Autopap settings of 4-20 cm H20.    . Omega-3 Fatty Acids (FISH OIL) 1000 MG CAPS Take by mouth.    . pantoprazole (PROTONIX) 40 MG tablet TAKE 1 TABLET (40 MG TOTAL) BY MOUTH DAILY. 30 tablet 6  . rosuvastatin (CRESTOR) 40 MG tablet TAKE ONE TABLET BY MOUTH DAILY 90 tablet 1  . triamcinolone cream (KENALOG) 0.1 % Apply to affected areas twice a day for up to two weeks. 45 g prn  . zolpidem (AMBIEN) 10 MG tablet TAKE ONE TABLET BY MOUTH EVERY NIGHT AT BEDTIME AS NEEDED FOR SLEEP 90 tablet 0  . Ibuprofen (ADVIL PO) Take by mouth as needed (pain).    Marland Kitchen liraglutide (VICTOZA) 18 MG/3ML SOPN Inject 0.1 mLs (0.6 mg total) into the skin daily for 7 days, THEN 0.2 mLs (1.2 mg total) daily for 7 days, THEN 0.3 mLs (1.8 mg total)  daily for 16 days. 6.9 mL 2   No facility-administered medications prior to visit.    No  Known Allergies  ROS Review of Systems    Objective:    Physical Exam Constitutional:      Appearance: He is well-developed.  HENT:     Head: Normocephalic and atraumatic.  Cardiovascular:     Rate and Rhythm: Normal rate and regular rhythm.     Heart sounds: Normal heart sounds.  Pulmonary:     Effort: Pulmonary effort is normal.     Breath sounds: Normal breath sounds.  Skin:    General: Skin is warm and dry.     Comments: Erythematous lesion with a little bit of peeling moist skin over the surface measuring approximately 2-1/2 cm in size.  Just a little bit of serous drainage present.  Neurological:     Mental Status: He is alert and oriented to person, place, and time.  Psychiatric:        Behavior: Behavior normal.     BP (!) 125/55   Pulse 62   Ht 6\' 3"  (1.905 m)   Wt 272 lb (123.4 kg)   SpO2 99%   BMI 34.00 kg/m  Wt Readings from Last 3 Encounters:  07/12/19 272 lb (123.4 kg)  04/08/19 274 lb (124.3 kg)  03/08/19 269 lb (122 kg)     There are no preventive care reminders to display for this patient.  There are no preventive care reminders to display for this patient.  Lab Results  Component Value Date   TSH 3.310 03/06/2018   Lab Results  Component Value Date   WBC 6.7 11/11/2018   HGB 14.6 11/11/2018   HCT 43.1 11/11/2018   MCV 88.1 11/11/2018   PLT 226 11/11/2018   Lab Results  Component Value Date   NA 140 03/23/2019   K 5.1 03/23/2019   CO2 20 03/23/2019   GLUCOSE 101 (H) 03/23/2019   BUN 14 03/23/2019   CREATININE 1.22 03/23/2019   BILITOT 0.5 03/23/2019   ALKPHOS 52 03/23/2019   AST 25 03/23/2019   ALT 26 03/23/2019   PROT 6.6 03/23/2019   ALBUMIN 4.6 03/23/2019   CALCIUM 9.8 03/23/2019   GFR 55.61 (L) 03/01/2011   Lab Results  Component Value Date   CHOL 105 03/23/2019   Lab Results  Component Value Date   HDL 43 03/23/2019    Lab Results  Component Value Date   LDLCALC 35 03/23/2019   Lab Results  Component Value Date   TRIG 165 (H) 03/23/2019   Lab Results  Component Value Date   CHOLHDL 2.4 03/23/2019   Lab Results  Component Value Date   HGBA1C 5.8 (A) 04/08/2019      Assessment & Plan:   Problem List Items Addressed This Visit      Cardiovascular and Mediastinum   Essential hypertension, benign - Primary (Chronic)    Blood pressure at goal today it looks fantastic.  Plan to follow-up in 6 months.        Endocrine   IFG (impaired fasting glucose)    He is really made some great lifestyle changes and just encouraged her to continue to work on that I like to recheck his hemoglobin A1c in September.        Other   Social anxiety disorder    Social anxiety-discussed starting an SSRI to be taken daily since his symptoms seem to be fairly frequent.  Follow-up in 4 to 6 weeks to see if this is helpful and we can also adjust his dose at that time if needed.  Would not  recommend relying on benzodiazepines for this.      Relevant Medications   sertraline (ZOLOFT) 50 MG tablet   Numbness and tingling of foot    Most consistent with neuropathy.  He gets numbness tingling and sensitivity on the bottoms of both feet its not located to a single distribution area.  Would like to get additional testing including nerve conduction study since he has had a history of chronic low back pain.      Relevant Orders   NCV with EMG(electromyography)    Other Visit Diagnoses    Burn of skin         Burn on the left side of abdomen above the hip-discontinue Neosporin and switch to Vaseline.  Keep covered if needed but try to let it breathe a little bit.  If he feels like it is not healing or getting worse or starts to drain then please let us know.  Meds ordered this encounter  Medications  . sertraline (ZOLOFT) 50 MG tablet    Sig: 1/2 tab po QD x 6 days then increase to whole tab daily.    Dispense:   30 tablet    Refill:  3    Follow-up: Return in about 4 weeks (around 08/09/2019) for New start medication.    Beatrice Lecher, MD

## 2019-07-12 NOTE — Progress Notes (Signed)
He reports that he never picked up the Victoza. He thought that it was going to be an oral medication not an injectable.

## 2019-07-12 NOTE — Assessment & Plan Note (Signed)
Most consistent with neuropathy.  He gets numbness tingling and sensitivity on the bottoms of both feet its not located to a single distribution area.  Would like to get additional testing including nerve conduction study since he has had a history of chronic low back pain.

## 2019-07-12 NOTE — Assessment & Plan Note (Signed)
He is really made some great lifestyle changes and just encouraged her to continue to work on that I like to recheck his hemoglobin A1c in September.

## 2019-07-12 NOTE — Assessment & Plan Note (Signed)
Blood pressure at goal today it looks fantastic.  Plan to follow-up in 6 months.

## 2019-07-19 ENCOUNTER — Other Ambulatory Visit: Payer: Self-pay | Admitting: Cardiology

## 2019-08-03 ENCOUNTER — Other Ambulatory Visit: Payer: Self-pay | Admitting: Family Medicine

## 2019-08-03 DIAGNOSIS — F4010 Social phobia, unspecified: Secondary | ICD-10-CM

## 2019-08-12 ENCOUNTER — Encounter: Payer: Self-pay | Admitting: Family Medicine

## 2019-08-12 ENCOUNTER — Ambulatory Visit (INDEPENDENT_AMBULATORY_CARE_PROVIDER_SITE_OTHER): Payer: Medicare HMO | Admitting: Family Medicine

## 2019-08-12 ENCOUNTER — Telehealth: Payer: Self-pay

## 2019-08-12 VITALS — BP 120/47 | HR 64 | Ht 75.0 in | Wt 262.0 lb

## 2019-08-12 DIAGNOSIS — T2102XD Burn of unspecified degree of abdominal wall, subsequent encounter: Secondary | ICD-10-CM

## 2019-08-12 DIAGNOSIS — R200 Anesthesia of skin: Secondary | ICD-10-CM

## 2019-08-12 DIAGNOSIS — R202 Paresthesia of skin: Secondary | ICD-10-CM

## 2019-08-12 DIAGNOSIS — T300 Burn of unspecified body region, unspecified degree: Secondary | ICD-10-CM

## 2019-08-12 DIAGNOSIS — F4010 Social phobia, unspecified: Secondary | ICD-10-CM

## 2019-08-12 DIAGNOSIS — R69 Illness, unspecified: Secondary | ICD-10-CM | POA: Diagnosis not present

## 2019-08-12 NOTE — Assessment & Plan Note (Signed)
He did not hear back about the nerve conduction study.  So given that to our referral coordinator today to be scheduled ASAP.  He continues to have numbness and tingling mostly on the bottoms of his feet he feels like the sensitivity is fairly normal on the top.  He does feel like his pinky toe on his right foot gets painful and numb particularly.

## 2019-08-12 NOTE — Progress Notes (Signed)
Established Patient Office Visit  Subjective:  Patient ID: Jeremy Woodward, male    DOB: September 03, 1945  Age: 74 y.o. MRN: [id number]  CC:  Chief Complaint  Patient presents with  . mood    HPI Jeremy Woodward presents for   Follow-up social anxiety disorder-when he was last here he revealed that he has significant social anxiety and at one point had been on prescription medication.  We decided to go ahead and start an SSRI.  Today is the 4-week follow-up. Tried to go to whole 50mg  tab and says it  Caused loose stools and made him feel a little more tired.  He actually feels like a half a tab is been working well.  He says he feels much less irritable and much less tearful.  He would like to just day with 25 mg dose.  He also had a burn on his hip that was not healing well and wanted to recheck that area today.  He did discontinue the Neosporin and switch to Vaseline he feels like it is actually getting a lot better.  He is walking more and cut out soda and has lost about 10 lbs thus far.    Past Medical History:  Diagnosis Date  . Arthritis   . BPH (benign prostatic hypertrophy)   . CAD (coronary artery disease) 2010   Blockage in artery/ widow maker  . CHF (congestive heart failure) ([place])   . Depression   . Diarrhea   . Hyperlipidemia   . Hypertension   . Obesity   . OSA on CPAP   . Pleurisy   . Sleep apnea     Past Surgical History:  Procedure Laterality Date  . LEFT AND RIGHT HEART CATHETERIZATION WITH CORONARY ANGIOGRAM N/A 03/05/2011   Procedure: LEFT AND RIGHT HEART CATHETERIZATION WITH CORONARY ANGIOGRAM;  Surgeon: Larey Dresser, MD;  Location: [facility] CATH LAB;  Service: Cardiovascular;  Laterality: N/A;  . [place]   right with drainage utilizing VAT for empyema  . TONSILLECTOMY      Family History  Problem Relation Age of Onset  . Cancer Father 23       liver and lung  . Heart disease Mother   . Ulcers Mother   . Hypertension Mother   . Aneurysm  Mother   . Hyperlipidemia Sister   . Hypertension Sister   . Colon cancer Maternal Grandmother        late 62's when dx   . Esophageal cancer Neg Hx   . Stomach cancer Neg Hx   . Rectal cancer Neg Hx     Social History   Socioeconomic History  . Marital status: Married    Spouse name: Not on file  . Number of children: 1  . Years of education: Not on file  . Highest education level: Not on file  Occupational History  . Occupation: Retired  Tobacco Use  . Smoking status: Never Smoker  . Smokeless tobacco: Never Used  . Tobacco comment: cigars  Vaping Use  . Vaping Use: Never used  Substance and Sexual Activity  . Alcohol use: No  . Drug use: No  . Sexual activity: Not on file  Other Topics Concern  . Not on file  Social History Narrative  . Not on file   Social Determinants of Health   Financial Resource Strain:   . Difficulty of Paying Living Expenses:   Food Insecurity:   . Worried About Charity fundraiser  in the Last Year:   . [place] in the Last Year:   Transportation Needs:   . Film/video editor (Medical):   Marland Kitchen Lack of Transportation (Non-Medical):   Physical Activity:   . Days of Exercise per Week:   . Minutes of Exercise per Session:   Stress:   . Feeling of Stress :   Social Connections:   . Frequency of Communication with Friends and Family:   . Frequency of Social Gatherings with Friends and Family:   . Attends Religious Services:   . Active Member of Clubs or Organizations:   . Attends Archivist Meetings:   Marland Kitchen Marital Status:   Intimate Partner Violence:   . Fear of Current or Ex-Partner:   . Emotionally Abused:   Marland Kitchen Physically Abused:   . Sexually Abused:     Outpatient Medications Prior to Visit  Medication Sig Dispense Refill  . ACIDOPHILUS LACTOBACILLUS PO Take by mouth.    . Ascorbic Acid (VITAMIN C) 1000 MG tablet     . aspirin 81 MG tablet Take 81 mg by mouth daily.      . cholecalciferol (VITAMIN D3) 25 MCG  (1000 UNIT) tablet     . Cyanocobalamin (VITAMIN B 12 PO) Take 1,000 mcg by mouth daily.    . diclofenac Sodium (VOLTAREN) 1 % GEL diclofenac 1 % topical gel  APPLY 2 GRAMS TO THE AFFECTED AREA(S) BY TOPICAL ROUTE 4 TIMES PER DAY    . diphenoxylate-atropine (LOMOTIL) 2.5-0.025 MG tablet Take 1 tablet by mouth 4 (four) times daily as needed for diarrhea or loose stools.    Marland Kitchen losartan (COZAAR) 100 MG tablet Take 1 tablet (100 mg total) by mouth daily. 90 tablet 3  . Misc. Devices MISC CPAP machine patient preference  Autopap settings of 4-20 cm H20.    . Omega-3 Fatty Acids (FISH OIL) 1000 MG CAPS Take by mouth.    . pantoprazole (PROTONIX) 40 MG tablet TAKE 1 TABLET (40 MG TOTAL) BY MOUTH DAILY. 30 tablet 6  . rosuvastatin (CRESTOR) 40 MG tablet TAKE ONE TABLET BY MOUTH DAILY 90 tablet 1  . sertraline (ZOLOFT) 50 MG tablet Take 1 tablet (50 mg total) by mouth daily. 90 tablet 0  . triamcinolone cream (KENALOG) 0.1 % Apply to affected areas twice a day for up to two weeks. 45 g prn  . zolpidem (AMBIEN) 10 MG tablet TAKE ONE TABLET BY MOUTH EVERY NIGHT AT BEDTIME AS NEEDED FOR SLEEP 90 tablet 0   No facility-administered medications prior to visit.    No Known Allergies  ROS Review of Systems    Objective:    Physical Exam Vitals reviewed.  Constitutional:      Appearance: He is well-developed.  HENT:     Head: Normocephalic and atraumatic.  Eyes:     Conjunctiva/sclera: Conjunctivae normal.  Cardiovascular:     Rate and Rhythm: Normal rate.  Pulmonary:     Effort: Pulmonary effort is normal.  Skin:    General: Skin is dry.     Coloration: Skin is not pale.     Comments: Left hip is healing well.  Neurological:     Mental Status: He is alert and oriented to person, place, and time.  Psychiatric:        Behavior: Behavior normal.     BP (!) 120/47   Pulse 64   Ht 6\' 3"  (1.905 m)   Wt 262 lb (118.8 kg)   SpO2 100%  BMI 32.75 kg/m  Wt Readings from Last 3  Encounters:  08/12/19 262 lb (118.8 kg)  07/12/19 272 lb (123.4 kg)  04/08/19 274 lb (124.3 kg)     There are no preventive care reminders to display for this patient.  There are no preventive care reminders to display for this patient.  Lab Results  Component Value Date   TSH 3.310 03/06/2018   Lab Results  Component Value Date   WBC 6.7 11/11/2018   HGB 14.6 11/11/2018   HCT 43.1 11/11/2018   MCV 88.1 11/11/2018   PLT 226 11/11/2018   Lab Results  Component Value Date   NA 140 03/23/2019   K 5.1 03/23/2019   CO2 20 03/23/2019   GLUCOSE 101 (H) 03/23/2019   BUN 14 03/23/2019   CREATININE 1.22 03/23/2019   BILITOT 0.5 03/23/2019   ALKPHOS 52 03/23/2019   AST 25 03/23/2019   ALT 26 03/23/2019   PROT 6.6 03/23/2019   ALBUMIN 4.6 03/23/2019   CALCIUM 9.8 03/23/2019   GFR 55.61 (L) 03/01/2011   Lab Results  Component Value Date   CHOL 105 03/23/2019   Lab Results  Component Value Date   HDL 43 03/23/2019   Lab Results  Component Value Date   LDLCALC 35 03/23/2019   Lab Results  Component Value Date   TRIG 165 (H) 03/23/2019   Lab Results  Component Value Date   CHOLHDL 2.4 03/23/2019   Lab Results  Component Value Date   HGBA1C 5.8 (A) 04/08/2019      Assessment & Plan:   Problem List Items Addressed This Visit      Other   Social anxiety disorder - Primary    Is actually been very happy and satisfied with the results of the 25 mg of sertraline so we will continue current dose.  Right now he would prefer to continue to split the 50s and that way if he feels like his mood is starting to change he could always try going up to a whole tab again.  I think that is reasonable.  Plan to follow-up in 6 months.      Numbness and tingling of foot    He did not hear back about the nerve conduction study.  So given that to our referral coordinator today to be scheduled ASAP.  He continues to have numbness and tingling mostly on the bottoms of his feet he  feels like the sensitivity is fairly normal on the top.  He does feel like his pinky toe on his right foot gets painful and numb particularly.       Other Visit Diagnoses    Burn of skin         Burn is healing well.  No orders of the defined types were placed in this encounter.   Follow-up: Return in about 11 weeks (around 10/28/2019) for Hypertension and A1 C.    Beatrice Lecher, MD

## 2019-08-12 NOTE — Assessment & Plan Note (Signed)
Is actually been very happy and satisfied with the results of the 25 mg of sertraline so we will continue current dose.  Right now he would prefer to continue to split the 50s and that way if he feels like his mood is starting to change he could always try going up to a whole tab again.  I think that is reasonable.  Plan to follow-up in 6 months.

## 2019-08-12 NOTE — Telephone Encounter (Signed)
Referral placed for NCV with EMG.

## 2019-09-07 ENCOUNTER — Other Ambulatory Visit: Payer: Self-pay | Admitting: Family Medicine

## 2019-09-08 NOTE — Telephone Encounter (Signed)
Last filled 04/21/2019 #90 with no refills Last appt 08/12/2019 [place] patient

## 2019-10-25 ENCOUNTER — Other Ambulatory Visit: Payer: Self-pay

## 2019-10-25 ENCOUNTER — Ambulatory Visit: Payer: Medicare HMO | Admitting: Diagnostic Neuroimaging

## 2019-10-25 ENCOUNTER — Encounter: Payer: Self-pay | Admitting: Diagnostic Neuroimaging

## 2019-10-25 VITALS — BP 119/64 | HR 66 | Ht 75.0 in | Wt 250.0 lb

## 2019-10-25 DIAGNOSIS — R202 Paresthesia of skin: Secondary | ICD-10-CM | POA: Diagnosis not present

## 2019-10-25 DIAGNOSIS — R208 Other disturbances of skin sensation: Secondary | ICD-10-CM

## 2019-10-25 DIAGNOSIS — R200 Anesthesia of skin: Secondary | ICD-10-CM | POA: Diagnosis not present

## 2019-10-25 NOTE — Patient Instructions (Signed)
NUMBNESS / PAIN / BURNING IN FEET (neuropathy vs pinched nerves in low back / lumbar radiculopathy) - symptoms slightly improving (since B12 replacement, improved nutrition and weight loss); monitor for now; may consider EMG/NCS (nerve testing) and MRI lumbar spine in future if symptoms progress - consider eval and treatment for athletes foot

## 2019-10-25 NOTE — Progress Notes (Signed)
[facility]  PATIENT: Jeremy Woodward DOB: Jan 11, 1946  REFERRING CLINICIAN: [name], * HISTORY FROM: patient  REASON FOR VISIT: new consult    HISTORICAL  CHIEF COMPLAINT:  Chief Complaint  Patient presents with  . Numbness, tingling in feet    rm 6 New Pt "since Jan- redness, swelling, pain, numbness, tingling toes, feet, heel; prickly feelings in lower legs"    HISTORY OF PRESENT ILLNESS:   74 year old male here for evaluation of numbness and tingling in feet.  Symptoms started in January 2021.  He describes redness, swelling, itching, tingling and burning sensation in his toes and feet.  Right leg worse than left.  Around that time he had started B12 replacement for borderline low B12 level, also improved his diet to help lose some weight.  Since that time his burning and pain in his feet has slightly improved.  Patient also has chronic low back pain radiating into the legs.  Symptoms are stable.    REVIEW OF SYSTEMS: Full 14 system review of systems performed and negative with exception of: As per HPI.  ALLERGIES: No Known Allergies  HOME MEDICATIONS: Outpatient Medications Prior to Visit  Medication Sig Dispense Refill  . ACIDOPHILUS LACTOBACILLUS PO Take by mouth.    . Ascorbic Acid (VITAMIN C) 1000 MG tablet     . aspirin 81 MG tablet Take 81 mg by mouth daily.      . cholecalciferol (VITAMIN D3) 25 MCG (1000 UNIT) tablet     . Cyanocobalamin (VITAMIN B 12 PO) Take 1,000 mcg by mouth daily.    . diclofenac Sodium (VOLTAREN) 1 % GEL diclofenac 1 % topical gel  APPLY 2 GRAMS TO THE AFFECTED AREA(S) BY TOPICAL ROUTE 4 TIMES PER DAY    . diphenoxylate-atropine (LOMOTIL) 2.5-0.025 MG tablet Take 1 tablet by mouth 4 (four) times daily as needed for diarrhea or loose stools.    Marland Kitchen losartan (COZAAR) 100 MG tablet Take 1 tablet (100 mg total) by mouth daily. 90 tablet 3  . Misc. Devices MISC CPAP machine patient preference  Autopap  settings of 4-20 cm H20.    . Omega-3 Fatty Acids (FISH OIL) 1000 MG CAPS Take by mouth.    . pantoprazole (PROTONIX) 40 MG tablet TAKE 1 TABLET (40 MG TOTAL) BY MOUTH DAILY. 30 tablet 6  . rosuvastatin (CRESTOR) 40 MG tablet TAKE ONE TABLET BY MOUTH DAILY 90 tablet 1  . sertraline (ZOLOFT) 50 MG tablet Take 1 tablet (50 mg total) by mouth daily. 90 tablet 0  . triamcinolone cream (KENALOG) 0.1 % Apply to affected areas twice a day for up to two weeks. 45 g prn  . zolpidem (AMBIEN) 10 MG tablet TAKE ONE TABLET BY MOUTH EVERY NIGHT AT BEDTIME AS NEEDED FOR SLEEP 30 tablet 0   No facility-administered medications prior to visit.    PAST MEDICAL HISTORY: Past Medical History:  Diagnosis Date  . Arthritis   . BPH (benign prostatic hypertrophy)   . CAD (coronary artery disease) 2010   Blockage in artery/ widow maker  . CHF (congestive heart failure) ([place])   . Depression   . Diarrhea   . Hyperlipidemia   . Hypertension   . Obesity   . OSA on CPAP   . Pleurisy   . Sleep apnea     PAST SURGICAL HISTORY: Past Surgical History:  Procedure Laterality Date  . LEFT AND RIGHT HEART CATHETERIZATION WITH CORONARY ANGIOGRAM N/A 03/05/2011   Procedure: LEFT AND RIGHT  HEART CATHETERIZATION WITH CORONARY ANGIOGRAM;  Surgeon: Larey Dresser, MD;  Location: [facility] CATH LAB;  Service: Cardiovascular;  Laterality: N/A;  . [place]   right with drainage utilizing VAT for empyema  . TONSILLECTOMY      FAMILY HISTORY: Family History  Problem Relation Age of Onset  . Cancer Father 52       liver and lung  . Heart disease Mother   . Ulcers Mother   . Hypertension Mother   . Aneurysm Mother   . Hyperlipidemia Sister   . Hypertension Sister   . Colon cancer Maternal Grandmother        late 75's when dx   . Esophageal cancer Neg Hx   . Stomach cancer Neg Hx   . Rectal cancer Neg Hx     SOCIAL HISTORY: Social History   Socioeconomic History  . Marital status: Married    Spouse  name: Vaughan Basta  . Number of children: 1  . Years of education: Not on file  . Highest education level: Not on file  Occupational History  . Occupation: Retired  Tobacco Use  . Smoking status: Never Smoker  . Smokeless tobacco: Never Used  . Tobacco comment: 10/25/19 no smoking at all  Vaping Use  . Vaping Use: Never used  Substance and Sexual Activity  . Alcohol use: No  . Drug use: No  . Sexual activity: Not on file  Other Topics Concern  . Not on file  Social History Narrative   Lives with wife   Caffeine- coffee 2 c, occas soda   Social Determinants of Health   Financial Resource Strain:   . Difficulty of Paying Living Expenses: Not on file  Food Insecurity:   . Worried About Charity fundraiser in the Last Year: Not on file  . Ran Out of Food in the Last Year: Not on file  Transportation Needs:   . Lack of Transportation (Medical): Not on file  . Lack of Transportation (Non-Medical): Not on file  Physical Activity:   . Days of Exercise per Week: Not on file  . Minutes of Exercise per Session: Not on file  Stress:   . Feeling of Stress : Not on file  Social Connections:   . Frequency of Communication with Friends and Family: Not on file  . Frequency of Social Gatherings with Friends and Family: Not on file  . Attends Religious Services: Not on file  . Active Member of Clubs or Organizations: Not on file  . Attends Archivist Meetings: Not on file  . Marital Status: Not on file  Intimate Partner Violence:   . Fear of Current or Ex-Partner: Not on file  . Emotionally Abused: Not on file  . Physically Abused: Not on file  . Sexually Abused: Not on file     PHYSICAL EXAM  GENERAL EXAM/CONSTITUTIONAL: Vitals:  Vitals:   10/25/19 1007  BP: 119/64  Pulse: 66  Weight: 250 lb (113.4 kg)  Height: 6\' 3"  (1.905 m)     Body mass index is 31.25 kg/m. Wt Readings from Last 3 Encounters:  10/25/19 250 lb (113.4 kg)  08/12/19 262 lb (118.8 kg)  07/12/19  272 lb (123.4 kg)     Patient is in no distress; well developed, nourished and groomed; neck is supple  CARDIOVASCULAR:  Examination of carotid arteries is normal; no carotid bruits  Regular rate and rhythm, no murmurs  Examination of peripheral vascular system by observation and palpation  is normal  EYES:  Ophthalmoscopic exam of optic discs and posterior segments is normal; no papilledema or hemorrhages  No exam data present  MUSCULOSKELETAL:  Gait, strength, tone, movements noted in Neurologic exam below  NEUROLOGIC: MENTAL STATUS:  No flowsheet data found.  awake, alert, oriented to person, place and time  recent and remote memory intact  normal attention and concentration  language fluent, comprehension intact, naming intact  fund of knowledge appropriate  CRANIAL NERVE:   2nd - no papilledema on fundoscopic exam  2nd, 3rd, 4th, 6th - pupils equal and reactive to light, visual fields full to confrontation, extraocular muscles intact, no nystagmus  5th - facial sensation symmetric  7th - facial strength symmetric  8th - hearing intact  9th - palate elevates symmetrically, uvula midline  11th - shoulder shrug symmetric  12th - tongue protrusion midline  MOTOR:   normal bulk and tone, full strength in the BUE, BLE  SENSORY:   normal and symmetric to light touch; DECR TEMP IN FEET; INCREASED PP SENSITIVITY IN FEET  DRY CRACKED SKIN IN TOES  AND DISTAL FEET  COORDINATION:   finger-nose-finger, fine finger movements normal  REFLEXES:   deep tendon reflexes --> BUE TRACE; BLE ABSENT  GAIT/STATION:   narrow based gait     DIAGNOSTIC DATA (LABS, IMAGING, TESTING) - I reviewed patient records, labs, notes, testing and imaging myself where available.  Lab Results  Component Value Date   WBC 6.7 11/11/2018   HGB 14.6 11/11/2018   HCT 43.1 11/11/2018   MCV 88.1 11/11/2018   PLT 226 11/11/2018      Component Value Date/Time   NA 140  03/23/2019 1037   K 5.1 03/23/2019 1037   CL 105 03/23/2019 1037   CO2 20 03/23/2019 1037   GLUCOSE 101 (H) 03/23/2019 1037   GLUCOSE 105 (H) 11/11/2018 0914   BUN 14 03/23/2019 1037   CREATININE 1.22 03/23/2019 1037   CREATININE 1.25 (H) 11/11/2018 0914   CALCIUM 9.8 03/23/2019 1037   PROT 6.6 03/23/2019 1037   ALBUMIN 4.6 03/23/2019 1037   AST 25 03/23/2019 1037   ALT 26 03/23/2019 1037   ALKPHOS 52 03/23/2019 1037   BILITOT 0.5 03/23/2019 1037   GFRNONAA 58 (L) 03/23/2019 1037   GFRNONAA 56 (L) 11/11/2018 0914   GFRAA 67 03/23/2019 1037   GFRAA 65 11/11/2018 0914   Lab Results  Component Value Date   CHOL 105 03/23/2019   HDL 43 03/23/2019   LDLCALC 35 03/23/2019   LDLDIRECT 89.4 02/13/2009   TRIG 165 (H) 03/23/2019   CHOLHDL 2.4 03/23/2019   Lab Results  Component Value Date   HGBA1C 5.8 (A) 04/08/2019   Lab Results  Component Value Date   PYPPJKDT26 712 03/23/2019   Lab Results  Component Value Date   TSH 3.310 03/06/2018       ASSESSMENT AND PLAN  74 y.o. year old male here with numbness, pain, burning in feet since early 2021.  Could represent peripheral neuropathy versus lumbar radiculopathies.  Patient had borderline low B12 level, obesity, borderline sugar readings initially which have improved.  Symptoms also improved slightly over the past few months.  Ddx: neuropathy vs lumbar radiculopathy  1. Burning sensation of feet   2. Numbness and tingling      PLAN:  NUMBNESS / PAIN / BURNING IN FEET (neuropathy vs lumbar radiculopathy) - symptoms slightly improving (since B12 replacement, improved nutrition and weight loss); monitor for now; may consider EMG/NCS and MRI  lumbar spine in future if symptoms progress - consider eval and treatment for dry skin in feet (? tinea pedis athletes foot)  Return for pending if symptoms worsen or fail to improve.    Penni Bombard, MD 3/92/6599, 78:77 AM Certified in Neurology, Neurophysiology and  Neuroimaging  [facility] [address], [place], [place] [address] [phone]

## 2019-10-26 ENCOUNTER — Encounter: Payer: Self-pay | Admitting: Family Medicine

## 2019-10-26 ENCOUNTER — Ambulatory Visit (INDEPENDENT_AMBULATORY_CARE_PROVIDER_SITE_OTHER): Payer: Medicare HMO | Admitting: Family Medicine

## 2019-10-26 VITALS — BP 117/49 | HR 62 | Ht 75.0 in | Wt 249.0 lb

## 2019-10-26 DIAGNOSIS — Z125 Encounter for screening for malignant neoplasm of prostate: Secondary | ICD-10-CM

## 2019-10-26 DIAGNOSIS — N1831 Chronic kidney disease, stage 3a: Secondary | ICD-10-CM

## 2019-10-26 DIAGNOSIS — R21 Rash and other nonspecific skin eruption: Secondary | ICD-10-CM | POA: Diagnosis not present

## 2019-10-26 DIAGNOSIS — I10 Essential (primary) hypertension: Secondary | ICD-10-CM

## 2019-10-26 DIAGNOSIS — F5101 Primary insomnia: Secondary | ICD-10-CM

## 2019-10-26 DIAGNOSIS — R7301 Impaired fasting glucose: Secondary | ICD-10-CM

## 2019-10-26 DIAGNOSIS — Z23 Encounter for immunization: Secondary | ICD-10-CM

## 2019-10-26 DIAGNOSIS — F324 Major depressive disorder, single episode, in partial remission: Secondary | ICD-10-CM

## 2019-10-26 DIAGNOSIS — R69 Illness, unspecified: Secondary | ICD-10-CM | POA: Diagnosis not present

## 2019-10-26 LAB — POCT GLYCOSYLATED HEMOGLOBIN (HGB A1C): Hemoglobin A1C: 5.5 % (ref 4.0–5.6)

## 2019-10-26 MED ORDER — ZOLPIDEM TARTRATE 10 MG PO TABS: 10.0000 mg | ORAL_TABLET | Freq: Every day | ORAL | 1 refills | Status: DC

## 2019-10-26 NOTE — Patient Instructions (Signed)
Please cut your losartan in half and check your blood pressure over the next couple of weeks.  If your top number is staying under 130 then just continue with a half a tab daily.  When you are due for refill let me know and we will send over the prescription for 50 mg instead of 100 mg.

## 2019-10-26 NOTE — Assessment & Plan Note (Signed)
Please cut your losartan in half and check your blood pressure over the next couple of weeks.  If your top number is staying under 130 then just continue with a half a tab daily.  When you are due for refill let me know and we will send over the prescription for 50 mg instead of 100 mg.

## 2019-10-26 NOTE — Assessment & Plan Note (Signed)
A1c looks phenomenal today at 5.5 in fact it is back into the normal range which is fantastic he is also lost a significant amount weight since I last saw him he is down an additional 13 pounds and continues to work on losing weight.  Congratulated him on these changes.

## 2019-10-26 NOTE — Assessment & Plan Note (Signed)
For depression and anxiety he is actually taking a half a tab of his sertraline at bedtime.  He says occasionally take a half a tab in the morning if he knows he has a lot of going on her stressful situation.  He reports this actually seems to be working really well for him.  Continue current regimen.  Follow-up in 6 months.

## 2019-10-26 NOTE — Assessment & Plan Note (Signed)
Due to recheck renal function. 

## 2019-10-26 NOTE — Assessment & Plan Note (Signed)
Will switch Ambien prescription to 90 tabs instead of 30 tabs at one time for cost reduction.

## 2019-10-26 NOTE — Progress Notes (Signed)
Established Patient Office Visit  Subjective:  Patient ID: Jeremy Woodward, male    DOB: 12-28-1945  Age: 74 y.o. MRN: [id number]  CC:  Chief Complaint  Patient presents with  . Hypertension  . ifg    HPI Jeremy Woodward presents for   Hypertension- Pt denies chest pain, SOB, dizziness, or heart palpitations.  Taking meds as directed w/o problems.  Denies medication side effects.    Impaired fasting glucose-no increased thirst or urination. No symptoms consistent with hypoglycemia.  F/U CKD 3 -due to repeat renal function.  Follow-up mood-doing really well on the sertraline he feels like it is working well and also even helping him control his appetite.  He did let me know that he saw neurology for his bilateral foot and leg pain.  In fact it is actually been grading gradually better as he has been losing weight and getting his blood glucose levels under control so they decided to hold off on the nerve conduction study for now.  He has persistently dry skin on his feet that does not seem to respond well to moisturizers he would like consultation with podiatry.  Past Medical History:  Diagnosis Date  . Arthritis   . BPH (benign prostatic hypertrophy)   . CAD (coronary artery disease) 2010   Blockage in artery/ widow maker  . CHF (congestive heart failure) ([place])   . Depression   . Diarrhea   . Hyperlipidemia   . Hypertension   . Obesity   . OSA on CPAP   . Pleurisy   . Sleep apnea     Past Surgical History:  Procedure Laterality Date  . LEFT AND RIGHT HEART CATHETERIZATION WITH CORONARY ANGIOGRAM N/A 03/05/2011   Procedure: LEFT AND RIGHT HEART CATHETERIZATION WITH CORONARY ANGIOGRAM;  Surgeon: Larey Dresser, MD;  Location: [facility] CATH LAB;  Service: Cardiovascular;  Laterality: N/A;  . [place]   right with drainage utilizing VAT for empyema  . TONSILLECTOMY      Family History  Problem Relation Age of Onset  . Cancer Father 21       liver and lung   . Heart disease Mother   . Ulcers Mother   . Hypertension Mother   . Aneurysm Mother   . Hyperlipidemia Sister   . Hypertension Sister   . Colon cancer Maternal Grandmother        late 51's when dx   . Esophageal cancer Neg Hx   . Stomach cancer Neg Hx   . Rectal cancer Neg Hx     Social History   Socioeconomic History  . Marital status: Married    Spouse name: Vaughan Basta  . Number of children: 1  . Years of education: Not on file  . Highest education level: Not on file  Occupational History  . Occupation: Retired  Tobacco Use  . Smoking status: Never Smoker  . Smokeless tobacco: Never Used  . Tobacco comment: 10/25/19 no smoking at all  Vaping Use  . Vaping Use: Never used  Substance and Sexual Activity  . Alcohol use: No  . Drug use: No  . Sexual activity: Not on file  Other Topics Concern  . Not on file  Social History Narrative   Lives with wife   Caffeine- coffee 2 c, occas soda   Social Determinants of Health   Financial Resource Strain:   . Difficulty of Paying Living Expenses: Not on file  Food Insecurity:   . Worried About Running  Out of Food in the Last Year: Not on file  . Ran Out of Food in the Last Year: Not on file  Transportation Needs:   . Lack of Transportation (Medical): Not on file  . Lack of Transportation (Non-Medical): Not on file  Physical Activity:   . Days of Exercise per Week: Not on file  . Minutes of Exercise per Session: Not on file  Stress:   . Feeling of Stress : Not on file  Social Connections:   . Frequency of Communication with Friends and Family: Not on file  . Frequency of Social Gatherings with Friends and Family: Not on file  . Attends Religious Services: Not on file  . Active Member of Clubs or Organizations: Not on file  . Attends Archivist Meetings: Not on file  . Marital Status: Not on file  Intimate Partner Violence:   . Fear of Current or Ex-Partner: Not on file  . Emotionally Abused: Not on file  .  Physically Abused: Not on file  . Sexually Abused: Not on file    Outpatient Medications Prior to Visit  Medication Sig Dispense Refill  . ACIDOPHILUS LACTOBACILLUS PO Take by mouth.    . Ascorbic Acid (VITAMIN C) 1000 MG tablet     . aspirin 81 MG tablet Take 81 mg by mouth daily.      . cholecalciferol (VITAMIN D3) 25 MCG (1000 UNIT) tablet     . Cyanocobalamin (VITAMIN B 12 PO) Take 1,000 mcg by mouth daily.    . diclofenac Sodium (VOLTAREN) 1 % GEL diclofenac 1 % topical gel  APPLY 2 GRAMS TO THE AFFECTED AREA(S) BY TOPICAL ROUTE 4 TIMES PER DAY    . diphenoxylate-atropine (LOMOTIL) 2.5-0.025 MG tablet Take 1 tablet by mouth 4 (four) times daily as needed for diarrhea or loose stools.    Marland Kitchen losartan (COZAAR) 100 MG tablet Take 1 tablet (100 mg total) by mouth daily. 90 tablet 3  . Misc. Devices MISC CPAP machine patient preference  Autopap settings of 4-20 cm H20.    . Omega-3 Fatty Acids (FISH OIL) 1000 MG CAPS Take by mouth.    . pantoprazole (PROTONIX) 40 MG tablet TAKE 1 TABLET (40 MG TOTAL) BY MOUTH DAILY. 30 tablet 6  . rosuvastatin (CRESTOR) 40 MG tablet TAKE ONE TABLET BY MOUTH DAILY 90 tablet 1  . sertraline (ZOLOFT) 50 MG tablet Take 1 tablet (50 mg total) by mouth daily. 90 tablet 0  . zolpidem (AMBIEN) 10 MG tablet TAKE ONE TABLET BY MOUTH EVERY NIGHT AT BEDTIME AS NEEDED FOR SLEEP 30 tablet 0  . triamcinolone cream (KENALOG) 0.1 % Apply to affected areas twice a day for up to two weeks. 45 g prn   No facility-administered medications prior to visit.    No Known Allergies  ROS Review of Systems    Objective:    Physical Exam Constitutional:      Appearance: He is well-developed.  HENT:     Head: Normocephalic and atraumatic.  Cardiovascular:     Rate and Rhythm: Normal rate and regular rhythm.     Heart sounds: Normal heart sounds.  Pulmonary:     Effort: Pulmonary effort is normal.     Breath sounds: Normal breath sounds.  Skin:    General: Skin is warm  and dry.  Neurological:     Mental Status: He is alert and oriented to person, place, and time.  Psychiatric:        Behavior: Behavior  normal.     BP (!) 117/49   Pulse 62   Ht 6\' 3"  (1.905 m)   Wt 249 lb (112.9 kg)   SpO2 100%   BMI 31.12 kg/m  Wt Readings from Last 3 Encounters:  10/26/19 249 lb (112.9 kg)  10/25/19 250 lb (113.4 kg)  08/12/19 262 lb (118.8 kg)     There are no preventive care reminders to display for this patient.  There are no preventive care reminders to display for this patient.  Lab Results  Component Value Date   TSH 3.310 03/06/2018   Lab Results  Component Value Date   WBC 6.7 11/11/2018   HGB 14.6 11/11/2018   HCT 43.1 11/11/2018   MCV 88.1 11/11/2018   PLT 226 11/11/2018   Lab Results  Component Value Date   NA 140 03/23/2019   K 5.1 03/23/2019   CO2 20 03/23/2019   GLUCOSE 101 (H) 03/23/2019   BUN 14 03/23/2019   CREATININE 1.22 03/23/2019   BILITOT 0.5 03/23/2019   ALKPHOS 52 03/23/2019   AST 25 03/23/2019   ALT 26 03/23/2019   PROT 6.6 03/23/2019   ALBUMIN 4.6 03/23/2019   CALCIUM 9.8 03/23/2019   GFR 55.61 (L) 03/01/2011   Lab Results  Component Value Date   CHOL 105 03/23/2019   Lab Results  Component Value Date   HDL 43 03/23/2019   Lab Results  Component Value Date   LDLCALC 35 03/23/2019   Lab Results  Component Value Date   TRIG 165 (H) 03/23/2019   Lab Results  Component Value Date   CHOLHDL 2.4 03/23/2019   Lab Results  Component Value Date   HGBA1C 5.8 (A) 04/08/2019      Assessment & Plan:   Problem List Items Addressed This Visit      Cardiovascular and Mediastinum   Essential hypertension, benign - Primary (Chronic)    Please cut your losartan in half and check your blood pressure over the next couple of weeks.  If your top number is staying under 130 then just continue with a half a tab daily.  When you are due for refill let me know and we will send over the prescription for 50 mg  instead of 100 mg.      Relevant Orders   BASIC METABOLIC PANEL WITH GFR     Endocrine   IFG (impaired fasting glucose)    A1c looks phenomenal today at 5.5 in fact it is back into the normal range which is fantastic he is also lost a significant amount weight since I last saw him he is down an additional 13 pounds and continues to work on losing weight.  Congratulated him on these changes.      Relevant Orders   POCT glycosylated hemoglobin (Hb A1C)     Genitourinary   CKD (chronic kidney disease) stage 3, GFR 30-59 ml/min    Due to recheck renal function.        Other   Insomnia (Chronic)    Will switch Ambien prescription to 90 tabs instead of 30 tabs at one time for cost reduction.      Relevant Medications   zolpidem (AMBIEN) 10 MG tablet   Depression, major, single episode, in partial remission ([place])    For depression and anxiety he is actually taking a half a tab of his sertraline at bedtime.  He says occasionally take a half a tab in the morning if he knows he has a lot of  going on her stressful situation.  He reports this actually seems to be working really well for him.  Continue current regimen.  Follow-up in 6 months.       Other Visit Diagnoses    Need for immunization against influenza       Relevant Orders   Flu Vaccine QUAD High Dose(Fluad) (Completed)   Rash of foot       Relevant Orders   Ambulatory referral to Podiatry   Screening for prostate cancer       Relevant Orders   PSA      Rash on feet-we will refer to podiatry here in our building.  I did not assess this personally myself.  Meds ordered this encounter  Medications  . zolpidem (AMBIEN) 10 MG tablet    Sig: Take 1 tablet (10 mg total) by mouth at bedtime.    Dispense:  90 tablet    Refill:  1    Not to exceed 5 additional fills before 10/18/2019    Follow-up: Return in about 6 months (around 04/24/2020) for Hypertension and A1C .    Beatrice Lecher, MD

## 2019-10-27 LAB — BASIC METABOLIC PANEL WITH GFR
BUN/Creatinine Ratio: 20 (calc) (ref 6–22)
BUN: 24 mg/dL (ref 7–25)
CO2: 24 mmol/L (ref 20–32)
Calcium: 9.8 mg/dL (ref 8.6–10.3)
Chloride: 107 mmol/L (ref 98–110)
Creat: 1.21 mg/dL — ABNORMAL HIGH (ref 0.70–1.18)
GFR, Est African American: 67 mL/min/{1.73_m2} (ref >=60)
GFR, Est Non African American: 58 mL/min/{1.73_m2} — ABNORMAL LOW (ref >=60)
Glucose, Bld: 99 mg/dL (ref 65–99)
Potassium: 4.8 mmol/L (ref 3.5–5.3)
Sodium: 140 mmol/L (ref 135–146)

## 2019-10-27 LAB — PSA: PSA: 1.7 ng/mL (ref <=4.0)

## 2019-10-28 ENCOUNTER — Ambulatory Visit: Payer: Medicare HMO | Admitting: Family Medicine

## 2019-11-05 ENCOUNTER — Other Ambulatory Visit: Payer: Self-pay | Admitting: Family Medicine

## 2019-11-05 DIAGNOSIS — F4010 Social phobia, unspecified: Secondary | ICD-10-CM

## 2019-11-08 ENCOUNTER — Other Ambulatory Visit: Payer: Self-pay

## 2019-11-08 ENCOUNTER — Ambulatory Visit: Payer: Medicare HMO | Admitting: Podiatry

## 2019-11-08 DIAGNOSIS — B353 Tinea pedis: Secondary | ICD-10-CM

## 2019-11-08 DIAGNOSIS — L84 Corns and callosities: Secondary | ICD-10-CM

## 2019-11-08 MED ORDER — TERBINAFINE HCL 250 MG PO TABS: 250.0000 mg | ORAL_TABLET | Freq: Every day | ORAL | 0 refills | Status: DC

## 2019-11-08 MED ORDER — CLOTRIMAZOLE-BETAMETHASONE 1-0.05 % EX CREA: 1.0000 "application " | TOPICAL_CREAM | Freq: Two times a day (BID) | CUTANEOUS | 3 refills | Status: DC

## 2019-11-08 NOTE — Progress Notes (Signed)
   HPI: 74 y.o. male presenting today as a new patient for evaluation of dry itchy skin to the bilateral feet.  Patient states that he has noticed this since the beginning of the year approximately 10 months now.  His feet are very dry and itching and at times erythematous.  He denies history of injury or trauma to the area.  He actually had a neurologist evaluate his nerves which were normal and no findings contributory to the patient's feet symptoms apparently.  He presents for further treatment evaluation  Past Medical History:  Diagnosis Date  . Arthritis   . BPH (benign prostatic hypertrophy)   . CAD (coronary artery disease) 2010   Blockage in artery/ widow maker  . CHF (congestive heart failure) ([place])   . Depression   . Diarrhea   . Hyperlipidemia   . Hypertension   . Obesity   . OSA on CPAP   . Pleurisy   . Sleep apnea           Physical Exam: General: The patient is alert and oriented x3 in no acute distress.  Dermatology: Xerotic hyperkeratosis with flaking of skin and mild erythema noted to the bilateral weightbearing surfaces of the feet extending to the dorsum of the feet.  No macerations noted.  No open wounds or lesions noted.  Please see above pictures There is an interdigital hyperkeratotic corn/callus noted to the medial aspect of the right fifth digit.  Vascular: Palpable pedal pulses bilaterally. No edema or erythema noted. Capillary refill within normal limits.  Neurological: Epicritic and protective threshold grossly intact bilaterally.   Musculoskeletal Exam: Range of motion within normal limits to all pedal and ankle joints bilateral. Muscle strength 5/5 in all groups bilateral.   Assessment: 1.  Tinea pedis bilateral 2.  Interdigital corn right fifth digit   Plan of Care:  1. Patient evaluated.  2.  Prescription for Lotrisone cream apply 2 times daily 3.  Prescription for Lamisil 250 mg #30 daily 4.  Interdigital corn was debrided using a chisel  blade without incident or bleeding.  Patient felt immediate relief.   5.  Return to clinic in 1 month      Edrick Kins, DPM [facility]  Dr. Edrick Kins, DPM    [address], [place] [address]                Office [phone]  Fax [phone]

## 2019-12-15 ENCOUNTER — Other Ambulatory Visit: Payer: Self-pay

## 2019-12-15 ENCOUNTER — Ambulatory Visit: Payer: Medicare HMO | Admitting: Podiatry

## 2019-12-15 DIAGNOSIS — L84 Corns and callosities: Secondary | ICD-10-CM | POA: Diagnosis not present

## 2019-12-15 DIAGNOSIS — B353 Tinea pedis: Secondary | ICD-10-CM

## 2019-12-15 NOTE — Progress Notes (Signed)
   HPI: 74 y.o. male presenting today for follow-up evaluation of tinea pedis to the bilateral feet.  Patient states that he took the oral Lamisil as prescribed and has been applying the Lotrisone cream 2 times daily.  He notices significant improvement of his skin and feet.  He is very satisfied with the results over the past month.  No new complaints at this time  Past Medical History:  Diagnosis Date  . Arthritis   . BPH (benign prostatic hypertrophy)   . CAD (coronary artery disease) 2010   Blockage in artery/ widow maker  . CHF (congestive heart failure) ([place])   . Depression   . Diarrhea   . Hyperlipidemia   . Hypertension   . Obesity   . OSA on CPAP   . Pleurisy   . Sleep apnea      Physical Exam: General: The patient is alert and oriented x3 in no acute distress.  Dermatology: Skin is warm, dry and supple bilateral lower extremities. Negative for open lesions or macerations.  There is only some very mild hyperkeratotic callused skin noted today.  Significant improvement since last visit.  Been tinea pedis appears mostly resolved  Vascular: Palpable pedal pulses bilaterally. No edema or erythema noted. Capillary refill within normal limits.  Neurological: Epicritic and protective threshold grossly intact bilaterally.   Musculoskeletal Exam: Range of motion within normal limits to all pedal and ankle joints bilateral. Muscle strength 5/5 in all groups bilateral.    Assessment: 1.  Tinea pedis bilateral-improved 2.  Interdigital corn right fifth digit-improved   Plan of Care:  1. Patient evaluated.  2.  Continue Lotrisone cream daily for 3-4 weeks 3.  Recommend OTC antifungal foot spray for prophylaxis daily 4.  Return to clinic as needed      Edrick Kins, DPM [facility]  Dr. Edrick Kins, DPM    [address], [place] [address]                Office [phone]  Fax [phone]

## 2020-01-14 ENCOUNTER — Other Ambulatory Visit: Payer: Self-pay | Admitting: Cardiology

## 2020-02-04 DIAGNOSIS — H40013 Open angle with borderline findings, low risk, bilateral: Secondary | ICD-10-CM | POA: Diagnosis not present

## 2020-02-04 DIAGNOSIS — H18513 Endothelial corneal dystrophy, bilateral: Secondary | ICD-10-CM | POA: Diagnosis not present

## 2020-02-04 DIAGNOSIS — H35371 Puckering of macula, right eye: Secondary | ICD-10-CM | POA: Diagnosis not present

## 2020-02-04 DIAGNOSIS — H2513 Age-related nuclear cataract, bilateral: Secondary | ICD-10-CM | POA: Diagnosis not present

## 2020-02-04 DIAGNOSIS — H524 Presbyopia: Secondary | ICD-10-CM | POA: Diagnosis not present

## 2020-02-08 ENCOUNTER — Encounter: Payer: Self-pay | Admitting: Family Medicine

## 2020-02-08 NOTE — Progress Notes (Signed)
error 

## 2020-02-23 DIAGNOSIS — G4733 Obstructive sleep apnea (adult) (pediatric): Secondary | ICD-10-CM | POA: Diagnosis not present

## 2020-03-13 ENCOUNTER — Other Ambulatory Visit: Payer: Self-pay | Admitting: Cardiology

## 2020-03-13 DIAGNOSIS — I10 Essential (primary) hypertension: Secondary | ICD-10-CM

## 2020-04-01 DIAGNOSIS — Z0100 Encounter for examination of eyes and vision without abnormal findings: Secondary | ICD-10-CM | POA: Diagnosis not present

## 2020-04-01 DIAGNOSIS — H524 Presbyopia: Secondary | ICD-10-CM | POA: Diagnosis not present

## 2020-04-01 DIAGNOSIS — H52209 Unspecified astigmatism, unspecified eye: Secondary | ICD-10-CM | POA: Diagnosis not present

## 2020-04-01 DIAGNOSIS — H5203 Hypermetropia, bilateral: Secondary | ICD-10-CM | POA: Diagnosis not present

## 2020-04-06 ENCOUNTER — Telehealth: Payer: Self-pay | Admitting: Neurology

## 2020-04-06 NOTE — Telephone Encounter (Signed)
Prior Authorization for Ambien submitted via covermymeds. Awaiting response. Your information has been submitted to [place] [employer] Part D. [employer] will review the request and will issue a decision, typically within 1-3 days from your submission. You can check the updated outcome later by reopening this request.  If [employer] has not responded in 1-3 days or if you have any questions about your ePA request, please contact [place] [employer] Part D at [phone]. If you think there may be a problem with your PA request, use our live chat feature at the bottom right.

## 2020-04-24 ENCOUNTER — Ambulatory Visit: Payer: Medicare HMO | Admitting: Family Medicine

## 2020-05-02 ENCOUNTER — Encounter: Payer: Self-pay | Admitting: Family Medicine

## 2020-05-02 ENCOUNTER — Ambulatory Visit (INDEPENDENT_AMBULATORY_CARE_PROVIDER_SITE_OTHER): Payer: Medicare HMO | Admitting: Family Medicine

## 2020-05-02 ENCOUNTER — Other Ambulatory Visit: Payer: Self-pay

## 2020-05-02 VITALS — BP 134/57 | HR 56 | Ht 75.0 in | Wt 254.0 lb

## 2020-05-02 DIAGNOSIS — R7301 Impaired fasting glucose: Secondary | ICD-10-CM

## 2020-05-02 DIAGNOSIS — G4733 Obstructive sleep apnea (adult) (pediatric): Secondary | ICD-10-CM | POA: Diagnosis not present

## 2020-05-02 DIAGNOSIS — G4700 Insomnia, unspecified: Secondary | ICD-10-CM | POA: Diagnosis not present

## 2020-05-02 DIAGNOSIS — N1831 Chronic kidney disease, stage 3a: Secondary | ICD-10-CM

## 2020-05-02 DIAGNOSIS — Z9989 Dependence on other enabling machines and devices: Secondary | ICD-10-CM

## 2020-05-02 DIAGNOSIS — I10 Essential (primary) hypertension: Secondary | ICD-10-CM | POA: Diagnosis not present

## 2020-05-02 LAB — POCT GLYCOSYLATED HEMOGLOBIN (HGB A1C): Hemoglobin A1C: 5.5 % (ref 4.0–5.6)

## 2020-05-02 MED ORDER — LOSARTAN POTASSIUM 50 MG PO TABS: 50.0000 mg | ORAL_TABLET | Freq: Every day | ORAL | 3 refills | Status: DC

## 2020-05-02 MED ORDER — AMBULATORY NON FORMULARY MEDICATION: 0 refills | Status: DC

## 2020-05-02 NOTE — Assessment & Plan Note (Signed)
A1c still looks really well controlled at 5.5.

## 2020-05-02 NOTE — Assessment & Plan Note (Signed)
Recheck renal function. ?

## 2020-05-02 NOTE — Assessment & Plan Note (Signed)
He currently has his machine set on [employer] through [employer].  Would like to evaluate for hypoxemia even while on the CPAP since his watch is been telling him he has been dropping into the low 80s overnight if this is accurate then we need to consider adjusting his CPAP or maybe even adding oxygen to his CPAP at night.

## 2020-05-02 NOTE — Patient Instructions (Signed)
Please work on cutting down on your dose of Ambien. Try to take a half tab some nights.

## 2020-05-02 NOTE — Progress Notes (Signed)
Established Patient Office Visit  Subjective:  Patient ID: HUBBERT LANDRIGAN, male    DOB: 07-19-1945  Age: 75 y.o. MRN: [id number]  CC:  Chief Complaint  Patient presents with  . Hypertension  . ifg    HPI FELICIA BOTH presents for   Hypertension- Pt denies chest pain, SOB, dizziness, or heart palpitations.  Taking meds as directed w/o problems.  Denies medication side effects.  Has been splitting his tab of losartan in half.  Patient brought in home blood pressure log.  Most blood pressures are in the 120s over 50s.  The highest pressure recorded 131/61.  And the lowest pressure was 104/54.  Pulse predominantly in the low 60s.  Impaired fasting glucose-no increased thirst or urination. No symptoms consistent with hypoglycemia.  F/U CKD 3 - stable.    F/U Insomnia -uses Ambien most nights to sleep.  He says he has noticed that his sleep quality has not been quite as good lately.  He is noticed on his watch that his oxygen level has been dropping down into the low 80s even though he has been using his CPAP.  His weight is up about 5 lbs. he has been exercising regularly doing some weights and some cardio.  He feels like at home he is actually lost some weight and his pant size has gone down.  Past Medical History:  Diagnosis Date  . Arthritis   . BPH (benign prostatic hypertrophy)   . CAD (coronary artery disease) 2010   Blockage in artery/ widow maker  . CHF (congestive heart failure) ([place])   . Depression   . Diarrhea   . Hyperlipidemia   . Hypertension   . Obesity   . OSA on CPAP   . Pleurisy   . Sleep apnea     Past Surgical History:  Procedure Laterality Date  . LEFT AND RIGHT HEART CATHETERIZATION WITH CORONARY ANGIOGRAM N/A 03/05/2011   Procedure: LEFT AND RIGHT HEART CATHETERIZATION WITH CORONARY ANGIOGRAM;  Surgeon: Larey Dresser, MD;  Location[facility];  Service: Cardiovascular;  Laterality: N/A;  . [place]   right with drainage utilizing  VAT for empyema  . TONSILLECTOMY      Family History  Problem Relation Age of Onset  . Cancer Father 67       liver and lung  . Heart disease Mother   . Ulcers Mother   . Hypertension Mother   . Aneurysm Mother   . Hyperlipidemia Sister   . Hypertension Sister   . Colon cancer Maternal Grandmother        late 49's when dx   . Esophageal cancer Neg Hx   . Stomach cancer Neg Hx   . Rectal cancer Neg Hx     Social History   Socioeconomic History  . Marital status: Married    Spouse name: Vaughan Basta  . Number of children: 1  . Years of education: Not on file  . Highest education level: Not on file  Occupational History  . Occupation: Retired  Tobacco Use  . Smoking status: Never Smoker  . Smokeless tobacco: Never Used  . Tobacco comment: 10/25/19 no smoking at all  Vaping Use  . Vaping Use: Never used  Substance and Sexual Activity  . Alcohol use: No  . Drug use: No  . Sexual activity: Not on file  Other Topics Concern  . Not on file  Social History Narrative   Lives with wife   Caffeine- coffee  2 c, occas soda   Social Determinants of Health   Financial Resource Strain: Not on file  Food Insecurity: Not on file  Transportation Needs: Not on file  Physical Activity: Not on file  Stress: Not on file  Social Connections: Not on file  Intimate Partner Violence: Not on file    Outpatient Medications Prior to Visit  Medication Sig Dispense Refill  . ACIDOPHILUS LACTOBACILLUS PO Take by mouth.    . Ascorbic Acid (VITAMIN C) 1000 MG tablet     . aspirin 81 MG tablet Take 81 mg by mouth daily.    . cholecalciferol (VITAMIN D3) 25 MCG (1000 UNIT) tablet     . clotrimazole-betamethasone (LOTRISONE) cream Apply 1 application topically 2 (two) times daily. 45 g 3  . Cyanocobalamin (VITAMIN B 12 PO) Take 1,000 mcg by mouth daily.    . diclofenac Sodium (VOLTAREN) 1 % GEL diclofenac 1 % topical gel  APPLY 2 GRAMS TO THE AFFECTED AREA(S) BY TOPICAL ROUTE 4 TIMES PER DAY     . diphenoxylate-atropine (LOMOTIL) 2.5-0.025 MG tablet Take 1 tablet by mouth 4 (four) times daily as needed for diarrhea or loose stools.    . Misc. Devices MISC CPAP machine patient preference  Autopap settings of 4-20 cm H20.    . Omega-3 Fatty Acids (FISH OIL) 1000 MG CAPS Take by mouth.    . pantoprazole (PROTONIX) 40 MG tablet TAKE 1 TABLET (40 MG TOTAL) BY MOUTH DAILY. 30 tablet 6  . rosuvastatin (CRESTOR) 40 MG tablet TAKE ONE TABLET BY MOUTH DAILY 90 tablet 1  . sertraline (ZOLOFT) 50 MG tablet TAKE 1 TABLET BY MOUTH EVERY DAY 90 tablet 0  . zolpidem (AMBIEN) 10 MG tablet Take 1 tablet (10 mg total) by mouth at bedtime. 90 tablet 1  . losartan (COZAAR) 100 MG tablet Take 1 tablet (100 mg total) by mouth daily. 90 tablet 3  . terbinafine (LAMISIL) 250 MG tablet Take 1 tablet (250 mg total) by mouth daily. 30 tablet 0   No facility-administered medications prior to visit.    No Known Allergies  ROS Review of Systems    Objective:    Physical Exam Constitutional:      Appearance: He is well-developed.  HENT:     Head: Normocephalic and atraumatic.  Cardiovascular:     Rate and Rhythm: Normal rate and regular rhythm.     Heart sounds: Normal heart sounds.  Pulmonary:     Effort: Pulmonary effort is normal.     Breath sounds: Normal breath sounds.  Skin:    General: Skin is warm and dry.  Neurological:     Mental Status: He is alert and oriented to person, place, and time.  Psychiatric:        Behavior: Behavior normal.     BP (!) 134/57   Pulse (!) 56   Ht 6\' 3"  (1.905 m)   Wt 254 lb (115.2 kg)   SpO2 100%   BMI 31.75 kg/m  Wt Readings from Last 3 Encounters:  05/02/20 254 lb (115.2 kg)  10/26/19 249 lb (112.9 kg)  10/25/19 250 lb (113.4 kg)     Health Maintenance Due  Topic Date Due  . COVID-19 Vaccine (3 - Pfizer risk 4-dose series) 04/07/2019    There are no preventive care reminders to display for this patient.  Lab Results  Component Value  Date   TSH 3.310 03/06/2018   Lab Results  Component Value Date   WBC 6.7 11/11/2018  HGB 14.6 11/11/2018   HCT 43.1 11/11/2018   MCV 88.1 11/11/2018   PLT 226 11/11/2018   Lab Results  Component Value Date   NA 140 10/26/2019   K 4.8 10/26/2019   CO2 24 10/26/2019   GLUCOSE 99 10/26/2019   BUN 24 10/26/2019   CREATININE 1.21 (H) 10/26/2019   BILITOT 0.5 03/23/2019   ALKPHOS 52 03/23/2019   AST 25 03/23/2019   ALT 26 03/23/2019   PROT 6.6 03/23/2019   ALBUMIN 4.6 03/23/2019   CALCIUM 9.8 10/26/2019   GFR 55.61 (L) 03/01/2011   Lab Results  Component Value Date   CHOL 105 03/23/2019   Lab Results  Component Value Date   HDL 43 03/23/2019   Lab Results  Component Value Date   LDLCALC 35 03/23/2019   Lab Results  Component Value Date   TRIG 165 (H) 03/23/2019   Lab Results  Component Value Date   CHOLHDL 2.4 03/23/2019   Lab Results  Component Value Date   HGBA1C 5.5 05/02/2020      Assessment & Plan:   Problem List Items Addressed This Visit      Cardiovascular and Mediastinum   Benign essential hypertension - Primary    Home BPs have been well controlled on the lower dose of [place].  New prescription sent to pharmacy for 50 mg.  We will get updated blood work today.  Will call with results once available.      Relevant Medications   losartan (COZAAR) 50 MG tablet   Other Relevant Orders   COMPLETE METABOLIC PANEL WITH GFR   Lipid panel     Respiratory   OSA on CPAP (Chronic)    He currently has his machine set on [employer] through [place].  Would like to evaluate for hypoxemia even while on the CPAP since his watch is been telling him he has been dropping into the low 80s overnight if this is accurate then we need to consider adjusting his CPAP or maybe even adding oxygen to his CPAP at night.      Relevant Medications   AMBULATORY NON FORMULARY MEDICATION     Endocrine   IFG (impaired fasting glucose)    A1c still looks really well  controlled at 5.5.      Relevant Orders   COMPLETE METABOLIC PANEL WITH GFR   Lipid panel   POCT glycosylated hemoglobin (Hb A1C) (Completed)     Genitourinary   CKD (chronic kidney disease) stage 3, GFR 30-59 ml/min (HCC)    Recheck renal function.        Other   Insomnia (Chronic)    Gone starting to cut back on Ambien dose if possible.       Other Visit Diagnoses    Essential hypertension       Relevant Medications   losartan (COZAAR) 50 MG tablet      Meds ordered this encounter  Medications  . losartan (COZAAR) 50 MG tablet    Sig: Take 1 tablet (50 mg total) by mouth daily.    Dispense:  90 tablet    Refill:  3  . AMBULATORY NON FORMULARY MEDICATION    Sig: Medication Name: overnight pulse oximetry.  Dx nocturnal hypoxemia. Fax to [facility]    Dispense:  1 application    Refill:  0    Follow-up: Return in about 6 months (around 11/01/2020) for Hypertension, sleep medicine.    Beatrice Lecher, MD

## 2020-05-02 NOTE — Assessment & Plan Note (Signed)
Home BPs have been well controlled on the lower dose of [place].  New prescription sent to pharmacy for 50 mg.  We will get updated blood work today.  Will call with results once available.

## 2020-05-02 NOTE — Assessment & Plan Note (Signed)
Gone starting to cut back on Ambien dose if possible.

## 2020-05-03 ENCOUNTER — Other Ambulatory Visit: Payer: Self-pay | Admitting: Family Medicine

## 2020-05-03 DIAGNOSIS — F4010 Social phobia, unspecified: Secondary | ICD-10-CM

## 2020-05-03 LAB — COMPLETE METABOLIC PANEL WITH GFR
AG Ratio: 2.2 (calc) (ref 1.0–2.5)
ALT: 28 U/L (ref 9–46)
AST: 25 U/L (ref 10–35)
Albumin: 4.3 g/dL (ref 3.6–5.1)
Alkaline phosphatase (APISO): 49 U/L (ref 35–144)
BUN/Creatinine Ratio: 17 (calc) (ref 6–22)
BUN: 21 mg/dL (ref 7–25)
CO2: 24 mmol/L (ref 20–32)
Calcium: 9.2 mg/dL (ref 8.6–10.3)
Chloride: 107 mmol/L (ref 98–110)
Creat: 1.21 mg/dL — ABNORMAL HIGH (ref 0.70–1.18)
GFR, Est African American: 67 mL/min/{1.73_m2} (ref >=60)
GFR, Est Non African American: 58 mL/min/{1.73_m2} — ABNORMAL LOW (ref >=60)
Globulin: 2 g/dL (calc) (ref 1.9–3.7)
Glucose, Bld: 92 mg/dL (ref 65–99)
Potassium: 4.2 mmol/L (ref 3.5–5.3)
Sodium: 139 mmol/L (ref 135–146)
Total Bilirubin: 0.6 mg/dL (ref 0.2–1.2)
Total Protein: 6.3 g/dL (ref 6.1–8.1)

## 2020-05-03 LAB — LIPID PANEL
Cholesterol: 118 mg/dL (ref <200)
HDL: 45 mg/dL (ref >=40)
LDL Cholesterol (Calc): 53 mg/dL (calc)
Non-HDL Cholesterol (Calc): 73 mg/dL (calc) (ref <130)
Total CHOL/HDL Ratio: 2.6 (calc) (ref <5.0)
Triglycerides: 110 mg/dL (ref <150)

## 2020-05-16 ENCOUNTER — Telehealth: Payer: Self-pay | Admitting: Family Medicine

## 2020-05-16 DIAGNOSIS — G4734 Idiopathic sleep related nonobstructive alveolar hypoventilation: Secondary | ICD-10-CM | POA: Diagnosis not present

## 2020-05-16 NOTE — Telephone Encounter (Signed)
Please call pt and see if he was wearing his CPAP during the oxygen study.  His oxygen did drop below 88% for about 9 min total but I wasn't sure if this was with his CPAP on.

## 2020-05-16 NOTE — Progress Notes (Signed)
HPI: FU coronary artery disease. History of empyema and VATS/decortication, HTN, and long-standing exertional shortness of breath. Abd ultrasound 2010 with no aneurysm. Patient reported somewhat increased exertional dyspnea as well as occasional chest pain episodes, so ETT was done in 1/13, showing 6:20 exercise with discontinuation due to profound dyspnea and some chest pain. Given very severe symptoms with ETT and progressive symptoms, LHC and RHC repeated and showed normal right and left heart filling pressure. LHC showed stable to improved coronary disease, with 40% distal LM and 40-50% proximal LAD. Since last seen,he has chronic dyspnea on exertion.  No orthopnea, PND or pedal edema.  No exertional chest pain.  No syncope.  Current Outpatient Medications  Medication Sig Dispense Refill  . ACIDOPHILUS LACTOBACILLUS PO Take by mouth.    . AMBULATORY NON FORMULARY MEDICATION Medication Name: overnight pulse oximetry.  Dx nocturnal hypoxemia. Fax to [facility] 1 application 0  . Ascorbic Acid (VITAMIN C) 1000 MG tablet     . aspirin 81 MG tablet Take 81 mg by mouth daily.    . cholecalciferol (VITAMIN D3) 25 MCG (1000 UNIT) tablet     . clotrimazole-betamethasone (LOTRISONE) cream Apply 1 application topically 2 (two) times daily. 45 g 3  . Cyanocobalamin (VITAMIN B 12 PO) Take 1,000 mcg by mouth daily.    . diclofenac Sodium (VOLTAREN) 1 % GEL diclofenac 1 % topical gel  APPLY 2 GRAMS TO THE AFFECTED AREA(S) BY TOPICAL ROUTE 4 TIMES PER DAY    . diphenoxylate-atropine (LOMOTIL) 2.5-0.025 MG tablet Take 1 tablet by mouth 4 (four) times daily as needed for diarrhea or loose stools.    Marland Kitchen losartan (COZAAR) 50 MG tablet Take 1 tablet (50 mg total) by mouth daily. 90 tablet 3  . Misc. Devices MISC CPAP machine patient preference  Autopap settings of 4-20 cm H20.    . Omega-3 Fatty Acids (FISH OIL) 1000 MG CAPS Take by mouth.    . pantoprazole (PROTONIX) 40 MG tablet TAKE 1 TABLET (40 MG TOTAL)  BY MOUTH DAILY. 30 tablet 6  . rosuvastatin (CRESTOR) 40 MG tablet TAKE ONE TABLET BY MOUTH DAILY 90 tablet 1  . sertraline (ZOLOFT) 50 MG tablet TAKE 1 TABLET BY MOUTH EVERY DAY 90 tablet 1  . zolpidem (AMBIEN) 10 MG tablet Take 1 tablet (10 mg total) by mouth at bedtime. 90 tablet 1   No current facility-administered medications for this visit.     Past Medical History:  Diagnosis Date  . Arthritis   . BPH (benign prostatic hypertrophy)   . CAD (coronary artery disease) 2010   Blockage in artery/ widow maker  . CHF (congestive heart failure) ([place])   . Depression   . Diarrhea   . Hyperlipidemia   . Hypertension   . Obesity   . OSA on CPAP   . Pleurisy   . Sleep apnea     Past Surgical History:  Procedure Laterality Date  . LEFT AND RIGHT HEART CATHETERIZATION WITH CORONARY ANGIOGRAM N/A 03/05/2011   Procedure: LEFT AND RIGHT HEART CATHETERIZATION WITH CORONARY ANGIOGRAM;  Surgeon: Larey Dresser, MD;  Location: [facility] CATH LAB;  Service: Cardiovascular;  Laterality: N/A;  . [place]   right with drainage utilizing VAT for empyema  . TONSILLECTOMY      Social History   Socioeconomic History  . Marital status: Married    Spouse name: Vaughan Basta  . Number of children: 1  . Years of education: Not on file  .  Highest education level: Not on file  Occupational History  . Occupation: Retired  Tobacco Use  . Smoking status: Never Smoker  . Smokeless tobacco: Never Used  . Tobacco comment: 10/25/19 no smoking at all  Vaping Use  . Vaping Use: Never used  Substance and Sexual Activity  . Alcohol use: No  . Drug use: No  . Sexual activity: Not on file  Other Topics Concern  . Not on file  Social History Narrative   Lives with wife   Caffeine- coffee 2 c, occas soda   Social Determinants of Health   Financial Resource Strain: Not on file  Food Insecurity: Not on file  Transportation Needs: Not on file  Physical Activity: Not on file  Stress: Not on file   Social Connections: Not on file  Intimate Partner Violence: Not on file    Family History  Problem Relation Age of Onset  . Cancer Father 74       liver and lung  . Heart disease Mother   . Ulcers Mother   . Hypertension Mother   . Aneurysm Mother   . Hyperlipidemia Sister   . Hypertension Sister   . Colon cancer Maternal Grandmother        late 49's when dx   . Esophageal cancer Neg Hx   . Stomach cancer Neg Hx   . Rectal cancer Neg Hx     ROS: no fevers or chills, productive cough, hemoptysis, dysphasia, odynophagia, melena, hematochezia, dysuria, hematuria, rash, seizure activity, orthopnea, PND, pedal edema, claudication. Remaining systems are negative.  Physical Exam: Well-developed well-nourished in no acute distress.  Skin is warm and dry.  HEENT is normal.  Neck is supple.  Chest is clear to auscultation with normal expansion.  Cardiovascular exam is regular rate and rhythm.  Abdominal exam nontender or distended. No masses palpated. Extremities show no edema. neuro grossly intact  ECG-normal sinus rhythm at a rate of 71, no ST changes.  Personally reviewed  A/P  1 coronary artery disease-plan to continue medical therapy with aspirin and statin.  2 hyperlipidemia-continue statin.   3 hypertension-patient's blood pressure is controlled.  Continue present medications and follow-up.    Kirk Ruths, MD

## 2020-05-17 NOTE — Telephone Encounter (Signed)
Spoke with pt this morning and he stated that he was wearing his CPAP during the oxygen study.

## 2020-05-19 NOTE — Telephone Encounter (Signed)
Please call the company where he gets CPAP and see if can get download.  I think his pressure needs to be adjusted.

## 2020-05-23 ENCOUNTER — Encounter: Payer: Self-pay | Admitting: Cardiology

## 2020-05-23 ENCOUNTER — Ambulatory Visit: Payer: Medicare HMO | Admitting: Cardiology

## 2020-05-23 ENCOUNTER — Other Ambulatory Visit: Payer: Self-pay

## 2020-05-23 VITALS — BP 132/66 | HR 71 | Ht 75.0 in | Wt 249.6 lb

## 2020-05-23 DIAGNOSIS — E7800 Pure hypercholesterolemia, unspecified: Secondary | ICD-10-CM

## 2020-05-23 DIAGNOSIS — I10 Essential (primary) hypertension: Secondary | ICD-10-CM | POA: Diagnosis not present

## 2020-05-23 DIAGNOSIS — I2510 Atherosclerotic heart disease of native coronary artery without angina pectoris: Secondary | ICD-10-CM | POA: Diagnosis not present

## 2020-05-23 NOTE — Patient Instructions (Signed)

## 2020-05-24 NOTE — Telephone Encounter (Signed)
Copy of download placed in provider's basket.

## 2020-05-24 NOTE — Telephone Encounter (Signed)
Spoke with Joellen Jersey from [employer] [place] [phone]) and she is faxing download report.

## 2020-06-14 ENCOUNTER — Other Ambulatory Visit: Payer: Self-pay | Admitting: Family Medicine

## 2020-06-14 DIAGNOSIS — F5101 Primary insomnia: Secondary | ICD-10-CM

## 2020-06-22 ENCOUNTER — Encounter: Payer: Self-pay | Admitting: Family Medicine

## 2020-06-23 ENCOUNTER — Other Ambulatory Visit: Payer: Self-pay | Admitting: *Deleted

## 2020-06-23 MED ORDER — MISC. DEVICES MISC
0 refills | Status: DC
Start: 2020-06-23 — End: 2020-11-28

## 2020-06-23 MED ORDER — MISC. DEVICES MISC: 0 refills | Status: DC

## 2020-06-30 ENCOUNTER — Telehealth: Payer: Self-pay | Admitting: *Deleted

## 2020-06-30 NOTE — Telephone Encounter (Signed)
Returning call regarding pt's orders.  She wanted to know if I had received these.  I LVM informing her that I had not and that she could resend the orders to [phone]

## 2020-07-19 ENCOUNTER — Other Ambulatory Visit: Payer: Self-pay | Admitting: Cardiology

## 2020-10-05 DIAGNOSIS — H25013 Cortical age-related cataract, bilateral: Secondary | ICD-10-CM | POA: Diagnosis not present

## 2020-10-05 DIAGNOSIS — H40013 Open angle with borderline findings, low risk, bilateral: Secondary | ICD-10-CM | POA: Diagnosis not present

## 2020-10-05 DIAGNOSIS — H2513 Age-related nuclear cataract, bilateral: Secondary | ICD-10-CM | POA: Diagnosis not present

## 2020-10-05 DIAGNOSIS — H35371 Puckering of macula, right eye: Secondary | ICD-10-CM | POA: Diagnosis not present

## 2020-10-09 ENCOUNTER — Telehealth: Payer: Self-pay | Admitting: Family Medicine

## 2020-10-09 NOTE — Telephone Encounter (Signed)
Left message for patient to call back and schedule Medicare Annual Wellness Visit (AWV) either virtually or in office.   Last AWV 07/07/17  please schedule at anytime with health coach

## 2020-10-09 NOTE — Telephone Encounter (Signed)
Patient returned my call,  he stated he would call the office and schedule appointment

## 2020-10-27 ENCOUNTER — Other Ambulatory Visit: Payer: Self-pay | Admitting: Family Medicine

## 2020-10-27 DIAGNOSIS — F4010 Social phobia, unspecified: Secondary | ICD-10-CM

## 2020-10-30 ENCOUNTER — Other Ambulatory Visit: Payer: Self-pay | Admitting: Family Medicine

## 2020-10-30 DIAGNOSIS — F5101 Primary insomnia: Secondary | ICD-10-CM

## 2020-11-01 ENCOUNTER — Other Ambulatory Visit: Payer: Self-pay

## 2020-11-01 ENCOUNTER — Ambulatory Visit (INDEPENDENT_AMBULATORY_CARE_PROVIDER_SITE_OTHER): Payer: Medicare HMO | Admitting: Family Medicine

## 2020-11-01 ENCOUNTER — Encounter: Payer: Self-pay | Admitting: Family Medicine

## 2020-11-01 VITALS — BP 122/52 | HR 70 | Ht 75.0 in | Wt 258.0 lb

## 2020-11-01 DIAGNOSIS — R69 Illness, unspecified: Secondary | ICD-10-CM | POA: Diagnosis not present

## 2020-11-01 DIAGNOSIS — I10 Essential (primary) hypertension: Secondary | ICD-10-CM

## 2020-11-01 DIAGNOSIS — Z9989 Dependence on other enabling machines and devices: Secondary | ICD-10-CM

## 2020-11-01 DIAGNOSIS — Z6832 Body mass index (BMI) 32.0-32.9, adult: Secondary | ICD-10-CM

## 2020-11-01 DIAGNOSIS — Z125 Encounter for screening for malignant neoplasm of prostate: Secondary | ICD-10-CM | POA: Diagnosis not present

## 2020-11-01 DIAGNOSIS — R7301 Impaired fasting glucose: Secondary | ICD-10-CM

## 2020-11-01 DIAGNOSIS — Z23 Encounter for immunization: Secondary | ICD-10-CM | POA: Diagnosis not present

## 2020-11-01 DIAGNOSIS — G4733 Obstructive sleep apnea (adult) (pediatric): Secondary | ICD-10-CM | POA: Diagnosis not present

## 2020-11-01 DIAGNOSIS — F5101 Primary insomnia: Secondary | ICD-10-CM

## 2020-11-01 DIAGNOSIS — B351 Tinea unguium: Secondary | ICD-10-CM

## 2020-11-01 LAB — POCT GLYCOSYLATED HEMOGLOBIN (HGB A1C): Hemoglobin A1C: 5.5 % (ref 4.0–5.6)

## 2020-11-01 MED ORDER — ZOLPIDEM TARTRATE 10 MG PO TABS: 5.0000 mg | ORAL_TABLET | Freq: Every evening | ORAL | 0 refills | Status: DC | PRN

## 2020-11-01 NOTE — Progress Notes (Addendum)
Established Patient Office Visit  Subjective:  Patient ID: Jeremy Woodward, male    DOB: 03-21-1945  Age: 75 y.o. MRN: [id number]  CC:  Chief Complaint  Patient presents with   Hypertension   Nail Problem    Middle finger on L hand ingrown fingernail he reports that he has mentioned this before and it hasn't gotten better.     HPI THUAN TIPPETT presents for   Impaired fasting glucose-no increased thirst or urination. No symptoms consistent with hypoglycemia.  He said he is concerned because he has been getting a lot of calls and literature about having diabetes.  Hypertension- Pt denies chest pain, SOB.  Taking meds as directed w/o problems.  Denies medication side effects.    Follow-up chronic insomnia-he does currently take Ambien he said he has been having difficulty getting his prescription refilled for about the last week.  He has been wearing his CPAP regularly.  We had tried to get oxygen supplementation for the CPAP because he was still dropping oxygen at night while wearing the CPAP to less than 88%.  On our end it looks like we faxed forms a couple different times and the company told him that we were not filling them out appropriately and not getting back to them.  So on his own he increased his CPAP pressure to 15 and feels like that has actually improved the oxygen drop issue.  He is also looking into getting a different type of facial mask.  Past Medical History:  Diagnosis Date   Arthritis    BPH (benign prostatic hypertrophy)    CAD (coronary artery disease) 2010   Blockage in artery/ widow maker   CHF (congestive heart failure) (HCC)    Depression    Diarrhea    Hyperlipidemia    Hypertension    Obesity    OSA on CPAP    Pleurisy    Sleep apnea     Past Surgical History:  Procedure Laterality Date   LEFT AND RIGHT HEART CATHETERIZATION WITH CORONARY ANGIOGRAM N/A 03/05/2011   Procedure: LEFT AND RIGHT HEART CATHETERIZATION WITH CORONARY ANGIOGRAM;   Surgeon: Larey Dresser, MD;  Location: [facility] CATH LAB;  Service: Cardiovascular;  Laterality: N/A;   LUNG DECORTICATION  1996   right with drainage utilizing VAT for empyema   TONSILLECTOMY      Family History  Problem Relation Age of Onset   Cancer Father 32       liver and lung   Heart disease Mother    Ulcers Mother    Hypertension Mother    Aneurysm Mother    Hyperlipidemia Sister    Hypertension Sister    Colon cancer Maternal Grandmother        late 38's when dx    Esophageal cancer Neg Hx    Stomach cancer Neg Hx    Rectal cancer Neg Hx     Social History   Socioeconomic History   Marital status: Married    Spouse name: Vaughan Basta   Number of children: 1   Years of education: Not on file   Highest education level: Not on file  Occupational History   Occupation: Retired  Tobacco Use   Smoking status: Never   Smokeless tobacco: Never   Tobacco comments:    10/25/19 no smoking at all  Vaping Use   Vaping Use: Never used  Substance and Sexual Activity   Alcohol use: No   Drug use: No   Sexual activity:  Not on file  Other Topics Concern   Not on file  Social History Narrative   Lives with wife   Caffeine- coffee 2 c, occas soda   Social Determinants of Health   Financial Resource Strain: Not on file  Food Insecurity: Not on file  Transportation Needs: Not on file  Physical Activity: Not on file  Stress: Not on file  Social Connections: Not on file  Intimate Partner Violence: Not on file    Outpatient Medications Prior to Visit  Medication Sig Dispense Refill   ACIDOPHILUS LACTOBACILLUS PO Take by mouth.     AMBULATORY NON FORMULARY MEDICATION Medication Name: overnight pulse oximetry.  Dx nocturnal hypoxemia. Fax to [employer] 1 application 0   Ascorbic Acid (VITAMIN C) 1000 MG tablet      aspirin 81 MG tablet Take 81 mg by mouth daily.     cholecalciferol (VITAMIN D3) 25 MCG (1000 UNIT) tablet      clotrimazole-betamethasone (LOTRISONE) cream Apply 1  application topically 2 (two) times daily. 45 g 3   Cyanocobalamin (VITAMIN B 12 PO) Take 1,000 mcg by mouth daily.     diclofenac Sodium (VOLTAREN) 1 % GEL diclofenac 1 % topical gel  APPLY 2 GRAMS TO THE AFFECTED AREA(S) BY TOPICAL ROUTE 4 TIMES PER DAY     diphenoxylate-atropine (LOMOTIL) 2.5-0.025 MG tablet Take 1 tablet by mouth 4 (four) times daily as needed for diarrhea or loose stools.     losartan (COZAAR) 50 MG tablet Take 1 tablet (50 mg total) by mouth daily. 90 tablet 3   Misc. Devices MISC CPAP. Please add 2L of oxygen.  Overnight O2 study confirmed hypoxia on CPAP. 1 each 0   Omega-3 Fatty Acids (FISH OIL) 1000 MG CAPS Take by mouth.     pantoprazole (PROTONIX) 40 MG tablet TAKE 1 TABLET (40 MG TOTAL) BY MOUTH DAILY. 30 tablet 6   rosuvastatin (CRESTOR) 40 MG tablet TAKE ONE TABLET BY MOUTH DAILY 90 tablet 3   sertraline (ZOLOFT) 50 MG tablet TAKE 1 TABLET BY MOUTH EVERY DAY 90 tablet 1   zolpidem (AMBIEN) 10 MG tablet Take 0.5-1 tablets (5-10 mg total) by mouth at bedtime as needed. 90 tablet 0   No facility-administered medications prior to visit.    No Known Allergies  ROS Review of Systems    Objective:    Physical Exam Constitutional:      Appearance: Normal appearance. He is well-developed.  HENT:     Head: Normocephalic and atraumatic.  Cardiovascular:     Rate and Rhythm: Normal rate and regular rhythm.     Heart sounds: Normal heart sounds.  Pulmonary:     Effort: Pulmonary effort is normal.     Breath sounds: Normal breath sounds.  Skin:    General: Skin is warm and dry.  Neurological:     Mental Status: He is alert and oriented to person, place, and time. Mental status is at baseline.  Psychiatric:        Behavior: Behavior normal.    BP (!) 122/52   Pulse 70   Ht 6\' 3"  (1.905 m)   Wt 258 lb (117 kg)   SpO2 100%   BMI 32.25 kg/m  Wt Readings from Last 3 Encounters:  11/01/20 258 lb (117 kg)  05/23/20 249 lb 9.6 oz (113.2 kg)  05/02/20 254  lb (115.2 kg)     Health Maintenance Due  Topic Date Due   Zoster Vaccines- Shingrix (1 of 2) Never done  TETANUS/TDAP  10/10/2020   COVID-19 Vaccine (4 - Booster for Pfizer series) 10/20/2020    There are no preventive care reminders to display for this patient.  Lab Results  Component Value Date   TSH 3.310 03/06/2018   Lab Results  Component Value Date   WBC 6.7 11/11/2018   HGB 14.6 11/11/2018   HCT 43.1 11/11/2018   MCV 88.1 11/11/2018   PLT 226 11/11/2018   Lab Results  Component Value Date   NA 139 05/02/2020   K 4.2 05/02/2020   CO2 24 05/02/2020   GLUCOSE 92 05/02/2020   BUN 21 05/02/2020   CREATININE 1.21 (H) 05/02/2020   BILITOT 0.6 05/02/2020   ALKPHOS 52 03/23/2019   AST 25 05/02/2020   ALT 28 05/02/2020   PROT 6.3 05/02/2020   ALBUMIN 4.6 03/23/2019   CALCIUM 9.2 05/02/2020   GFR 55.61 (L) 03/01/2011   Lab Results  Component Value Date   CHOL 118 05/02/2020   Lab Results  Component Value Date   HDL 45 05/02/2020   Lab Results  Component Value Date   LDLCALC 53 05/02/2020   Lab Results  Component Value Date   TRIG 110 05/02/2020   Lab Results  Component Value Date   CHOLHDL 2.6 05/02/2020   Lab Results  Component Value Date   HGBA1C 5.5 11/01/2020      Assessment & Plan:   Problem List Items Addressed This Visit       Cardiovascular and Mediastinum   Benign essential hypertension    Well controlled. Continue current regimen. Follow up in  6 mo       Relevant Orders   PSA   BASIC METABOLIC PANEL WITH GFR     Respiratory   OSA on CPAP (Chronic)    Currently using 15 cm of water pressure and feels like it is working well.  He is get a check with the DME provider on maybe getting a different type of mask. Will work on trying to get supplemental oxygen based on overnight pulse ox while on CPAP. Had sent order in May but evidently company reports they never received it.          Endocrine   IFG (impaired fasting glucose)     A1c looks great today.  Continue healthy food choices and try to stay active.  Lab Results  Component Value Date   HGBA1C 5.5 11/01/2020         Relevant Orders   POCT glycosylated hemoglobin (Hb A1C) (Completed)     Other   Insomnia (Chronic)    We will make sure Ambien refills are sent to pharmacy he gets those at [facility].      Relevant Medications   zolpidem (AMBIEN) 10 MG tablet   BMI 32.0-32.9,adult    BMI down to 32 which is fantastic.  Continue to work on healthy diet and regular exercise.      Other Visit Diagnoses     Screening for prostate cancer    -  Primary   Relevant Orders   PSA   BASIC METABOLIC PANEL WITH GFR   Need for immunization against influenza       Relevant Orders   Flu Vaccine QUAD High Dose(Fluad) (Completed)   Onychomycosis           Meds ordered this encounter  Medications   zolpidem (AMBIEN) 10 MG tablet    Sig: Take 0.5-1 tablets (5-10 mg total) by mouth at bedtime as needed.  Dispense:  90 tablet    Refill:  0    Follow-up: Return in about 6 months (around 05/02/2021) for Hypertension and IFG.    Beatrice Lecher, MD

## 2020-11-01 NOTE — Assessment & Plan Note (Signed)
A1c looks great today.  Continue healthy food choices and try to stay active.  Lab Results  Component Value Date   HGBA1C 5.5 11/01/2020

## 2020-11-01 NOTE — Assessment & Plan Note (Signed)
Well controlled. Continue current regimen. Follow up in  6 mo  

## 2020-11-01 NOTE — Assessment & Plan Note (Addendum)
Currently using 15 cm of water pressure and feels like it is working well.  He is get a check with the DME provider on maybe getting a different type of mask. Will work on trying to get supplemental oxygen based on overnight pulse ox while on CPAP. Had sent order in May but evidently company reports they never received it.

## 2020-11-01 NOTE — Assessment & Plan Note (Signed)
We will make sure Ambien refills are sent to pharmacy he gets those at [facility].

## 2020-11-01 NOTE — Addendum Note (Signed)
Addended by: Teddy Spike on: 11/01/2020 02:37 PM   Modules accepted: Orders

## 2020-11-01 NOTE — Assessment & Plan Note (Signed)
BMI down to 32 which is fantastic.  Continue to work on healthy diet and regular exercise.

## 2020-11-10 DIAGNOSIS — G4733 Obstructive sleep apnea (adult) (pediatric): Secondary | ICD-10-CM | POA: Diagnosis not present

## 2020-11-13 ENCOUNTER — Other Ambulatory Visit: Payer: Self-pay

## 2020-11-13 ENCOUNTER — Encounter (INDEPENDENT_AMBULATORY_CARE_PROVIDER_SITE_OTHER): Payer: Medicare HMO | Admitting: Ophthalmology

## 2020-11-13 DIAGNOSIS — H2513 Age-related nuclear cataract, bilateral: Secondary | ICD-10-CM

## 2020-11-13 DIAGNOSIS — H35033 Hypertensive retinopathy, bilateral: Secondary | ICD-10-CM

## 2020-11-13 DIAGNOSIS — H43813 Vitreous degeneration, bilateral: Secondary | ICD-10-CM

## 2020-11-13 DIAGNOSIS — H35341 Macular cyst, hole, or pseudohole, right eye: Secondary | ICD-10-CM

## 2020-11-13 DIAGNOSIS — I10 Essential (primary) hypertension: Secondary | ICD-10-CM | POA: Diagnosis not present

## 2020-11-13 DIAGNOSIS — H35371 Puckering of macula, right eye: Secondary | ICD-10-CM | POA: Diagnosis not present

## 2020-11-14 ENCOUNTER — Encounter: Payer: Self-pay | Admitting: Family Medicine

## 2020-11-14 DIAGNOSIS — G4734 Idiopathic sleep related nonobstructive alveolar hypoventilation: Secondary | ICD-10-CM

## 2020-11-15 MED ORDER — AMBULATORY NON FORMULARY MEDICATION: 99 refills | Status: DC

## 2020-11-15 MED ORDER — TERBINAFINE HCL 250 MG PO TABS: 250.0000 mg | ORAL_TABLET | Freq: Every day | ORAL | 0 refills | Status: DC

## 2020-11-15 NOTE — Telephone Encounter (Signed)
Sorry terbinafine sent to pharmacy. Please print and fax non amb rx for oxygen to go with his CPAP to [place]

## 2020-11-22 ENCOUNTER — Ambulatory Visit (INDEPENDENT_AMBULATORY_CARE_PROVIDER_SITE_OTHER): Payer: Medicare HMO | Admitting: Family Medicine

## 2020-11-22 ENCOUNTER — Other Ambulatory Visit: Payer: Self-pay

## 2020-11-22 VITALS — BP 137/64 | HR 67 | Ht 74.0 in | Wt 259.0 lb

## 2020-11-22 DIAGNOSIS — Z125 Encounter for screening for malignant neoplasm of prostate: Secondary | ICD-10-CM | POA: Diagnosis not present

## 2020-11-22 DIAGNOSIS — I10 Essential (primary) hypertension: Secondary | ICD-10-CM | POA: Diagnosis not present

## 2020-11-22 DIAGNOSIS — Z0000 Encounter for general adult medical examination without abnormal findings: Secondary | ICD-10-CM | POA: Diagnosis not present

## 2020-11-22 NOTE — Patient Instructions (Addendum)
[name] and Written Plan of Care  [name] ,  Thank you for allowing me to perform your Medicare Annual Wellness Visit and for your ongoing commitment to your health.   Health Maintenance & Immunization History Health Maintenance  Topic Date Due   COVID-19 Vaccine (5 - Booster for Pfizer series) 12/08/2020 (Originally 09/22/2020)   Zoster Vaccines- Shingrix (1 of 2) 02/22/2021 (Originally 06/29/1964)   TETANUS/TDAP  11/22/2021 (Originally 10/10/2020)   Pneumonia Vaccine 73+ Years old  Completed   INFLUENZA VACCINE  Completed   Hepatitis C Screening  Completed   HPV VACCINES  Aged Out   Immunization History  Administered Date(s) Administered   Fluad Quad(high Dose 65+) 10/26/2019, 11/01/2020   Influenza Split 10/11/2010, 11/06/2011   Influenza Whole 12/01/2008, 12/11/2009, 09/30/2014   Influenza, High Dose Seasonal PF 11/27/2016, 11/21/2017, 11/03/2018   Influenza,inj,Quad PF,6+ Mos 11/19/2013, 09/28/2015   Influenza-Unspecified 09/29/2014, 11/21/2016   PFIZER(Purple Top)SARS-COV-2 Vaccination 02/17/2019, 03/10/2019, 11/18/2019, 07/28/2020   Pneumococcal Conjugate-13 11/19/2013   Pneumococcal Polysaccharide-23 11/14/2011   Td 12/01/2008   Tdap 09/28/2008, 10/11/2010   Zoster, Live 10/11/2010    These are the patient goals that we discussed:  Goals Addressed               This Visit's Progress     Patient Stated (pt-stated)        11/22/2020 AWV Goal: Exercise for General Health  Patient will verbalize understanding of the benefits of increased physical activity: Exercising regularly is important. It will improve your overall fitness, flexibility, and endurance. Regular exercise also will improve your overall health. It can help you control your weight, reduce stress, and improve your bone density. Over the next year, patient will increase physical activity as tolerated with a goal of at least 150 minutes of moderate  physical activity per week.  You can tell that you are exercising at a moderate intensity if your heart starts beating faster and you start breathing faster but can still hold a conversation. Moderate-intensity exercise ideas include: Walking 1 mile (1.6 km) in about 15 minutes Biking Hiking Golfing Dancing Water aerobics Patient will verbalize understanding of everyday activities that increase physical activity by providing examples like the following: Yard work, such as: Sales promotion account executive Gardening Washing windows or floors Patient will be able to explain general safety guidelines for exercising:  Before you start a new exercise program, talk with your health care provider. Do not exercise so much that you hurt yourself, feel dizzy, or get very short of breath. Wear comfortable clothes and wear shoes with good support. Drink plenty of water while you exercise to prevent dehydration or heat stroke. Work out until your breathing and your heartbeat get faster.          This is a list of Health Maintenance Items that are overdue or due now: Td vaccine Shingles vaccine    Orders/Referrals Placed Today: No orders of the defined types were placed in this encounter.  (Contact our referral department at [phone] if you have not spoken with someone about your referral appointment within the next 5 days)    Follow-up Plan Follow-up with [name], MD as planned Schedule your tetanus vaccine and shingles vaccine can be done at your pharmacy.  Medicare wellness visit in one year. AVS printed and given to the patient.    Health Maintenance, Male Adopting a healthy lifestyle  and getting preventive care are important in promoting health and wellness. Ask your health care provider about: The right schedule for you to have regular tests and exams. Things you can do on your own to prevent  diseases and keep yourself healthy. What should I know about diet, weight, and exercise? Eat a healthy diet  Eat a diet that includes plenty of vegetables, fruits, low-fat dairy products, and lean protein. Do not eat a lot of foods that are high in solid fats, added sugars, or sodium. Maintain a healthy weight Body mass index (BMI) is a measurement that can be used to identify possible weight problems. It estimates body fat based on height and weight. Your health care provider can help determine your BMI and help you achieve or maintain a healthy weight. Get regular exercise Get regular exercise. This is one of the most important things you can do for your health. Most adults should: Exercise for at least 150 minutes each week. The exercise should increase your heart rate and make you sweat (moderate-intensity exercise). Do strengthening exercises at least twice a week. This is in addition to the moderate-intensity exercise. Spend less time sitting. Even light physical activity can be beneficial. Watch cholesterol and blood lipids Have your blood tested for lipids and cholesterol at 76 years of age, then have this test every 5 years. You may need to have your cholesterol levels checked more often if: Your lipid or cholesterol levels are high. You are older than 76 years of age. You are at high risk for heart disease. What should I know about cancer screening? Many types of cancers can be detected early and may often be prevented. Depending on your health history and family history, you may need to have cancer screening at various ages. This may include screening for: Colorectal cancer. Prostate cancer. Skin cancer. Lung cancer. What should I know about heart disease, diabetes, and high blood pressure? Blood pressure and heart disease High blood pressure causes heart disease and increases the risk of stroke. This is more likely to develop in people who have high blood pressure readings, are  of African descent, or are overweight. Talk with your health care provider about your target blood pressure readings. Have your blood pressure checked: Every 3-5 years if you are 22-53 years of age. Every year if you are 58 years old or older. If you are between the ages of 70 and 36 and are a current or former smoker, ask your health care provider if you should have a one-time screening for abdominal aortic aneurysm (AAA). Diabetes Have regular diabetes screenings. This checks your fasting blood sugar level. Have the screening done: Once every three years after age 58 if you are at a normal weight and have a low risk for diabetes. More often and at a younger age if you are overweight or have a high risk for diabetes. What should I know about preventing infection? Hepatitis B If you have a higher risk for hepatitis B, you should be screened for this virus. Talk with your health care provider to find out if you are at risk for hepatitis B infection. Hepatitis C Blood testing is recommended for: Everyone born from [birth year] through 1966. Anyone with known risk factors for hepatitis C. Sexually transmitted infections (STIs) You should be screened each year for STIs, including gonorrhea and chlamydia, if: You are sexually active and are younger than 76 years of age. You are older than 76 years of age and your health care  provider tells you that you are at risk for this type of infection. Your sexual activity has changed since you were last screened, and you are at increased risk for chlamydia or gonorrhea. Ask your health care provider if you are at risk. Ask your health care provider about whether you are at high risk for HIV. Your health care provider may recommend a prescription medicine to help prevent HIV infection. If you choose to take medicine to prevent HIV, you should first get tested for HIV. You should then be tested every 3 months for as long as you are taking the medicine. Follow these  instructions at home: Lifestyle Do not use any products that contain nicotine or tobacco, such as cigarettes, e-cigarettes, and chewing tobacco. If you need help quitting, ask your health care provider. Do not use street drugs. Do not share needles. Ask your health care provider for help if you need support or information about quitting drugs. Alcohol use Do not drink alcohol if your health care provider tells you not to drink. If you drink alcohol: Limit how much you have to 0-2 drinks a day. Be aware of how much alcohol is in your drink. In the [place] one drink equals one 12 oz bottle of beer (355 mL), one 5 oz glass of wine (148 mL), or one 1 oz glass of hard liquor (44 mL). General instructions Schedule regular health, dental, and eye exams. Stay current with your vaccines. Tell your health care provider if: You often feel depressed. You have ever been abused or do not feel safe at home. Summary Adopting a healthy lifestyle and getting preventive care are important in promoting health and wellness. Follow your health care provider's instructions about healthy diet, exercising, and getting tested or screened for diseases. Follow your health care provider's instructions on monitoring your cholesterol and blood pressure. This information is not intended to replace advice given to you by your health care provider. Make sure you discuss any questions you have with your health care provider. Document Revised: 03/24/2020 Document Reviewed: 01/07/2018 Elsevier Patient Education  2022 [employer].

## 2020-11-22 NOTE — Progress Notes (Signed)
MEDICARE ANNUAL WELLNESS VISIT  11/22/2020  Subjective:  Jeremy Woodward is a 75 y.o. male patient of Metheney, Rene Kocher, MD who had a Medicare Annual Wellness Visit today. Cobe is Retired and lives with their spouse. he has 1 child. he reports that he is socially active and does interact with friends/family regularly. he is minimally physically active and enjoys yard work, walking and reading.  Patient Care Team: [name], MD as PCP - General (Family Medicine) Stanford Breed Denice Bors, MD as PCP - Cardiology (Cardiology) Stanford Breed Denice Bors, MD as Consulting Physician (Cardiology) Amil Amen, MD as Referring Physician (Psychiatry) Monna Fam, MD as Consulting Physician (Ophthalmology)  Advanced Directives 11/22/2020 07/07/2017 08/01/2013 06/29/2013  Does Patient Have a Medical Advance Directive? No No Patient would not like information Patient does not have advance directive;Patient would not like information  Would patient like information on creating a medical advance directive? No - Patient declined No - Patient declined - [facility] Utilization Over the Past 12 Months: # of hospitalizations or ER visits: 0 # of surgeries: 0  Review of Systems    Patient reports that his overall health is worse when compared to last year.  Review of Systems: History obtained from chart review and the patient  All other systems negative.  Pain Assessment Pain : No/denies pain     Current Medications & Allergies (verified) Allergies as of 11/22/2020   No Known Allergies      Medication List        Accurate as of November 22, 2020  9:55 AM. If you have any questions, ask your nurse or doctor.          ACIDOPHILUS LACTOBACILLUS PO Take by mouth.   AMBULATORY NON FORMULARY MEDICATION Medication Name: overnight pulse oximetry.  Dx nocturnal hypoxemia. Fax to [place] Medication Name: needs 2 Liter supplement overnight oxygen  for his CPAP.  Dx nocturnal hypoxemia.   aspirin 81 MG tablet Take 81 mg by mouth daily.   cholecalciferol 25 MCG (1000 UNIT) tablet Commonly known as: VITAMIN D3   clotrimazole-betamethasone cream Commonly known as: Lotrisone Apply 1 application topically 2 (two) times daily.   diclofenac Sodium 1 % Gel Commonly known as: VOLTAREN diclofenac 1 % topical gel  APPLY 2 GRAMS TO THE AFFECTED AREA(S) BY TOPICAL ROUTE 4 TIMES PER DAY   diphenoxylate-atropine 2.5-0.025 MG tablet Commonly known as: LOMOTIL Take 1 tablet by mouth 4 (four) times daily as needed for diarrhea or loose stools.   Fish Oil 1000 MG Caps Take by mouth.   losartan 50 MG tablet Commonly known as: COZAAR Take 1 tablet (50 mg total) by mouth daily.   Misc. Devices Misc CPAP. Please add 2L of oxygen.  Overnight O2 study confirmed hypoxia on CPAP.   pantoprazole 40 MG tablet Commonly known as: PROTONIX TAKE 1 TABLET (40 MG TOTAL) BY MOUTH DAILY.   rosuvastatin 40 MG tablet Commonly known as: CRESTOR TAKE ONE TABLET BY MOUTH DAILY   sertraline 50 MG tablet Commonly known as: ZOLOFT TAKE 1 TABLET BY MOUTH EVERY DAY   terbinafine 250 MG tablet Commonly known as: LamISIL Take 1 tablet (250 mg total) by mouth daily. X 6 weeks   VITAMIN B 12 PO Take 1,000 mcg by mouth daily.   vitamin C 1000 MG tablet   zolpidem 10 MG tablet Commonly known as: AMBIEN Take 0.5-1 tablets (5-10 mg total) by mouth at bedtime as needed.  History (reviewed): Past Medical History:  Diagnosis Date   Arthritis    BPH (benign prostatic hypertrophy)    CAD (coronary artery disease) 2010   Blockage in artery/ widow maker   CHF (congestive heart failure) (HCC)    Depression    Diarrhea    Hyperlipidemia    Hypertension    Obesity    OSA on CPAP    Pleurisy    Sleep apnea    Past Surgical History:  Procedure Laterality Date   LEFT AND RIGHT HEART CATHETERIZATION WITH CORONARY ANGIOGRAM N/A 03/05/2011    Procedure: LEFT AND RIGHT HEART CATHETERIZATION WITH CORONARY ANGIOGRAM;  Surgeon: Larey Dresser, MD;  Location: [facility] CATH LAB;  Service: Cardiovascular;  Laterality: N/A;   LUNG DECORTICATION  1996   right with drainage utilizing VAT for empyema   TONSILLECTOMY     Family History  Problem Relation Age of Onset   Cancer Father 86       liver and lung   Heart disease Mother    Ulcers Mother    Hypertension Mother    Aneurysm Mother    Hyperlipidemia Sister    Hypertension Sister    Colon cancer Maternal Grandmother        late 40's when dx    Esophageal cancer Neg Hx    Stomach cancer Neg Hx    Rectal cancer Neg Hx    Social History   Socioeconomic History   Marital status: Married    Spouse name: Vaughan Basta   Number of children: 1   Years of education: 13   Highest education level: Some college, no degree  Occupational History   Occupation: Retired  Tobacco Use   Smoking status: Never   Smokeless tobacco: Never   Tobacco comments:    10/25/19 no smoking at all  Vaping Use   Vaping Use: Never used  Substance and Sexual Activity   Alcohol use: No   Drug use: No   Sexual activity: Not on file  Other Topics Concern   Not on file  Social History Narrative   Lives with his wife. He has one child and one step-child. He enjoys yard work, walking and reading.   Social Determinants of Health   Financial Resource Strain: Low Risk    Difficulty of Paying Living Expenses: Not hard at all  Food Insecurity: No Food Insecurity   Worried About Charity fundraiser in the Last Year: Never true   [place] in the Last Year: Never true  Transportation Needs: No Transportation Needs   Lack of Transportation (Medical): No   Lack of Transportation (Non-Medical): No  Physical Activity: Inactive   Days of Exercise per Week: 0 days   Minutes of Exercise per Session: 0 min  Stress: No Stress Concern Present   Feeling of Stress : Not at all  Social Connections: Moderately Integrated    Frequency of Communication with Friends and Family: More than three times a week   Frequency of Social Gatherings with Friends and Family: More than three times a week   Attends Religious Services: More than 4 times per year   Active Member of [employer] or Organizations: No   Attends Archivist Meetings: Never   Marital Status: Married    Activities of Daily Living In your present state of health, do you have any difficulty performing the following activities: 11/22/2020  Hearing? 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  Vision? Darreld Mclean  Comment has cataracts and goes to retina specialist regularly.  Difficulty concentrating or making decisions? N  Walking or climbing stairs? N  Dressing or bathing? N  Doing errands, shopping? N  Preparing Food and eating ? N  Using the Toilet? N  In the past six months, have you accidently leaked urine? N  Comment urine urgency.  Do you have problems with loss of bowel control? N  Managing your Medications? N  Managing your Finances? N  Housekeeping or managing your Housekeeping? N  Some recent data might be hidden    Patient Education/Literacy How often do you need to have someone help you when you read instructions, pamphlets, or other written materials from your doctor or pharmacy?: 1 - Never What is the last grade level you completed in school?: 12th grade  Exercise Current Exercise Habits: Home exercise routine, Type of exercise: strength training/weights;stretching, Time (Minutes): 20, Frequency (Times/Week): 2, Weekly Exercise (Minutes/Week): 40, Intensity: Mild, Exercise limited by: None identified  Diet Patient reports consuming  2-3  meals a day and 3-4 snack(s) a day Patient reports that his primary diet is: Regular Patient reports that she does have regular access to food.   Depression Screen PHQ 2/9 Scores 11/22/2020 11/01/2020 05/02/2020 08/12/2019 11/11/2018 07/07/2017 01/15/2016  PHQ - 2 Score 0 0 2 1 1  0 0   PHQ- 9 Score 1 5 5 2  - - -     Fall Risk Fall Risk  11/22/2020 11/01/2020 11/01/2020 05/02/2020 10/25/2019  Falls in the past year? 0 0 0 0 0  Number falls in past yr: 0 0 0 - -  Injury with Fall? 0 0 0 - -  Risk for fall due to : Impaired balance/gait No Fall Risks No Fall Risks No Fall Risks -  Follow up Falls evaluation completed;Education provided;Falls prevention discussed Falls prevention discussed;Falls evaluation completed Falls prevention discussed;Falls evaluation completed - -     Objective:   BP 137/64 (BP Location: Left Arm, Patient Position: Sitting, Cuff Size: Normal)   Pulse 67   Ht 6\' 2"  (1.88 m)   Wt 259 lb 0.6 oz (117.5 kg)   SpO2 98%   BMI 33.26 kg/m   Last Weight  Most recent update: 11/22/2020  9:08 AM    Weight  117.5 kg (259 lb 0.6 oz)             Body mass index is 33.26 kg/m.  Hearing/Vision  Linda did not have difficulty with hearing/understanding during the face-to-face interview Malikhi did not have difficulty with his vision during the face-to-face interview Reports that he has had a formal eye exam by an eye care professional within the past year Reports that he has not had a formal hearing evaluation within the past year  Cognitive Function: 6CIT Screen 11/22/2020 07/07/2017 01/15/2016  What Year? 0 points 0 points 0 points  What month? 0 points 0 points 0 points  What time? 0 points 0 points 0 points  Count back from 20 0 points 0 points 0 points  Months in reverse 0 points 0 points 0 points  Repeat phrase 0 points 2 points 0 points  Total Score 0 2 0    Normal Cognitive Function Screening: Yes (Normal:0-7, Significant for Dysfunction: >8)  Immunization & Health Maintenance Record Immunization History  Administered Date(s) Administered   Fluad Quad(high Dose 65+) 10/26/2019, 11/01/2020   Influenza Split 10/11/2010, 11/06/2011   Influenza Whole 12/01/2008, 12/11/2009, 09/30/2014   Influenza, High Dose Seasonal PF 11/27/2016,  11/21/2017,  11/03/2018   Influenza,inj,Quad PF,6+ Mos 11/19/2013, 09/28/2015   Influenza-Unspecified 09/29/2014, 11/21/2016   PFIZER(Purple Top)SARS-COV-2 Vaccination 02/17/2019, 03/10/2019, 11/18/2019, 07/28/2020   Pneumococcal Conjugate-13 11/19/2013   Pneumococcal Polysaccharide-23 11/14/2011   Td 12/01/2008   Tdap 09/28/2008, 10/11/2010   Zoster, Live 10/11/2010    Health Maintenance  Topic Date Due   COVID-19 Vaccine (5 - Booster for [place] series) 12/08/2020 (Originally 09/22/2020)   Zoster Vaccines- Shingrix (1 of 2) 02/22/2021 (Originally 06/29/1964)   TETANUS/TDAP  11/22/2021 (Originally 10/10/2020)   Pneumonia Vaccine 64+ Years old  Completed   INFLUENZA VACCINE  Completed   Hepatitis C Screening  Completed   HPV VACCINES  Aged Out       Assessment  This is a routine wellness examination for [employer]  Health Maintenance: Due or Overdue There are no preventive care reminders to display for this patient.   Pamelia Hoit does not need a referral for Community Assistance: Care Management:   no Social Work:    no Prescription Assistance:  no Nutrition/Diabetes Education:  no   Plan:  Personalized Goals  Goals Addressed               This Visit's Progress     Patient Stated (pt-stated)        11/22/2020 AWV Goal: Exercise for General Health  Patient will verbalize understanding of the benefits of increased physical activity: Exercising regularly is important. It will improve your overall fitness, flexibility, and endurance. Regular exercise also will improve your overall health. It can help you control your weight, reduce stress, and improve your bone density. Over the next year, patient will increase physical activity as tolerated with a goal of at least 150 minutes of moderate physical activity per week.  You can tell that you are exercising at a moderate intensity if your heart starts beating faster and you start breathing faster but can still hold a  conversation. Moderate-intensity exercise ideas include: Walking 1 mile (1.6 km) in about 15 minutes Biking Hiking Golfing Dancing Water aerobics Patient will verbalize understanding of everyday activities that increase physical activity by providing examples like the following: Yard work, such as: Sales promotion account executive Gardening Washing windows or floors Patient will be able to explain general safety guidelines for exercising:  Before you start a new exercise program, talk with your health care provider. Do not exercise so much that you hurt yourself, feel dizzy, or get very short of breath. Wear comfortable clothes and wear shoes with good support. Drink plenty of water while you exercise to prevent dehydration or heat stroke. Work out until your breathing and your heartbeat get faster.        Personalized Health Maintenance & Screening Recommendations  Td vaccine Shingles vaccine  Lung Cancer Screening Recommended: no (Low Dose CT Chest recommended if Age 51-80 years, 30 pack-year currently smoking OR have quit w/in past 15 years) Hepatitis C Screening recommended: no HIV Screening recommended: no  Advanced Directives: Written information was not given per the patient's request.  Referrals & Orders No orders of the defined types were placed in this encounter.   Follow-up Plan Follow-up with [name], MD as planned Schedule your tetanus vaccine and shingles vaccine can be done at your pharmacy.  Medicare wellness visit in one year. AVS printed and given to the patient.   I have personally reviewed and noted the following in the patient's chart:  Medical and social history Use of alcohol, tobacco or illicit drugs  Current medications and supplements Functional ability and status Nutritional status Physical activity Advanced directives List of other  physicians Hospitalizations, surgeries, and ER visits in previous 12 months Vitals Screenings to include cognitive, depression, and falls Referrals and appointments  In addition, I have reviewed and discussed with patient certain preventive protocols, quality metrics, and best practice recommendations. A written personalized care plan for preventive services as well as general preventive health recommendations were provided to patient.     Tinnie Gens, RN  11/22/2020

## 2020-11-23 LAB — BASIC METABOLIC PANEL WITH GFR
BUN: 22 mg/dL (ref 7–25)
CO2: 20 mmol/L (ref 20–32)
Calcium: 9.1 mg/dL (ref 8.6–10.3)
Chloride: 109 mmol/L (ref 98–110)
Creat: 1.1 mg/dL (ref 0.70–1.28)
Glucose, Bld: 95 mg/dL (ref 65–99)
Potassium: 4.6 mmol/L (ref 3.5–5.3)
Sodium: 141 mmol/L (ref 135–146)
eGFR: 70 mL/min/{1.73_m2} (ref >=60)

## 2020-11-23 LAB — PSA: PSA: 1.33 ng/mL (ref <=4.00)

## 2020-11-23 NOTE — Progress Notes (Signed)
Your lab work is within acceptable range and there are no concerning findings.   ?

## 2020-11-28 ENCOUNTER — Telehealth: Payer: Self-pay

## 2020-11-28 ENCOUNTER — Other Ambulatory Visit: Payer: Self-pay

## 2020-11-28 DIAGNOSIS — G4733 Obstructive sleep apnea (adult) (pediatric): Secondary | ICD-10-CM

## 2020-11-28 DIAGNOSIS — G4734 Idiopathic sleep related nonobstructive alveolar hypoventilation: Secondary | ICD-10-CM

## 2020-11-28 DIAGNOSIS — Z9989 Dependence on other enabling machines and devices: Secondary | ICD-10-CM

## 2020-11-28 MED ORDER — AMBULATORY NON FORMULARY MEDICATION: 99 refills | Status: DC

## 2020-11-28 MED ORDER — AMBULATORY NON FORMULARY MEDICATION: 99 refills | Status: AC

## 2020-11-28 MED ORDER — MISC. DEVICES MISC: 0 refills | Status: DC

## 2020-11-28 NOTE — Telephone Encounter (Signed)
Received a fax from [employer] wanting more information for night time oxygen orders. I have sent requested information to fax [phone].

## 2020-11-29 DIAGNOSIS — G4734 Idiopathic sleep related nonobstructive alveolar hypoventilation: Secondary | ICD-10-CM | POA: Diagnosis not present

## 2020-11-29 DIAGNOSIS — R0902 Hypoxemia: Secondary | ICD-10-CM | POA: Diagnosis not present

## 2020-12-29 DIAGNOSIS — R0902 Hypoxemia: Secondary | ICD-10-CM | POA: Diagnosis not present

## 2020-12-29 DIAGNOSIS — G4734 Idiopathic sleep related nonobstructive alveolar hypoventilation: Secondary | ICD-10-CM | POA: Diagnosis not present

## 2021-01-29 DIAGNOSIS — G4734 Idiopathic sleep related nonobstructive alveolar hypoventilation: Secondary | ICD-10-CM | POA: Diagnosis not present

## 2021-01-29 DIAGNOSIS — R0902 Hypoxemia: Secondary | ICD-10-CM | POA: Diagnosis not present

## 2021-02-13 DIAGNOSIS — G4733 Obstructive sleep apnea (adult) (pediatric): Secondary | ICD-10-CM | POA: Diagnosis not present

## 2021-02-24 ENCOUNTER — Other Ambulatory Visit: Payer: Self-pay | Admitting: Family Medicine

## 2021-02-24 DIAGNOSIS — F5101 Primary insomnia: Secondary | ICD-10-CM

## 2021-03-01 DIAGNOSIS — R0902 Hypoxemia: Secondary | ICD-10-CM | POA: Diagnosis not present

## 2021-03-01 DIAGNOSIS — G4734 Idiopathic sleep related nonobstructive alveolar hypoventilation: Secondary | ICD-10-CM | POA: Diagnosis not present

## 2021-03-29 DIAGNOSIS — R0902 Hypoxemia: Secondary | ICD-10-CM | POA: Diagnosis not present

## 2021-03-29 DIAGNOSIS — G4734 Idiopathic sleep related nonobstructive alveolar hypoventilation: Secondary | ICD-10-CM | POA: Diagnosis not present

## 2021-04-24 ENCOUNTER — Other Ambulatory Visit: Payer: Self-pay | Admitting: Family Medicine

## 2021-04-29 DIAGNOSIS — R0902 Hypoxemia: Secondary | ICD-10-CM | POA: Diagnosis not present

## 2021-04-29 DIAGNOSIS — G4734 Idiopathic sleep related nonobstructive alveolar hypoventilation: Secondary | ICD-10-CM | POA: Diagnosis not present

## 2021-05-02 ENCOUNTER — Ambulatory Visit: Payer: Medicare HMO | Admitting: Family Medicine

## 2021-05-09 ENCOUNTER — Encounter: Payer: Self-pay | Admitting: Family Medicine

## 2021-05-09 ENCOUNTER — Ambulatory Visit (INDEPENDENT_AMBULATORY_CARE_PROVIDER_SITE_OTHER): Payer: Medicare HMO | Admitting: Family Medicine

## 2021-05-09 VITALS — BP 113/46 | HR 62 | Ht 74.0 in | Wt 270.0 lb

## 2021-05-09 DIAGNOSIS — N1831 Chronic kidney disease, stage 3a: Secondary | ICD-10-CM | POA: Diagnosis not present

## 2021-05-09 DIAGNOSIS — I10 Essential (primary) hypertension: Secondary | ICD-10-CM | POA: Diagnosis not present

## 2021-05-09 DIAGNOSIS — R1310 Dysphagia, unspecified: Secondary | ICD-10-CM | POA: Diagnosis not present

## 2021-05-09 DIAGNOSIS — E7800 Pure hypercholesterolemia, unspecified: Secondary | ICD-10-CM

## 2021-05-09 DIAGNOSIS — R7301 Impaired fasting glucose: Secondary | ICD-10-CM | POA: Diagnosis not present

## 2021-05-09 LAB — POCT GLYCOSYLATED HEMOGLOBIN (HGB A1C): Hemoglobin A1C: 5.5 % (ref 4.0–5.6)

## 2021-05-09 MED ORDER — PANTOPRAZOLE SODIUM 40 MG PO TBEC: 40.0000 mg | DELAYED_RELEASE_TABLET | Freq: Every day | ORAL | 3 refills | Status: DC

## 2021-05-09 NOTE — Progress Notes (Signed)
? ?Established Patient Office Visit ? ?Subjective:  ?Patient ID: Jeremy Woodward, male    DOB: 1946-01-19  Age: 76 y.o. MRN: [id number] ? ?CC: No chief complaint on file. ? ? ?HPI ?ADAIR LAUDERBACK presents for  ? ?Hypertension- Pt denies chest pain, SOB, dizziness, or heart palpitations.  Taking meds as directed w/o problems.  Denies medication side effects.   ? ?Impaired fasting glucose-no increased thirst or urination. No symptoms consistent with hypoglycemia. ? ?Hyperlipidemia - tolerating stating well with no myalgias or significant side effects.  ?Lab Results  ?Component Value Date  ? CHOL 118 05/02/2020  ? HDL 45 05/02/2020  ? LDLCALC 53 05/02/2020  ? LDLDIRECT 89.4 02/13/2009  ? TRIG 110 05/02/2020  ? CHOLHDL 2.6 05/02/2020  ? ?He said he has been a little bit more sedentary and he does get about 2000 steps per day.  But he knows he needs to cut back on soda and sweets.  He has not experienced any chest pain recently but does still feel short of breath sometimes with activities.  Though he feels like it is really more due to being sedentary and with the recent weight gain. ? ?He did want to let me know about an episode recently where he had eaten a pear and then it caused severe heartburn pretty immediately.  He says he took 3 Tums which is what he usually takes and it did not respond and it became quite intense.  He then started salivating.  He felt like it was really just stuck in his esophagus.  He says it lasted for about an hour and a half but it finally eased off and moved away.  He did have to have his esophagus stretched years ago but has not had any problems or complications since. ? ? ? ?Past Medical History:  ?Diagnosis Date  ? Arthritis   ? BPH (benign prostatic hypertrophy)   ? CAD (coronary artery disease) 2010  ? Blockage in artery/ widow maker  ? CHF (congestive heart failure) ([facility])   ? Depression   ? Diarrhea   ? Hyperlipidemia   ? Hypertension   ? Obesity   ? OSA on CPAP   ? Pleurisy   ? Sleep  apnea   ? ? ?Past Surgical History:  ?Procedure Laterality Date  ? LEFT AND RIGHT HEART CATHETERIZATION WITH CORONARY ANGIOGRAM N/A 03/05/2011  ? Procedure: LEFT AND RIGHT HEART CATHETERIZATION WITH CORONARY ANGIOGRAM;  Surgeon: Larey Dresser, MD;  Location: [facility] CATH LAB;  Service: Cardiovascular;  Laterality: N/A;  ? LUNG DECORTICATION  1996  ? right with drainage utilizing VAT for empyema  ? TONSILLECTOMY    ? ? ?Family History  ?Problem Relation Age of Onset  ? Cancer Father 37  ?     liver and lung  ? Heart disease Mother   ? Ulcers Mother   ? Hypertension Mother   ? Aneurysm Mother   ? Hyperlipidemia Sister   ? Hypertension Sister   ? Colon cancer Maternal Grandmother   ?     late 60's when dx   ? Esophageal cancer Neg Hx   ? Stomach cancer Neg Hx   ? Rectal cancer Neg Hx   ? ? ?Social History  ? ?Socioeconomic History  ? Marital status: Married  ?  Spouse name: Vaughan Basta  ? Number of children: 1  ? Years of education: 108  ? Highest education level: Some college, no degree  ?Occupational History  ? Occupation: Retired  ?Tobacco  Use  ? Smoking status: Never  ? Smokeless tobacco: Never  ? Tobacco comments:  ?  10/25/19 no smoking at all  ?Vaping Use  ? Vaping Use: Never used  ?Substance and Sexual Activity  ? Alcohol use: No  ? Drug use: No  ? Sexual activity: Not on file  ?Other Topics Concern  ? Not on file  ?Social History Narrative  ? Lives with his wife. He has one child and one step-child. He enjoys yard work, walking and reading.  ? ?Social Determinants of Health  ? ?Financial Resource Strain: Low Risk   ? Difficulty of Paying Living Expenses: Not hard at all  ?Food Insecurity: No Food Insecurity  ? Worried About Charity fundraiser in the Last Year: Never true  ? Ran Out of Food in the Last Year: Never true  ?Transportation Needs: No Transportation Needs  ? Lack of Transportation (Medical): No  ? Lack of Transportation (Non-Medical): No  ?Physical Activity: Inactive  ? Days of Exercise per Week: 0 days  ?  Minutes of Exercise per Session: 0 min  ?Stress: No Stress Concern Present  ? Feeling of Stress : Not at all  ?Social Connections: Moderately Integrated  ? Frequency of Communication with Friends and Family: More than three times a week  ? Frequency of Social Gatherings with Friends and Family: More than three times a week  ? Attends Religious Services: More than 4 times per year  ? Active Member of Clubs or Organizations: No  ? Attends Archivist Meetings: Never  ? Marital Status: Married  ?Intimate Partner Violence: Not At Risk  ? Fear of Current or Ex-Partner: No  ? Emotionally Abused: No  ? Physically Abused: No  ? Sexually Abused: No  ? ? ?Outpatient Medications Prior to Visit  ?Medication Sig Dispense Refill  ? ACIDOPHILUS LACTOBACILLUS PO Take by mouth.    ? AMBULATORY NON FORMULARY MEDICATION Medication Name: needs 2 Liter supplement overnight oxygen for his CPAP.  Dx nocturnal hypoxemia. 1 Units PRN  ? Ascorbic Acid (VITAMIN C) 1000 MG tablet     ? aspirin 81 MG tablet Take 81 mg by mouth daily.    ? cholecalciferol (VITAMIN D3) 25 MCG (1000 UNIT) tablet     ? Cyanocobalamin (VITAMIN B 12 PO) Take 1,000 mcg by mouth daily.    ? diclofenac Sodium (VOLTAREN) 1 % GEL diclofenac 1 % topical gel ? APPLY 2 GRAMS TO THE AFFECTED AREA(S) BY TOPICAL ROUTE 4 TIMES PER DAY    ? diphenoxylate-atropine (LOMOTIL) 2.5-0.025 MG tablet Take 1 tablet by mouth 4 (four) times daily as needed for diarrhea or loose stools.    ? losartan (COZAAR) 50 MG tablet TAKE 1 TABLET BY MOUTH EVERY DAY 90 tablet 3  ? Misc. Devices MISC CPAP. Please add 2L of oxygen.  Overnight O2 study confirmed hypoxia on CPAP. 1 each 0  ? rosuvastatin (CRESTOR) 40 MG tablet TAKE ONE TABLET BY MOUTH DAILY 90 tablet 3  ? sertraline (ZOLOFT) 50 MG tablet TAKE 1 TABLET BY MOUTH EVERY DAY 90 tablet 1  ? zolpidem (AMBIEN) 10 MG tablet TAKE 1/2 TO 1 TABLET BY MOUTH EVERY NIGHT AT BEDTIME AS NEEDED 90 tablet 0  ? pantoprazole (PROTONIX) 40 MG tablet  TAKE 1 TABLET (40 MG TOTAL) BY MOUTH DAILY. 30 tablet 6  ? AMBULATORY NON FORMULARY MEDICATION Medication Name: overnight pulse oximetry.  Dx nocturnal hypoxemia. Fax to [facility] 1 application 0  ? clotrimazole-betamethasone (LOTRISONE) cream Apply 1  application topically 2 (two) times daily. 45 g 3  ? Omega-3 Fatty Acids (FISH OIL) 1000 MG CAPS Take by mouth.    ? terbinafine (LAMISIL) 250 MG tablet Take 1 tablet (250 mg total) by mouth daily. X 6 weeks 45 tablet 0  ? ?No facility-administered medications prior to visit.  ? ? ?No Known Allergies ? ?ROS ?Review of Systems ? ?  ?Objective:  ?  ?Physical Exam ?Constitutional:   ?   Appearance: Normal appearance. He is well-developed.  ?HENT:  ?   Head: Normocephalic and atraumatic.  ?Cardiovascular:  ?   Rate and Rhythm: Normal rate and regular rhythm.  ?   Heart sounds: Normal heart sounds.  ?Pulmonary:  ?   Effort: Pulmonary effort is normal.  ?   Breath sounds: Normal breath sounds.  ?Skin: ?   General: Skin is warm and dry.  ?Neurological:  ?   Mental Status: He is alert and oriented to person, place, and time. Mental status is at baseline.  ?Psychiatric:     ?   Behavior: Behavior normal.  ? ? ?BP (!) 113/46   Pulse 62   Wt 270 lb (122.5 kg)   SpO2 100%   BMI 34.67 kg/m?  ?Wt Readings from Last 3 Encounters:  ?05/09/21 270 lb (122.5 kg)  ?11/22/20 259 lb 0.6 oz (117.5 kg)  ?11/01/20 258 lb (117 kg)  ? ? ? ?There are no preventive care reminders to display for this patient. ? ? ?There are no preventive care reminders to display for this patient. ? ?Lab Results  ?Component Value Date  ? TSH 3.310 03/06/2018  ? ?Lab Results  ?Component Value Date  ? WBC 6.7 11/11/2018  ? HGB 14.6 11/11/2018  ? HCT 43.1 11/11/2018  ? MCV 88.1 11/11/2018  ? PLT 226 11/11/2018  ? ?Lab Results  ?Component Value Date  ? NA 141 11/22/2020  ? K 4.6 11/22/2020  ? CO2 20 11/22/2020  ? GLUCOSE 95 11/22/2020  ? BUN 22 11/22/2020  ? CREATININE 1.10 11/22/2020  ? BILITOT 0.6 05/02/2020  ?  ALKPHOS 52 03/23/2019  ? AST 25 05/02/2020  ? ALT 28 05/02/2020  ? PROT 6.3 05/02/2020  ? ALBUMIN 4.6 03/23/2019  ? CALCIUM 9.1 11/22/2020  ? EGFR 70 11/22/2020  ? GFR 55.61 (L) 03/01/2011  ? ?Lab Results  ?Component

## 2021-05-09 NOTE — Assessment & Plan Note (Signed)
Due to recheck renal function. 

## 2021-05-09 NOTE — Assessment & Plan Note (Signed)
A1c looks fantastic at 5.5. ?

## 2021-05-09 NOTE — Assessment & Plan Note (Signed)
Due to recheck lipids and liver ?

## 2021-05-09 NOTE — Assessment & Plan Note (Signed)
Well controlled. Continue current regimen. Follow up in  6 mo  

## 2021-05-10 LAB — LIPID PANEL W/REFLEX DIRECT LDL
Cholesterol: 131 mg/dL (ref <200)
HDL: 51 mg/dL (ref >=40)
LDL Cholesterol (Calc): 57 mg/dL (calc)
Non-HDL Cholesterol (Calc): 80 mg/dL (calc) (ref <130)
Total CHOL/HDL Ratio: 2.6 (calc) (ref <5.0)
Triglycerides: 150 mg/dL — ABNORMAL HIGH (ref <150)

## 2021-05-10 LAB — CBC
HCT: 41.4 % (ref 38.5–50.0)
Hemoglobin: 14.1 g/dL (ref 13.2–17.1)
MCH: 31.1 pg (ref 27.0–33.0)
MCHC: 34.1 g/dL (ref 32.0–36.0)
MCV: 91.4 fL (ref 80.0–100.0)
MPV: 9.5 fL (ref 7.5–12.5)
Platelets: 222 10*3/uL (ref 140–400)
RBC: 4.53 10*6/uL (ref 4.20–5.80)
RDW: 12.4 % (ref 11.0–15.0)
WBC: 6.5 10*3/uL (ref 3.8–10.8)

## 2021-05-10 LAB — COMPLETE METABOLIC PANEL WITH GFR
AG Ratio: 2.2 (calc) (ref 1.0–2.5)
ALT: 31 U/L (ref 9–46)
AST: 27 U/L (ref 10–35)
Albumin: 4.3 g/dL (ref 3.6–5.1)
Alkaline phosphatase (APISO): 47 U/L (ref 35–144)
BUN/Creatinine Ratio: 13 (calc) (ref 6–22)
BUN: 18 mg/dL (ref 7–25)
CO2: 24 mmol/L (ref 20–32)
Calcium: 9.4 mg/dL (ref 8.6–10.3)
Chloride: 106 mmol/L (ref 98–110)
Creat: 1.34 mg/dL — ABNORMAL HIGH (ref 0.70–1.28)
Globulin: 2 g/dL (calc) (ref 1.9–3.7)
Glucose, Bld: 107 mg/dL — ABNORMAL HIGH (ref 65–99)
Potassium: 4.6 mmol/L (ref 3.5–5.3)
Sodium: 139 mmol/L (ref 135–146)
Total Bilirubin: 0.5 mg/dL (ref 0.2–1.2)
Total Protein: 6.3 g/dL (ref 6.1–8.1)
eGFR: 55 mL/min/{1.73_m2} — ABNORMAL LOW (ref >=60)

## 2021-05-10 NOTE — Progress Notes (Signed)
[place], total cholesterol and LDL look great.  Lisuride is almost at goal.  Kidney function is up just a little bit from previous.  Normally are between 1.1 and 1.2.  This time was 1.3 so I do want to keep a little closer eye on it and instead of checking in 6 months lets recheck in 3 months.  Liver function is normal.  Blood count looks great.

## 2021-05-10 NOTE — Progress Notes (Signed)
Yes please. Thank you

## 2021-05-11 ENCOUNTER — Other Ambulatory Visit: Payer: Self-pay | Admitting: Neurology

## 2021-05-11 DIAGNOSIS — N1831 Chronic kidney disease, stage 3a: Secondary | ICD-10-CM

## 2021-05-14 ENCOUNTER — Encounter (INDEPENDENT_AMBULATORY_CARE_PROVIDER_SITE_OTHER): Payer: Medicare HMO | Admitting: Ophthalmology

## 2021-05-14 DIAGNOSIS — H35033 Hypertensive retinopathy, bilateral: Secondary | ICD-10-CM

## 2021-05-14 DIAGNOSIS — I10 Essential (primary) hypertension: Secondary | ICD-10-CM

## 2021-05-14 DIAGNOSIS — H43813 Vitreous degeneration, bilateral: Secondary | ICD-10-CM

## 2021-05-14 DIAGNOSIS — H35341 Macular cyst, hole, or pseudohole, right eye: Secondary | ICD-10-CM | POA: Diagnosis not present

## 2021-05-14 DIAGNOSIS — H35371 Puckering of macula, right eye: Secondary | ICD-10-CM

## 2021-05-29 DIAGNOSIS — R0902 Hypoxemia: Secondary | ICD-10-CM | POA: Diagnosis not present

## 2021-05-29 DIAGNOSIS — G4734 Idiopathic sleep related nonobstructive alveolar hypoventilation: Secondary | ICD-10-CM | POA: Diagnosis not present

## 2021-06-09 ENCOUNTER — Other Ambulatory Visit: Payer: Self-pay | Admitting: Family Medicine

## 2021-06-09 DIAGNOSIS — F4010 Social phobia, unspecified: Secondary | ICD-10-CM

## 2021-06-25 ENCOUNTER — Other Ambulatory Visit: Payer: Self-pay | Admitting: Family Medicine

## 2021-06-25 DIAGNOSIS — F5101 Primary insomnia: Secondary | ICD-10-CM

## 2021-06-29 DIAGNOSIS — R0902 Hypoxemia: Secondary | ICD-10-CM | POA: Diagnosis not present

## 2021-06-29 DIAGNOSIS — G4734 Idiopathic sleep related nonobstructive alveolar hypoventilation: Secondary | ICD-10-CM | POA: Diagnosis not present

## 2021-07-06 DIAGNOSIS — G4733 Obstructive sleep apnea (adult) (pediatric): Secondary | ICD-10-CM | POA: Diagnosis not present

## 2021-07-23 ENCOUNTER — Other Ambulatory Visit: Payer: Self-pay | Admitting: Cardiology

## 2021-07-29 DIAGNOSIS — G4734 Idiopathic sleep related nonobstructive alveolar hypoventilation: Secondary | ICD-10-CM | POA: Diagnosis not present

## 2021-07-29 DIAGNOSIS — R0902 Hypoxemia: Secondary | ICD-10-CM | POA: Diagnosis not present

## 2021-08-23 DIAGNOSIS — N1831 Chronic kidney disease, stage 3a: Secondary | ICD-10-CM | POA: Diagnosis not present

## 2021-08-24 LAB — COMPLETE METABOLIC PANEL WITH GFR
AG Ratio: 2 (calc) (ref 1.0–2.5)
ALT: 30 U/L (ref 9–46)
AST: 24 U/L (ref 10–35)
Albumin: 4.3 g/dL (ref 3.6–5.1)
Alkaline phosphatase (APISO): 43 U/L (ref 35–144)
BUN: 23 mg/dL (ref 7–25)
CO2: 23 mmol/L (ref 20–32)
Calcium: 9.4 mg/dL (ref 8.6–10.3)
Chloride: 107 mmol/L (ref 98–110)
Creat: 1.1 mg/dL (ref 0.70–1.28)
Globulin: 2.2 g/dL (calc) (ref 1.9–3.7)
Glucose, Bld: 91 mg/dL (ref 65–99)
Potassium: 4.9 mmol/L (ref 3.5–5.3)
Sodium: 140 mmol/L (ref 135–146)
Total Bilirubin: 0.6 mg/dL (ref 0.2–1.2)
Total Protein: 6.5 g/dL (ref 6.1–8.1)
eGFR: 69 mL/min/{1.73_m2} (ref >=60)

## 2021-08-24 NOTE — Progress Notes (Signed)
Your lab work is within acceptable range and there are no concerning findings.   ?

## 2021-08-29 DIAGNOSIS — R0902 Hypoxemia: Secondary | ICD-10-CM | POA: Diagnosis not present

## 2021-08-29 DIAGNOSIS — G4734 Idiopathic sleep related nonobstructive alveolar hypoventilation: Secondary | ICD-10-CM | POA: Diagnosis not present

## 2021-09-29 DIAGNOSIS — G4734 Idiopathic sleep related nonobstructive alveolar hypoventilation: Secondary | ICD-10-CM | POA: Diagnosis not present

## 2021-09-29 DIAGNOSIS — R0902 Hypoxemia: Secondary | ICD-10-CM | POA: Diagnosis not present

## 2021-10-09 DIAGNOSIS — G4733 Obstructive sleep apnea (adult) (pediatric): Secondary | ICD-10-CM | POA: Diagnosis not present

## 2021-10-17 ENCOUNTER — Other Ambulatory Visit: Payer: Self-pay | Admitting: Family Medicine

## 2021-10-17 DIAGNOSIS — F5101 Primary insomnia: Secondary | ICD-10-CM

## 2021-10-29 DIAGNOSIS — R0902 Hypoxemia: Secondary | ICD-10-CM | POA: Diagnosis not present

## 2021-10-29 DIAGNOSIS — G4734 Idiopathic sleep related nonobstructive alveolar hypoventilation: Secondary | ICD-10-CM | POA: Diagnosis not present

## 2021-10-30 NOTE — Progress Notes (Signed)
HPI: FU coronary artery disease. History of empyema and VATS/decortication, HTN, and long-standing exertional shortness of breath. Abd ultrasound 2010 with no aneurysm. Patient reported somewhat increased exertional dyspnea as well as occasional chest pain episodes, so ETT was done in 1/13, showing 6:20 exercise with discontinuation due to profound dyspnea and some chest pain. Given very severe symptoms with ETT and progressive symptoms, LHC and RHC repeated and showed normal right and left heart filling pressure. LHC showed stable to improved coronary disease, with 40% distal LM and 40-50% proximal LAD. Since last seen, he has occasional mild dyspnea on exertion but no orthopnea, PND, pedal edema or syncope.  Occasional brief chest pain for 1 to 2 seconds in various locations but no exertional chest pain.  Current Outpatient Medications  Medication Sig Dispense Refill   ACIDOPHILUS LACTOBACILLUS PO Take by mouth.     AMBULATORY NON FORMULARY MEDICATION Medication Name: needs 2 Liter supplement overnight oxygen for his CPAP.  Dx nocturnal hypoxemia. 1 Units PRN   Ascorbic Acid (VITAMIN C) 1000 MG tablet      aspirin 81 MG tablet Take 81 mg by mouth daily.     cholecalciferol (VITAMIN D3) 25 MCG (1000 UNIT) tablet      Cyanocobalamin (VITAMIN B 12 PO) Take 1,000 mcg by mouth daily.     diclofenac Sodium (VOLTAREN) 1 % GEL diclofenac 1 % topical gel  APPLY 2 GRAMS TO THE AFFECTED AREA(S) BY TOPICAL ROUTE 4 TIMES PER DAY     losartan (COZAAR) 50 MG tablet TAKE 1 TABLET BY MOUTH EVERY DAY 90 tablet 3   Misc. Devices MISC CPAP. Please add 2L of oxygen.  Overnight O2 study confirmed hypoxia on CPAP. 1 each 0   pantoprazole (PROTONIX) 40 MG tablet Take 1 tablet (40 mg total) by mouth daily. 90 tablet 3   rosuvastatin (CRESTOR) 40 MG tablet Take 1 tablet (40 mg total) by mouth daily. 60 tablet 2   sertraline (ZOLOFT) 50 MG tablet Take 1.5 tablets (75 mg total) by mouth daily. 135 tablet 0   zolpidem  (AMBIEN) 10 MG tablet TAKE 1/2 TO 1 TABLET BY MOUTH EVERY NIGHT AT BEDTIME AS NEEDED 90 tablet 0   No current facility-administered medications for this visit.     Past Medical History:  Diagnosis Date   Arthritis    BPH (benign prostatic hypertrophy)    CAD (coronary artery disease) 2010   Blockage in artery/ widow maker   CHF (congestive heart failure) (HCC)    Depression    Diarrhea    Hyperlipidemia    Hypertension    Obesity    OSA on CPAP    Pleurisy    Sleep apnea     Past Surgical History:  Procedure Laterality Date   LEFT AND RIGHT HEART CATHETERIZATION WITH CORONARY ANGIOGRAM N/A 03/05/2011   Procedure: LEFT AND RIGHT HEART CATHETERIZATION WITH CORONARY ANGIOGRAM;  Surgeon: Larey Dresser, MD;  Location: [facility] CATH LAB;  Service: Cardiovascular;  Laterality: N/A;   LUNG DECORTICATION  1996   right with drainage utilizing VAT for empyema   TONSILLECTOMY      Social History   Socioeconomic History   Marital status: Married    Spouse name: Vaughan Basta   Number of children: 1   Years of education: 13   Highest education level: Some college, no degree  Occupational History   Occupation: Retired  Tobacco Use   Smoking status: Never   Smokeless tobacco: Never   Tobacco comments:  10/25/19 no smoking at all  Vaping Use   Vaping Use: Never used  Substance and Sexual Activity   Alcohol use: No   Drug use: No   Sexual activity: Not on file  Other Topics Concern   Not on file  Social History Narrative   Lives with his wife. He has one child and one step-child. He enjoys yard work, walking and reading.   Social Determinants of Health   Financial Resource Strain: Low Risk  (11/22/2020)   Overall Financial Resource Strain (CARDIA)    Difficulty of Paying Living Expenses: Not hard at all  Food Insecurity: No Food Insecurity (11/22/2020)   Hunger Vital Sign    Worried About Running Out of Food in the Last Year: Never true    Ran Out of Food in the Last Year: Never  true  Transportation Needs: No Transportation Needs (11/22/2020)   PRAPARE - Hydrologist (Medical): No    Lack of Transportation (Non-Medical): No  Physical Activity: Inactive (11/22/2020)   Exercise Vital Sign    Days of Exercise per Week: 0 days    Minutes of Exercise per Session: 0 min  Stress: No Stress Concern Present (11/22/2020)   [place]    Feeling of Stress : Not at all  Social Connections: Moderately Integrated (11/22/2020)   Social Connection and Isolation Panel [NHANES]    Frequency of Communication with Friends and Family: More than three times a week    Frequency of Social Gatherings with Friends and Family: More than three times a week    Attends Religious Services: More than 4 times per year    Active Member of [employer] or Organizations: No    Attends Archivist Meetings: Never    Marital Status: Married  Human resources officer Violence: Not At Risk (11/22/2020)   Humiliation, Afraid, Rape, and Kick questionnaire    Fear of Current or Ex-Partner: No    Emotionally Abused: No    Physically Abused: No    Sexually Abused: No    Family History  Problem Relation Age of Onset   Cancer Father 8       liver and lung   Heart disease Mother    Ulcers Mother    Hypertension Mother    Aneurysm Mother    Hyperlipidemia Sister    Hypertension Sister    Colon cancer Maternal Grandmother        late 28's when dx    Esophageal cancer Neg Hx    Stomach cancer Neg Hx    Rectal cancer Neg Hx     ROS: no fevers or chills, productive cough, hemoptysis, dysphasia, odynophagia, melena, hematochezia, dysuria, hematuria, rash, seizure activity, orthopnea, PND, pedal edema, claudication. Remaining systems are negative.  Physical Exam: Well-developed well-nourished in no acute distress.  Skin is warm and dry.  HEENT is normal.  Neck is supple.  Chest is clear to auscultation  with normal expansion.  Cardiovascular exam is regular rate and rhythm.  Abdominal exam nontender or distended. No masses palpated. Extremities show no edema. neuro grossly intact  ECG-normal sinus rhythm at a rate of 68, RV conduction delay.  Personally reviewed  A/P  1 nonobstructive coronary disease-patient denies chest pain.  Plan to continue medical therapy with aspirin and statin.  2 hypertension-blood pressure controlled.  Continue present medications.  3 hyperlipidemia-continue statin.  Kirk Ruths, MD

## 2021-11-08 ENCOUNTER — Ambulatory Visit (INDEPENDENT_AMBULATORY_CARE_PROVIDER_SITE_OTHER): Payer: Medicare HMO | Admitting: Family Medicine

## 2021-11-08 VITALS — BP 138/49 | HR 58 | Ht 74.0 in | Wt 264.0 lb

## 2021-11-08 DIAGNOSIS — N1831 Chronic kidney disease, stage 3a: Secondary | ICD-10-CM

## 2021-11-08 DIAGNOSIS — R69 Illness, unspecified: Secondary | ICD-10-CM | POA: Diagnosis not present

## 2021-11-08 DIAGNOSIS — Z23 Encounter for immunization: Secondary | ICD-10-CM

## 2021-11-08 DIAGNOSIS — G4700 Insomnia, unspecified: Secondary | ICD-10-CM

## 2021-11-08 DIAGNOSIS — R7301 Impaired fasting glucose: Secondary | ICD-10-CM

## 2021-11-08 DIAGNOSIS — I10 Essential (primary) hypertension: Secondary | ICD-10-CM

## 2021-11-08 DIAGNOSIS — F4010 Social phobia, unspecified: Secondary | ICD-10-CM

## 2021-11-08 DIAGNOSIS — F324 Major depressive disorder, single episode, in partial remission: Secondary | ICD-10-CM

## 2021-11-08 MED ORDER — SERTRALINE HCL 50 MG PO TABS: 75.0000 mg | ORAL_TABLET | Freq: Every day | ORAL | 0 refills | Status: DC

## 2021-11-08 NOTE — Assessment & Plan Note (Signed)
He is back to a whole tab nightly he had really worked hard to get down to a half a tab of Ambien.  So encouraged him to continue to work to getting back down to a half a tab again.  He mostly struggles with falling asleep.  He has made some changes such as getting rid of blue lights before bedtime.

## 2021-11-08 NOTE — Assessment & Plan Note (Signed)
Couple of A1c's have looked great.  Some get a go ahead and recheck again today.

## 2021-11-08 NOTE — Assessment & Plan Note (Signed)
He says sometimes he just does not feel motivated to get out and do things he is pretty involved in his church though he notices when he takes the sertraline he does better.  We discussed increasing his dose to 75 mg for a month to see if he feels like it is working more effectively for him.  He says that he will try it and let me know.

## 2021-11-08 NOTE — Assessment & Plan Note (Signed)
To need to follow renal function every 6 months.  We discussed switching to Tylenol as his mainstay pain reliever and decreasing use of NSAIDs.

## 2021-11-08 NOTE — Assessment & Plan Note (Signed)
Pressure little bit elevated but he is technically under 140 and he is over 75.  But optimally I would like to see him back in the 120s.  He says he is gained about 8 pounds and I suspect that is probably what is pushing his pressure up.  He says he has not really changed his diet but he has been more sedentary.  So we discussed ways of getting moving starting to walk again.

## 2021-11-08 NOTE — Progress Notes (Signed)
Established Patient Office Visit  Subjective   Patient ID: Jeremy Woodward, male    DOB: [birth year]  Age: 76 y.o. MRN: [id number]  Chief Complaint  Patient presents with   Hypertension    Follow up   change in balance    Patient c/o changes in balance - unsteady on feet x last couple of months    HPI   Hypertension- Pt denies chest pain, SOB, dizziness, or heart palpitations.  Taking meds as directed w/o problems.  Denies medication side effects.  He has follow-up with cardiology next week.  He reports that home blood pressures have been running in the low 130s lately.  A couple of months ago when he was down about 8 pounds his blood pressures were running in the 120s.  Patient c/o changes in balance - unsteady on feet x last couple of months  Have some questions about Tylenol versus ibuprofen as a pain reliever.  See note below in regards to depression follow-up.  Follow-up insomnia-he had really worked hard to get down to half a tab of Ambien and some nights not taking it at all but gradually found himself going back to a whole tab because of sleep difficulty.    ROS    Objective:     BP (!) 138/49   Pulse (!) 58   Ht '6\' 2"'$  (1.88 m)   Wt 264 lb (119.7 kg)   SpO2 98%   BMI 33.90 kg/m    Physical Exam Constitutional:      Appearance: He is well-developed.  HENT:     Head: Normocephalic and atraumatic.  Cardiovascular:     Rate and Rhythm: Normal rate and regular rhythm.     Heart sounds: Normal heart sounds.  Pulmonary:     Effort: Pulmonary effort is normal.     Breath sounds: Normal breath sounds.  Skin:    General: Skin is warm and dry.  Neurological:     Mental Status: He is alert and oriented to person, place, and time.  Psychiatric:        Behavior: Behavior normal.     No results found for any visits on 11/08/21.    The 10-year ASCVD risk score (Arnett DK, et al., 2019) is: 32%    Assessment & Plan:   Problem List Items Addressed This  Visit       Cardiovascular and Mediastinum   Benign essential hypertension - Primary    Pressure little bit elevated but he is technically under 140 and he is over 75.  But optimally I would like to see him back in the 120s.  He says he is gained about 8 pounds and I suspect that is probably what is pushing his pressure up.  He says he has not really changed his diet but he has been more sedentary.  So we discussed ways of getting moving starting to walk again.      Relevant Orders   BASIC METABOLIC PANEL WITH GFR   Lipid Panel w/reflex Direct LDL   Hemoglobin A1c     Endocrine   IFG (impaired fasting glucose)    Couple of A1c's have looked great.  Some get a go ahead and recheck again today.      Relevant Orders   BASIC METABOLIC PANEL WITH GFR   Lipid Panel w/reflex Direct LDL   Hemoglobin A1c     Genitourinary   CKD (chronic kidney disease) stage 3, GFR 30-59 ml/min (HCC)    To  need to follow renal function every 6 months.  We discussed switching to Tylenol as his mainstay pain reliever and decreasing use of NSAIDs.      Relevant Orders   BASIC METABOLIC PANEL WITH GFR   Lipid Panel w/reflex Direct LDL   Hemoglobin A1c     Other   Insomnia (Chronic)    He is back to a whole tab nightly he had really worked hard to get down to a half a tab of Ambien.  So encouraged him to continue to work to getting back down to a half a tab again.  He mostly struggles with falling asleep.  He has made some changes such as getting rid of blue lights before bedtime.      Social anxiety disorder   Relevant Medications   sertraline (ZOLOFT) 50 MG tablet   Depression, major, single episode, in partial remission ([place])    He says sometimes he just does not feel motivated to get out and do things he is pretty involved in his church though he notices when he takes the sertraline he does better.  We discussed increasing his dose to 75 mg for a month to see if he feels like it is working more  effectively for him.  He says that he will try it and let me know.      Relevant Medications   sertraline (ZOLOFT) 50 MG tablet   Other Visit Diagnoses     Flu vaccine need       Relevant Orders   Flu Vaccine QUAD High Dose(Fluad) (Completed)       Follow-up with cardiology next week.  Return in about 4 weeks (around 12/06/2021) for To discuss balance issues.Beatrice Lecher, MD

## 2021-11-09 LAB — LIPID PANEL W/REFLEX DIRECT LDL
Cholesterol: 106 mg/dL (ref <200)
HDL: 42 mg/dL (ref >=40)
LDL Cholesterol (Calc): 45 mg/dL (calc)
Non-HDL Cholesterol (Calc): 64 mg/dL (calc) (ref <130)
Total CHOL/HDL Ratio: 2.5 (calc) (ref <5.0)
Triglycerides: 111 mg/dL (ref <150)

## 2021-11-09 LAB — HEMOGLOBIN A1C
Hgb A1c MFr Bld: 5.8 % of total Hgb — ABNORMAL HIGH (ref <5.7)
Mean Plasma Glucose: 120 mg/dL
eAG (mmol/L): 6.6 mmol/L

## 2021-11-09 LAB — BASIC METABOLIC PANEL WITH GFR
BUN: 21 mg/dL (ref 7–25)
CO2: 23 mmol/L (ref 20–32)
Calcium: 9.4 mg/dL (ref 8.6–10.3)
Chloride: 107 mmol/L (ref 98–110)
Creat: 1.25 mg/dL (ref 0.70–1.28)
Glucose, Bld: 98 mg/dL (ref 65–99)
Potassium: 4.3 mmol/L (ref 3.5–5.3)
Sodium: 140 mmol/L (ref 135–146)
eGFR: 59 mL/min/{1.73_m2} — ABNORMAL LOW (ref >=60)

## 2021-11-09 NOTE — Progress Notes (Signed)
Hi Jeremy Woodward, about panel overall looks good.  A1c is slightly elevated at 5.8.  Continue to work on healthy diet and regular exercise.  Cholesterol at goal.

## 2021-11-13 ENCOUNTER — Ambulatory Visit: Payer: Medicare HMO | Attending: Cardiology | Admitting: Cardiology

## 2021-11-13 ENCOUNTER — Encounter: Payer: Self-pay | Admitting: Cardiology

## 2021-11-13 VITALS — BP 126/66 | HR 68 | Ht 75.0 in | Wt 264.2 lb

## 2021-11-13 DIAGNOSIS — I10 Essential (primary) hypertension: Secondary | ICD-10-CM | POA: Diagnosis not present

## 2021-11-13 DIAGNOSIS — E7800 Pure hypercholesterolemia, unspecified: Secondary | ICD-10-CM

## 2021-11-13 DIAGNOSIS — I2510 Atherosclerotic heart disease of native coronary artery without angina pectoris: Secondary | ICD-10-CM

## 2021-11-13 NOTE — Patient Instructions (Signed)
  Follow-Up: At [facility], you and your health needs are our priority.  As part of our continuing mission to provide you with exceptional heart care, we have created designated Provider Care Teams.  These Care Teams include your primary Cardiologist (physician) and Advanced Practice Providers (APPs -  Physician Assistants and Nurse Practitioners) who all work together to provide you with the care you need, when you need it.  We recommend signing up for the patient portal called "MyChart".  Sign up information is provided on this After Visit Summary.  MyChart is used to connect with patients for Virtual Visits (Telemedicine).  Patients are able to view lab/test results, encounter notes, upcoming appointments, etc.  Non-urgent messages can be sent to your provider as well.   To learn more about what you can do with MyChart, go to [email or web address].    Your next appointment:   12 month(s)  The format for your next appointment:   In Person  Provider:   Brian Crenshaw, MD   

## 2021-11-28 ENCOUNTER — Ambulatory Visit (INDEPENDENT_AMBULATORY_CARE_PROVIDER_SITE_OTHER): Payer: Medicare HMO | Admitting: Family Medicine

## 2021-11-28 VITALS — BP 147/54 | HR 65 | Ht 75.0 in | Wt 266.0 lb

## 2021-11-28 DIAGNOSIS — Z0000 Encounter for general adult medical examination without abnormal findings: Secondary | ICD-10-CM | POA: Diagnosis not present

## 2021-11-28 NOTE — Patient Instructions (Addendum)
[place] Maintenance Summary and Written Plan of Care  [name] ,  Thank you for allowing me to perform your Medicare Annual Wellness Visit and for your ongoing commitment to your health.   Health Maintenance & Immunization History Health Maintenance  Topic Date Due   COVID-19 Vaccine (5 - Pfizer risk series) 05/10/2022 (Originally 09/22/2020)   Zoster Vaccines- Shingrix (1 of 2) 05/10/2022 (Originally 06/29/1964)   TETANUS/TDAP  11/29/2022 (Originally 10/10/2020)   Medicare Annual Wellness (AWV)  11/29/2022   Pneumonia Vaccine 42+ Years old  Completed   INFLUENZA VACCINE  Completed   Hepatitis C Screening  Completed   HPV VACCINES  Aged Out   COLONOSCOPY (Pts 45-72yr Insurance coverage will need to be confirmed)  Discontinued   Immunization History  Administered Date(s) Administered   Fluad Quad(high Dose 65+) 10/26/2019, 11/01/2020, 11/08/2021   Influenza Split 10/11/2010, 11/06/2011   Influenza Whole 12/01/2008, 12/11/2009, 09/30/2014   Influenza, High Dose Seasonal PF 11/27/2016, 11/21/2017, 11/03/2018   Influenza,inj,Quad PF,6+ Mos 11/19/2013, 09/28/2015   Influenza-Unspecified 09/29/2014, 11/21/2016   PFIZER(Purple Top)SARS-COV-2 Vaccination 02/17/2019, 03/10/2019, 11/18/2019, 07/28/2020   Pneumococcal Conjugate-13 11/19/2013   Pneumococcal Polysaccharide-23 11/14/2011   Td 12/01/2008   Tdap 09/28/2008, 10/11/2010   Zoster, Live 10/11/2010    These are the patient goals that we discussed:  Goals Addressed               This Visit's Progress     Patient Stated (pt-stated)        11/28/2021 AWV Goal: Exercise for General Health  Patient will verbalize understanding of the benefits of increased physical activity: Exercising regularly is important. It will improve your overall fitness, flexibility, and endurance. Regular exercise also will improve your overall health. It can help you control your weight, reduce stress, and improve  your bone density. Over the next year, patient will increase physical activity as tolerated with a goal of at least 150 minutes of moderate physical activity per week.  You can tell that you are exercising at a moderate intensity if your heart starts beating faster and you start breathing faster but can still hold a conversation. Moderate-intensity exercise ideas include: Walking 1 mile (1.6 km) in about 15 minutes Biking Hiking Golfing Dancing Water aerobics Patient will verbalize understanding of everyday activities that increase physical activity by providing examples like the following: Yard work, such as: PSales promotion account executiveGardening Washing windows or floors Patient will be able to explain general safety guidelines for exercising:  Before you start a new exercise program, talk with your health care provider. Do not exercise so much that you hurt yourself, feel dizzy, or get very short of breath. Wear comfortable clothes and wear shoes with good support. Drink plenty of water while you exercise to prevent dehydration or heat stroke. Work out until your breathing and your heartbeat get faster.          This is a list of Health Maintenance Items that are overdue or due now: Td vaccine Shingles vaccine    Orders/Referrals Placed Today: No orders of the defined types were placed in this encounter.  (Contact our referral department at [phone] you have not spoken with someone about your referral appointment within the next 5 days)    Follow-up Plan Follow-up with MHali Marry MD as planned Schedule tetanus and shingles vaccine at the pharmacy. Medicare wellness visit in one year.  AVS printed and given to patient.      Health Maintenance, Male Adopting a healthy lifestyle and getting preventive care are important in promoting health and wellness. Ask your health care  provider about: The right schedule for you to have regular tests and exams. Things you can do on your own to prevent diseases and keep yourself healthy. What should I know about diet, weight, and exercise? Eat a healthy diet  Eat a diet that includes plenty of vegetables, fruits, low-fat dairy products, and lean protein. Do not eat a lot of foods that are high in solid fats, added sugars, or sodium. Maintain a healthy weight Body mass index (BMI) is a measurement that can be used to identify possible weight problems. It estimates body fat based on height and weight. Your health care provider can help determine your BMI and help you achieve or maintain a healthy weight. Get regular exercise Get regular exercise. This is one of the most important things you can do for your health. Most adults should: Exercise for at least 150 minutes each week. The exercise should increase your heart rate and make you sweat (moderate-intensity exercise). Do strengthening exercises at least twice a week. This is in addition to the moderate-intensity exercise. Spend less time sitting. Even light physical activity can be beneficial. Watch cholesterol and blood lipids Have your blood tested for lipids and cholesterol at 77 years of age, then have this test every 5 years. You may need to have your cholesterol levels checked more often if: Your lipid or cholesterol levels are high. You are older than 77 years of age. You are at high risk for heart disease. What should I know about cancer screening? Many types of cancers can be detected early and may often be prevented. Depending on your health history and family history, you may need to have cancer screening at various ages. This may include screening for: Colorectal cancer. Prostate cancer. Skin cancer. Lung cancer. What should I know about heart disease, diabetes, and high blood pressure? Blood pressure and heart disease High blood pressure causes heart  disease and increases the risk of stroke. This is more likely to develop in people who have high blood pressure readings or are overweight. Talk with your health care provider about your target blood pressure readings. Have your blood pressure checked: Every 3-5 years if you are 30-11 years of age. Every year if you are 40 years old or older. If you are between the ages of 61 and 58 and are a current or former smoker, ask your health care provider if you should have a one-time screening for abdominal aortic aneurysm (AAA). Diabetes Have regular diabetes screenings. This checks your fasting blood sugar level. Have the screening done: Once every three years after age 62 if you are at a normal weight and have a low risk for diabetes. More often and at a younger age if you are overweight or have a high risk for diabetes. What should I know about preventing infection? Hepatitis B If you have a higher risk for hepatitis B, you should be screened for this virus. Talk with your health care provider to find out if you are at risk for hepatitis B infection. Hepatitis C Blood testing is recommended for: Everyone born from [birth year] through 1966. Anyone with known risk factors for hepatitis C. Sexually transmitted infections (STIs) You should be screened each year for STIs, including gonorrhea and chlamydia, if: You are sexually active and are younger than 77 years  of age. You are older than 77 years of age and your health care provider tells you that you are at risk for this type of infection. Your sexual activity has changed since you were last screened, and you are at increased risk for chlamydia or gonorrhea. Ask your health care provider if you are at risk. Ask your health care provider about whether you are at high risk for HIV. Your health care provider may recommend a prescription medicine to help prevent HIV infection. If you choose to take medicine to prevent HIV, you should first get tested for HIV.  You should then be tested every 3 months for as long as you are taking the medicine. Follow these instructions at home: Alcohol use Do not drink alcohol if your health care provider tells you not to drink. If you drink alcohol: Limit how much you have to 0-2 drinks a day. Know how much alcohol is in your drink. In the [place] one drink equals one 12 oz bottle of beer (355 mL), one 5 oz glass of wine (148 mL), or one 1 oz glass of hard liquor (44 mL). Lifestyle Do not use any products that contain nicotine or tobacco. These products include cigarettes, chewing tobacco, and vaping devices, such as e-cigarettes. If you need help quitting, ask your health care provider. Do not use street drugs. Do not share needles. Ask your health care provider for help if you need support or information about quitting drugs. General instructions Schedule regular health, dental, and eye exams. Stay current with your vaccines. Tell your health care provider if: You often feel depressed. You have ever been abused or do not feel safe at home. Summary Adopting a healthy lifestyle and getting preventive care are important in promoting health and wellness. Follow your health care provider's instructions about healthy diet, exercising, and getting tested or screened for diseases. Follow your health care provider's instructions on monitoring your cholesterol and blood pressure. This information is not intended to replace advice given to you by your health care provider. Make sure you discuss any questions you have with your health care provider. Document Revised: 06/05/2020 Document Reviewed: 06/05/2020 Elsevier Patient Education  [place].

## 2021-11-28 NOTE — Progress Notes (Signed)
MEDICARE ANNUAL WELLNESS VISIT  11/28/2021  Subjective:  Jeremy Woodward is a 76 y.o. male patient of Metheney, Rene Kocher, MD who had a Medicare Annual Wellness Visit today. Jeremy Woodward is Retired and lives with their spouse. he has 1 children. he reports that he is socially active and does interact with friends/family regularly. he is minimally physically active and enjoys yard work, walking and reading.  Patient Care Team: Jeremy Marry, MD as PCP - General (Family Medicine) Jeremy Breed Denice Bors, MD as PCP - Cardiology (Cardiology) Jeremy Breed Denice Bors, MD as Consulting Physician (Cardiology) Jeremy Amen, MD as Referring Physician (Psychiatry) Jeremy Fam, MD as Consulting Physician (Ophthalmology)     11/28/2021   10:46 AM 11/22/2020    9:14 AM 07/07/2017    9:00 AM 08/01/2013    8:12 PM 06/29/2013    8:22 PM  Advanced Directives  Does Patient Have a Medical Advance Directive? No No No Patient would not like information Patient does not have advance directive;Patient would not like information  Would patient like information on creating a medical advance directive? No - Patient declined No - Patient declined No - Patient declined      Hospital Utilization Over the Past 12 Months: # of hospitalizations or ER visits: 0 # of surgeries: 0  Review of Systems    Patient reports that his overall health is unchanged when compared to last year.  Review of Systems: History obtained from chart review and the patient  All other systems negative.  Pain Assessment Pain : 0-10 Pain Score: 5  Pain Type: Chronic pain Pain Location: Back Pain Orientation: Upper Pain Descriptors / Indicators: Constant Pain Onset: More than a month ago Pain Frequency: Constant Pain Relieving Factors: NSAIDs; heat, ice and massage  Pain Relieving Factors: NSAIDs; heat, ice and massage  Current Medications & Allergies (verified) Allergies as of 11/28/2021   No Known Allergies      Medication  List        Accurate as of November 28, 2021 10:58 AM. If you have any questions, ask your nurse or doctor.          ACIDOPHILUS LACTOBACILLUS PO Take by mouth.   AMBULATORY NON FORMULARY MEDICATION Medication Name: needs 2 Liter supplement overnight oxygen for his CPAP.  Dx nocturnal hypoxemia.   aspirin 81 MG tablet Take 81 mg by mouth daily.   cholecalciferol 25 MCG (1000 UNIT) tablet Commonly known as: VITAMIN D3   diclofenac Sodium 1 % Gel Commonly known as: VOLTAREN diclofenac 1 % topical gel  APPLY 2 GRAMS TO THE AFFECTED AREA(S) BY TOPICAL ROUTE 4 TIMES PER DAY   losartan 50 MG tablet Commonly known as: COZAAR TAKE 1 TABLET BY MOUTH EVERY DAY   Misc. Devices Misc CPAP. Please add 2L of oxygen.  Overnight O2 study confirmed hypoxia on CPAP.   pantoprazole 40 MG tablet Commonly known as: PROTONIX Take 1 tablet (40 mg total) by mouth daily.   rosuvastatin 40 MG tablet Commonly known as: CRESTOR Take 1 tablet (40 mg total) by mouth daily.   sertraline 50 MG tablet Commonly known as: ZOLOFT Take 1.5 tablets (75 mg total) by mouth daily.   VITAMIN B 12 PO Take 1,000 mcg by mouth daily.   vitamin C 1000 MG tablet   zolpidem 10 MG tablet Commonly known as: AMBIEN TAKE 1/2 TO 1 TABLET BY MOUTH EVERY NIGHT AT BEDTIME AS NEEDED        History (reviewed): Past Medical History:  Diagnosis Date  Anxiety    Arthritis    BPH (benign prostatic hypertrophy)    CAD (coronary artery disease) 2010   Blockage in artery/ widow maker   Cataract    CHF (congestive heart failure) (HCC)    Depression    Diarrhea    GERD (gastroesophageal reflux disease)    Hyperlipidemia    Hypertension    Obesity    OSA on CPAP    Oxygen deficiency    Pleurisy    Sleep apnea    Past Surgical History:  Procedure Laterality Date   LEFT AND RIGHT HEART CATHETERIZATION WITH CORONARY ANGIOGRAM N/A 03/05/2011   Procedure: LEFT AND RIGHT HEART CATHETERIZATION WITH CORONARY  ANGIOGRAM;  Surgeon: Jeremy Dresser, MD;  Location: [facility] CATH LAB;  Service: Cardiovascular;  Laterality: N/A;   LUNG DECORTICATION  1996   right with drainage utilizing VAT for empyema   TONSILLECTOMY     Family History  Problem Relation Age of Onset   Cancer Father 39       liver and lung   Heart disease Mother    Ulcers Mother    Hypertension Mother    Aneurysm Mother    Miscarriages / Stillbirths Mother    Obesity Mother    Hyperlipidemia Sister    Hypertension Sister    Colon cancer Maternal Grandmother        late 11's when dx    Hypertension Brother    Esophageal cancer Neg Hx    Stomach cancer Neg Hx    Rectal cancer Neg Hx    Social History   Socioeconomic History   Marital status: Married    Spouse name: Jeremy Woodward   Number of children: 1   Years of education: 13   Highest education level: Some college, no degree  Occupational History   Occupation: Retired  Tobacco Use   Smoking status: Never   Smokeless tobacco: Never   Tobacco comments:    10/25/19 no smoking at all  Vaping Use   Vaping Use: Never used  Substance and Sexual Activity   Alcohol use: No   Drug use: No   Sexual activity: Not Currently    Birth control/protection: None  Other Topics Concern   Not on file  Social History Narrative   Lives with his wife. He has one child and one step-child. He enjoys yard work, walking and reading.   Social Determinants of Health   Financial Resource Strain: Low Risk  (11/28/2021)   Overall Financial Resource Strain (CARDIA)    Difficulty of Paying Living Expenses: Not hard at all  Food Insecurity: No Food Insecurity (11/28/2021)   Hunger Vital Sign    Worried About Running Out of Food in the Last Year: Never true    Ran Out of Food in the Last Year: Never true  Transportation Needs: No Transportation Needs (11/28/2021)   PRAPARE - Hydrologist (Medical): No    Lack of Transportation (Non-Medical): No  Physical Activity:  Insufficiently Active (11/28/2021)   Exercise Vital Sign    Days of Exercise per Week: 2 days    Minutes of Exercise per Session: 20 min  Stress: No Stress Concern Present (11/28/2021)   [place]    Feeling of Stress : Not at all  Social Connections: [place] (11/28/2021)   Social Connection and Isolation Panel [NHANES]    Frequency of Communication with Friends and Family: More than three times a week  Frequency of Social Gatherings with Friends and Family: More than three times a week    Attends Religious Services: More than 4 times per year    Active Member of Clubs or Organizations: Yes    Attends Archivist Meetings: More than 4 times per year    Marital Status: Married    Activities of Daily Living    11/28/2021    8:43 AM  In your present state of health, do you have any difficulty performing the following activities:  Hearing? 1  Vision? 1  Difficulty concentrating or making decisions? 1  Walking or climbing stairs? 1  Dressing or bathing? 0  Doing errands, shopping? 0  Preparing Food and eating ? N  Using the Toilet? N  In the past six months, have you accidently leaked urine? Y  Do you have problems with loss of bowel control? N  Managing your Medications? N  Managing your Finances? N  Housekeeping or managing your Housekeeping? N    Patient Education/Literacy How often do you need to have someone help you when you read instructions, pamphlets, or other written materials from your doctor or pharmacy?: 1 - Never What is the last grade level you completed in school?: some college  Exercise Current Exercise Habits: Home exercise routine, Type of exercise: walking, Time (Minutes): 20, Frequency (Times/Week): 2, Weekly Exercise (Minutes/Week): 40, Intensity: Mild, Exercise limited by: neurologic condition(s) (gait imbalance)  Diet Patient reports consuming 2 meals a day and 1  snack(s) a day Patient reports that his primary diet is: Regular Patient reports that she does have regular access to food.   Depression Screen    11/28/2021   10:38 AM 11/08/2021    9:10 AM 05/09/2021    9:41 AM 11/22/2020    9:14 AM 11/01/2020    9:06 AM 05/02/2020    8:33 AM 08/12/2019    8:36 AM  PHQ 2/9 Scores  PHQ - 2 Score '1 1 1 '$ 0 0 2 1  PHQ- 9 Score '6 5 6 1 5 5 2     '$ Fall Risk    11/28/2021    8:43 AM 11/08/2021    9:09 AM 05/09/2021    9:41 AM 11/22/2020    9:14 AM 11/01/2020    9:05 AM  Fall Risk   Falls in the past year? 0 0 0 0 0  Number falls in past yr: 0 0 0 0 0  Injury with Fall? 0 0 0 0 0  Risk for fall due to : Impaired balance/gait No Fall Risks No Fall Risks Impaired balance/gait No Fall Risks  Follow up Falls evaluation completed Falls evaluation completed Falls prevention discussed Falls evaluation completed;Education provided;Falls prevention discussed Falls prevention discussed;Falls evaluation completed     Objective:   BP (!) 147/54 (BP Location: Left Arm, Patient Position: Sitting, Cuff Size: Large)   Pulse 65   Ht '6\' 3"'$  (1.905 m)   Wt 266 lb (120.7 kg)   SpO2 98%   BMI 33.25 kg/m   Last Weight  Most recent update: 11/28/2021 10:35 AM    Weight  120.7 kg (266 lb)             Body mass index is 33.25 kg/m.  Hearing/Vision  Jaidin did not have difficulty with hearing/understanding during the face-to-face interview Alem did not have difficulty with his vision during the face-to-face interview Reports that he has had a formal eye exam by an eye care professional within the past year Reports that  he has not had a formal hearing evaluation within the past year  Cognitive Function:    11/28/2021   10:51 AM 11/22/2020    9:53 AM 07/07/2017    8:55 AM 01/15/2016    9:44 AM  6CIT Screen  What Year? 0 points 0 points 0 points 0 points  What month? 0 points 0 points 0 points 0 points  What time? 0 points 0 points 0 points 0 points  Count back  from 20 0 points 0 points 0 points 0 points  Months in reverse 0 points 0 points 0 points 0 points  Repeat phrase 0 points 0 points 2 points 0 points  Total Score 0 points 0 points 2 points 0 points    Normal Cognitive Function Screening: Yes (Normal:0-7, Significant for Dysfunction: >8)  Immunization & Health Maintenance Record Immunization History  Administered Date(s) Administered   Fluad Quad(high Dose 65+) 10/26/2019, 11/01/2020, 11/08/2021   Influenza Split 10/11/2010, 11/06/2011   Influenza Whole 12/01/2008, 12/11/2009, 09/30/2014   Influenza, High Dose Seasonal PF 11/27/2016, 11/21/2017, 11/03/2018   Influenza,inj,Quad PF,6+ Mos 11/19/2013, 09/28/2015   Influenza-Unspecified 09/29/2014, 11/21/2016   PFIZER(Purple Top)SARS-COV-2 Vaccination 02/17/2019, 03/10/2019, 11/18/2019, 07/28/2020   Pneumococcal Conjugate-13 11/19/2013   Pneumococcal Polysaccharide-23 11/14/2011   Td 12/01/2008   Tdap 09/28/2008, 10/11/2010   Zoster, Live 10/11/2010    Health Maintenance  Topic Date Due   COVID-19 Vaccine (5 - Pfizer risk series) 05/10/2022 (Originally 09/22/2020)   Zoster Vaccines- Shingrix (1 of 2) 05/10/2022 (Originally 06/29/1964)   TETANUS/TDAP  11/29/2022 (Originally 10/10/2020)   Medicare Annual Wellness (AWV)  11/29/2022   Pneumonia Vaccine 56+ Years old  Completed   INFLUENZA VACCINE  Completed   Hepatitis C Screening  Completed   HPV VACCINES  Aged Out   COLONOSCOPY (Pts 45-28yr Insurance coverage will need to be confirmed)  Discontinued       Assessment  This is a routine wellness examination for [employer]  Health Maintenance: Due or Overdue There are no preventive care reminders to display for this patient.   JPamelia Hoitdoes not need a referral for Community Assistance: Care Management:   no Social Work:    no Prescription Assistance:  no Nutrition/Diabetes Education:  no   Plan:  Personalized Goals  Goals Addressed               This  Visit's Progress     Patient Stated (pt-stated)        11/28/2021 AWV Goal: Exercise for General Health  Patient will verbalize understanding of the benefits of increased physical activity: Exercising regularly is important. It will improve your overall fitness, flexibility, and endurance. Regular exercise also will improve your overall health. It can help you control your weight, reduce stress, and improve your bone density. Over the next year, patient will increase physical activity as tolerated with a goal of at least 150 minutes of moderate physical activity per week.  You can tell that you are exercising at a moderate intensity if your heart starts beating faster and you start breathing faster but can still hold a conversation. Moderate-intensity exercise ideas include: Walking 1 mile (1.6 km) in about 15 minutes Biking Hiking Golfing Dancing Water aerobics Patient will verbalize understanding of everyday activities that increase physical activity by providing examples like the following: Yard work, such as: PSales promotion account executiveGardening Washing windows or floors Patient will be able to explain  general safety guidelines for exercising:  Before you start a new exercise program, talk with your health care provider. Do not exercise so much that you hurt yourself, feel dizzy, or get very short of breath. Wear comfortable clothes and wear shoes with good support. Drink plenty of water while you exercise to prevent dehydration or heat stroke. Work out until your breathing and your heartbeat get faster.        Personalized Health Maintenance & Screening Recommendations  Td vaccine Shingles vaccine  Lung Cancer Screening Recommended: no (Low Dose CT Chest recommended if Age 53-80 years, 30 pack-year currently smoking OR have quit w/in past 15 years) Hepatitis C Screening recommended: no HIV Screening  recommended: no  Advanced Directives: Written information was not given per the patient's request.  Referrals & Orders No orders of the defined types were placed in this encounter.   Follow-up Plan Follow-up with Jeremy Marry, MD as planned Schedule tetanus and shingles vaccine at the pharmacy. Medicare wellness visit in one year.  AVS printed and given to patient.   I have personally reviewed and noted the following in the patient's chart:   Medical and social history Use of alcohol, tobacco or illicit drugs  Current medications and supplements Functional ability and status Nutritional status Physical activity Advanced directives List of other physicians Hospitalizations, surgeries, and ER visits in previous 12 months Vitals Screenings to include cognitive, depression, and falls Referrals and appointments  In addition, I have reviewed and discussed with patient certain preventive protocols, quality metrics, and best practice recommendations. A written personalized care plan for preventive services as well as general preventive health recommendations were provided to patient.     Tinnie Gens, RN BSN   11/28/2021

## 2021-11-29 DIAGNOSIS — G4734 Idiopathic sleep related nonobstructive alveolar hypoventilation: Secondary | ICD-10-CM | POA: Diagnosis not present

## 2021-11-29 DIAGNOSIS — R0902 Hypoxemia: Secondary | ICD-10-CM | POA: Diagnosis not present

## 2021-12-11 ENCOUNTER — Encounter: Payer: Self-pay | Admitting: Family Medicine

## 2021-12-11 ENCOUNTER — Ambulatory Visit (INDEPENDENT_AMBULATORY_CARE_PROVIDER_SITE_OTHER): Payer: Medicare HMO | Admitting: Family Medicine

## 2021-12-11 VITALS — BP 129/50 | HR 61 | Ht 75.0 in | Wt 265.0 lb

## 2021-12-11 DIAGNOSIS — R69 Illness, unspecified: Secondary | ICD-10-CM | POA: Diagnosis not present

## 2021-12-11 DIAGNOSIS — R2689 Other abnormalities of gait and mobility: Secondary | ICD-10-CM | POA: Diagnosis not present

## 2021-12-11 DIAGNOSIS — F324 Major depressive disorder, single episode, in partial remission: Secondary | ICD-10-CM

## 2021-12-11 DIAGNOSIS — J019 Acute sinusitis, unspecified: Secondary | ICD-10-CM | POA: Diagnosis not present

## 2021-12-11 MED ORDER — AMOXICILLIN-POT CLAVULANATE 875-125 MG PO TABS: 1.0000 | ORAL_TABLET | Freq: Two times a day (BID) | ORAL | 0 refills | Status: DC

## 2021-12-11 NOTE — Assessment & Plan Note (Signed)
We discussed working on exercises specific for balance such as tai chi, yoga, and Silver sneakers.  He says he remembers some of the exercises that he did a couple years ago for Silver sneakers.

## 2021-12-11 NOTE — Patient Instructions (Signed)
OK to decrease the sertraline back to '50mg'$  daily.

## 2021-12-11 NOTE — Progress Notes (Signed)
Pt reports that since the increase of the Sertraline he has experienced loose stools and felt tired.

## 2021-12-11 NOTE — Assessment & Plan Note (Signed)
Okay to decrease sertraline back down to 50 mg since he had side effects on the 75 and did not feel like it was really helping with mood.  He is okay with just staying where he is at.  Just encouraged him to continue to interact socially.  He is very involved in his church.

## 2021-12-11 NOTE — Progress Notes (Signed)
Established Patient Office Visit  Subjective   Patient ID: Jeremy Woodward, male    DOB: 1945-02-17  Age: 76 y.o. MRN: [id number]  Chief Complaint  Patient presents with   balance issues    HPI  Sinus congestion and chest congestion x 3 weeks. + cough, drainage.  No fever. No sinus pain.  Occ ear pain.  No ST.  Not improving.    He is here today for follow-up to discuss his balance.  He had mentioned it briefly at his last visit 4 weeks ago but we were addressing other medical issues at that time so had encouraged him to come back so that we could discuss it a little bit further.  Patient to give turns quickly he will feel a little dizzy.  Has had vertigo in the past.  We had bumped up his sertraline to 75 mg when I last saw him because of some increased feelings of apathy and decreased motivation.  He feels more tired and sedated and is having more loose stools since we bumped up the medication.  The abnormal nail on his finger does not seem to be getting better so he is at the point that he would like to have a partial nail removal.    ROS    Objective:     BP (!) 129/50   Pulse 61   Ht _0  (1.905 m)   Wt 265 lb (120.2 kg)   SpO2 97%   BMI 33.12 kg/m    Physical Exam Constitutional:      Appearance: He is well-developed.  HENT:     Head: Normocephalic and atraumatic.     Right Ear: External ear normal.     Left Ear: External ear normal.     Nose: Nose normal.  Eyes:     Conjunctiva/sclera: Conjunctivae normal.     Pupils: Pupils are equal, round, and reactive to light.  Neck:     Thyroid: No thyromegaly.  Cardiovascular:     Rate and Rhythm: Normal rate.     Heart sounds: Normal heart sounds.  Pulmonary:     Effort: Pulmonary effort is normal.     Breath sounds: Normal breath sounds.  Musculoskeletal:     Cervical back: Neck supple.  Lymphadenopathy:     Cervical: No cervical adenopathy.  Skin:    General: Skin is warm and dry.  Neurological:      Mental Status: He is alert and oriented to person, place, and time.      No results found for any visits on 12/11/21.    The ASCVD Risk score (Arnett DK, et al., 2019) failed to calculate for the following reasons:   The valid total cholesterol range is 130 to 320 mg/dL    Assessment & Plan:   Problem List Items Addressed This Visit       Other   Depression, major, single episode, in partial remission ([place])    Okay to decrease sertraline back down to 50 mg since he had side effects on the 75 and did not feel like it was really helping with mood.  He is okay with just staying where he is at.  Just encouraged him to continue to interact socially.  He is very involved in his church.      Balance problem - Primary    We discussed working on exercises specific for balance such as tai chi, yoga, and Silver sneakers.  He says he remembers some of the exercises that  he did a couple years ago for Silver sneakers.      Other Visit Diagnoses     Acute non-recurrent sinusitis, unspecified location       Relevant Medications   amoxicillin-clavulanate (AUGMENTIN) 875-125 MG tablet      Acute sinusitis-we will treat with Augmentin.  If not getting better in 1 week then please let us know.  Nail deformity-we did discuss scheduling for possible removal he would just like a partial nail removal.  We can always send it for culture to see if there is a fungal element part of that nail is thickened.  Return in about 5 months (around 05/12/2022) for Hypertension.    Beatrice Lecher, MD

## 2021-12-12 ENCOUNTER — Other Ambulatory Visit: Payer: Self-pay | Admitting: Family Medicine

## 2021-12-12 DIAGNOSIS — F4010 Social phobia, unspecified: Secondary | ICD-10-CM

## 2021-12-12 DIAGNOSIS — F324 Major depressive disorder, single episode, in partial remission: Secondary | ICD-10-CM

## 2021-12-22 ENCOUNTER — Other Ambulatory Visit: Payer: Self-pay | Admitting: Cardiology

## 2021-12-26 ENCOUNTER — Encounter: Payer: Self-pay | Admitting: Family Medicine

## 2021-12-26 DIAGNOSIS — Z125 Encounter for screening for malignant neoplasm of prostate: Secondary | ICD-10-CM

## 2021-12-26 NOTE — Telephone Encounter (Signed)
Ordered this as screening PSA, has not had one in over a year.   Dr. Madilyn Fireman - FYI.

## 2021-12-29 DIAGNOSIS — G4734 Idiopathic sleep related nonobstructive alveolar hypoventilation: Secondary | ICD-10-CM | POA: Diagnosis not present

## 2021-12-29 DIAGNOSIS — R0902 Hypoxemia: Secondary | ICD-10-CM | POA: Diagnosis not present

## 2022-01-01 ENCOUNTER — Ambulatory Visit (INDEPENDENT_AMBULATORY_CARE_PROVIDER_SITE_OTHER): Payer: Medicare HMO | Admitting: Family Medicine

## 2022-01-01 ENCOUNTER — Encounter: Payer: Self-pay | Admitting: Family Medicine

## 2022-01-01 ENCOUNTER — Other Ambulatory Visit: Payer: Self-pay | Admitting: Family Medicine

## 2022-01-01 VITALS — BP 128/50 | HR 62 | Ht 75.0 in | Wt 267.0 lb

## 2022-01-01 DIAGNOSIS — L608 Other nail disorders: Secondary | ICD-10-CM | POA: Diagnosis not present

## 2022-01-01 DIAGNOSIS — Z125 Encounter for screening for malignant neoplasm of prostate: Secondary | ICD-10-CM | POA: Diagnosis not present

## 2022-01-01 DIAGNOSIS — L603 Nail dystrophy: Secondary | ICD-10-CM | POA: Diagnosis not present

## 2022-01-01 NOTE — Progress Notes (Signed)
   Acute Office Visit  Subjective:     Patient ID: Jeremy Woodward, male    DOB: [birth year], 76 y.o.   MRN: [id number]  Chief Complaint  Patient presents with   cut portion fingernail off left middle finger    HPI Patient is in today for partial nail removal.  He has been dealing with a dystrophic nail on his left hand.  Middle finger.  It has been bothering him for quite some time and he was here today to have part of that nail removed he would like to keep part of his nail if possible.  Failed oral terbinafine.  ROS      Objective:    BP (!) 128/50 (BP Location: Right Arm, Patient Position: Sitting, Cuff Size: Large)   Pulse 62   Ht '6\' 3"'$  (1.905 m)   Wt 267 lb 0.6 oz (121.1 kg)   SpO2 96%   BMI 33.38 kg/m     Physical Exam  No results found for any visits on 01/01/22.      Assessment & Plan:   Problem List Items Addressed This Visit   None Visit Diagnoses     Nail deformity    -  Primary      Fingernail removal performed.  Patient tolerated well.  Recommend over-the-counter pain medications for discomfort.  Icing and elevation as needed.  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  Post-operative Diagnosis: Same  Indications: Treatment failure.  Anesthesia: Lidocaine 1% without epinephrine without added sodium bicarbonate  Procedure Details  The risks (including bleeding and infection) and benefits of the  procedure and Verbal informed consent obtained.  After digital block anesthesia was obtained, a tourniquet was applied for hemostasis during the procedure.  After prepping with Betadine, the offending edge of the nail was freed from the nailbed and perionychium, and then split with scissors and removed with  forceps.  All visible granulation tissue is debrided. Antibiotic and bulky dressing was applied.   Findings: Await culture   Complications: none.  Plan: 1. Soak the foot twice  daily. Change dressing twice daily until healed over. 2. Warning signs of infection were reviewed.   3. Recommended that the patient use OTC acetaminophen as needed for pain.  4. Return PRN.   No orders of the defined types were placed in this encounter.   No follow-ups on file.  Beatrice Lecher, MD

## 2022-01-01 NOTE — Addendum Note (Signed)
Addended by: Teddy Spike on: 01/01/2022 03:16 PM   Modules accepted: Orders

## 2022-01-02 LAB — PSA: PSA: 2.08 ng/mL (ref <=4.00)

## 2022-01-02 NOTE — Progress Notes (Signed)
Your lab work is within acceptable range and there are no concerning findings.   ?

## 2022-01-09 DIAGNOSIS — G4733 Obstructive sleep apnea (adult) (pediatric): Secondary | ICD-10-CM | POA: Diagnosis not present

## 2022-01-29 DIAGNOSIS — R0902 Hypoxemia: Secondary | ICD-10-CM | POA: Diagnosis not present

## 2022-01-29 DIAGNOSIS — G4734 Idiopathic sleep related nonobstructive alveolar hypoventilation: Secondary | ICD-10-CM | POA: Diagnosis not present

## 2022-01-30 DIAGNOSIS — M25561 Pain in right knee: Secondary | ICD-10-CM | POA: Diagnosis not present

## 2022-01-30 DIAGNOSIS — M170 Bilateral primary osteoarthritis of knee: Secondary | ICD-10-CM | POA: Diagnosis not present

## 2022-01-30 DIAGNOSIS — M25562 Pain in left knee: Secondary | ICD-10-CM | POA: Diagnosis not present

## 2022-01-31 LAB — CULTURE, FUNGUS WITHOUT SMEAR
CULTURE:: NO GROWTH
MICRO NUMBER:: 14274884
SPECIMEN QUALITY:: ADEQUATE

## 2022-02-01 NOTE — Progress Notes (Signed)
HI [place] they didn't see any fungal growth on your nail, so I do think it is a dystrophic nail, meaning it grows abnormally but not because of a fungus

## 2022-02-06 ENCOUNTER — Other Ambulatory Visit: Payer: Self-pay | Admitting: Family Medicine

## 2022-02-06 DIAGNOSIS — F324 Major depressive disorder, single episode, in partial remission: Secondary | ICD-10-CM

## 2022-02-06 DIAGNOSIS — F4010 Social phobia, unspecified: Secondary | ICD-10-CM

## 2022-02-11 ENCOUNTER — Other Ambulatory Visit: Payer: Self-pay | Admitting: Family Medicine

## 2022-02-11 DIAGNOSIS — F5101 Primary insomnia: Secondary | ICD-10-CM

## 2022-02-15 ENCOUNTER — Other Ambulatory Visit: Payer: Self-pay | Admitting: Family Medicine

## 2022-02-15 DIAGNOSIS — F5101 Primary insomnia: Secondary | ICD-10-CM

## 2022-02-18 ENCOUNTER — Other Ambulatory Visit: Payer: Self-pay | Admitting: Family Medicine

## 2022-02-18 DIAGNOSIS — F5101 Primary insomnia: Secondary | ICD-10-CM

## 2022-02-19 ENCOUNTER — Encounter: Payer: Self-pay | Admitting: Family Medicine

## 2022-02-19 NOTE — Telephone Encounter (Signed)
It looks like the last prescription was printed.

## 2022-03-01 DIAGNOSIS — R0902 Hypoxemia: Secondary | ICD-10-CM | POA: Diagnosis not present

## 2022-03-01 DIAGNOSIS — G4734 Idiopathic sleep related nonobstructive alveolar hypoventilation: Secondary | ICD-10-CM | POA: Diagnosis not present

## 2022-03-30 DIAGNOSIS — R0902 Hypoxemia: Secondary | ICD-10-CM | POA: Diagnosis not present

## 2022-03-30 DIAGNOSIS — G4734 Idiopathic sleep related nonobstructive alveolar hypoventilation: Secondary | ICD-10-CM | POA: Diagnosis not present

## 2022-04-10 ENCOUNTER — Other Ambulatory Visit: Payer: Self-pay | Admitting: *Deleted

## 2022-04-10 DIAGNOSIS — L608 Other nail disorders: Secondary | ICD-10-CM

## 2022-04-11 DIAGNOSIS — G4733 Obstructive sleep apnea (adult) (pediatric): Secondary | ICD-10-CM | POA: Diagnosis not present

## 2022-04-15 ENCOUNTER — Encounter: Payer: Medicare HMO | Admitting: Family Medicine

## 2022-04-15 ENCOUNTER — Other Ambulatory Visit: Payer: Self-pay | Admitting: Family Medicine

## 2022-04-24 ENCOUNTER — Encounter: Payer: Self-pay | Admitting: Family Medicine

## 2022-04-24 ENCOUNTER — Ambulatory Visit (INDEPENDENT_AMBULATORY_CARE_PROVIDER_SITE_OTHER): Payer: Medicare HMO | Admitting: Family Medicine

## 2022-04-24 VITALS — BP 126/52 | HR 70 | Ht 75.0 in | Wt 271.0 lb

## 2022-04-24 DIAGNOSIS — R7301 Impaired fasting glucose: Secondary | ICD-10-CM

## 2022-04-24 DIAGNOSIS — G4700 Insomnia, unspecified: Secondary | ICD-10-CM | POA: Diagnosis not present

## 2022-04-24 DIAGNOSIS — N1831 Chronic kidney disease, stage 3a: Secondary | ICD-10-CM | POA: Diagnosis not present

## 2022-04-24 DIAGNOSIS — M549 Dorsalgia, unspecified: Secondary | ICD-10-CM

## 2022-04-24 DIAGNOSIS — I10 Essential (primary) hypertension: Secondary | ICD-10-CM | POA: Diagnosis not present

## 2022-04-24 MED ORDER — MELOXICAM 15 MG PO TABS: 15.0000 mg | ORAL_TABLET | Freq: Every day | ORAL | 2 refills | Status: DC | PRN

## 2022-04-24 MED ORDER — ESZOPICLONE 3 MG PO TABS: 3.0000 mg | ORAL_TABLET | Freq: Every day | ORAL | 0 refills | Status: DC

## 2022-04-24 NOTE — Addendum Note (Signed)
Addended by: Beatrice Lecher D on: 04/24/2022 09:20 AM   Modules accepted: Orders

## 2022-04-24 NOTE — Patient Instructions (Signed)
Please remember to get your second shingles vaccine at your local pharmacy when you are able to. Also try to get your Tdap updated as well when you can.

## 2022-04-24 NOTE — Progress Notes (Addendum)
Complete physical exam  Patient: Jeremy Woodward   DOB: July 05, 1945   77 y.o. Male  MRN: [id number]  Subjective:    Chief Complaint  Patient presents with   Annual Exam    Jeremy Woodward is a 77 y.o. male who presents today for a complete physical exam. He reports consuming a general diet. The patient does not participate in regular exercise at present. He generally feels well. He reports sleeping poorly. He does not have additional problems to discuss today.   Is also been dealing with some upper back pain recently.  He took some old meloxicam and feels like it was more helpful than the ibuprofen.  He used to do some weights and stretches and exercises but has not been doing them recently he has been much more sedentary over the winter and has gained a little bit of weight.  He has noticed he is a little bit more short of breath with activities but denies any chest pain with activity.   Most recent fall risk assessment:    04/24/2022    8:58 AM  Fall Risk   Falls in the past year? 0  Number falls in past yr: 0  Injury with Fall? 0  Risk for fall due to : No Fall Risks  Follow up Falls evaluation completed     Most recent depression screenings:    01/01/2022    9:24 AM 11/28/2021   10:38 AM  PHQ 2/9 Scores  PHQ - 2 Score 0 1  PHQ- 9 Score  6        Patient Care Team: [name], MD as PCP - General (Family Medicine) Stanford Breed Denice Bors, MD as PCP - Cardiology (Cardiology) Stanford Breed Denice Bors, MD as Consulting Physician (Cardiology) Amil Amen, MD as Referring Physician (Psychiatry) Monna Fam, MD as Consulting Physician (Ophthalmology)   Outpatient Medications Prior to Visit  Medication Sig   aspirin 81 MG tablet Take 81 mg by mouth daily.   ACIDOPHILUS LACTOBACILLUS PO Take by mouth.   AMBULATORY NON FORMULARY MEDICATION Medication Name: needs 2 Liter supplement overnight oxygen for his CPAP.  Dx nocturnal hypoxemia.   Ascorbic Acid (VITAMIN C)  1000 MG tablet    cholecalciferol (VITAMIN D3) 25 MCG (1000 UNIT) tablet    Cyanocobalamin (VITAMIN B 12 PO) Take 1,000 mcg by mouth daily.   diclofenac Sodium (VOLTAREN) 1 % GEL diclofenac 1 % topical gel  APPLY 2 GRAMS TO THE AFFECTED AREA(S) BY TOPICAL ROUTE 4 TIMES PER DAY   losartan (COZAAR) 50 MG tablet TAKE 1 TABLET BY MOUTH EVERY DAY   Misc. Devices MISC CPAP. Please add 2L of oxygen.  Overnight O2 study confirmed hypoxia on CPAP.   pantoprazole (PROTONIX) 40 MG tablet Take 1 tablet (40 mg total) by mouth daily.   rosuvastatin (CRESTOR) 40 MG tablet TAKE 1 TABLET BY MOUTH DAILY   sertraline (ZOLOFT) 50 MG tablet Take 1 tablet (50 mg total) by mouth daily.   [DISCONTINUED] amoxicillin-clavulanate (AUGMENTIN) 875-125 MG tablet Take 1 tablet by mouth 2 (two) times daily.   [DISCONTINUED] zolpidem (AMBIEN) 10 MG tablet TAKE 1/2 TO 1 TABLET BY MOUTH EVERY NIGHT AT BEDTIME AS NEEDED   No facility-administered medications prior to visit.    ROS        Objective:     BP (!) 126/52   Pulse 70   Ht 6\' 3"  (1.905 m)   Wt 271 lb (122.9 kg)   SpO2 97%   BMI  33.87 kg/m    Physical Exam Constitutional:      Appearance: He is well-developed.  HENT:     Head: Normocephalic and atraumatic.  Cardiovascular:     Rate and Rhythm: Normal rate and regular rhythm.     Heart sounds: Normal heart sounds.  Pulmonary:     Effort: Pulmonary effort is normal.     Breath sounds: Normal breath sounds.  Skin:    General: Skin is warm and dry.  Neurological:     Mental Status: He is alert and oriented to person, place, and time.  Psychiatric:        Behavior: Behavior normal.      No results found for any visits on 04/24/22.     Assessment & Plan:    Routine Health Maintenance and Physical Exam  Immunization History  Administered Date(s) Administered   Fluad Quad(high Dose 65+) 10/26/2019, 11/01/2020, 11/08/2021   Influenza Split 10/11/2010, 11/06/2011   Influenza Whole  12/01/2008, 12/11/2009, 09/30/2014   Influenza, High Dose Seasonal PF 11/27/2016, 11/21/2017, 11/03/2018   Influenza,inj,Quad PF,6+ Mos 11/19/2013, 09/28/2015   Influenza-Unspecified 09/29/2014, 11/21/2016   PFIZER(Purple Top)SARS-COV-2 Vaccination 02/17/2019, 03/10/2019, 11/18/2019, 07/28/2020   Pneumococcal Conjugate-13 11/19/2013   Pneumococcal Polysaccharide-23 11/14/2011   Rsv, Bivalent, Protein Subunit Rsvpref,pf Evans Lance) 02/11/2022   Td 12/01/2008   Tdap 09/28/2008, 10/11/2010   Zoster Recombinat (Shingrix) 02/11/2022   Zoster, Live 10/11/2010    Health Maintenance  Topic Date Due   DTaP/Tdap/Td (4 - Td or Tdap) 10/10/2020   Zoster Vaccines- Shingrix (2 of 2) 04/08/2022   COVID-19 Vaccine (5 - 2023-24 season) 05/10/2023 (Originally 09/28/2021)   Medicare Annual Wellness (AWV)  11/29/2022   Pneumonia Vaccine 58+ Years old  Completed   INFLUENZA VACCINE  Completed   Hepatitis C Screening  Completed   HPV VACCINES  Aged Out   COLONOSCOPY (Pts 45-81yrs Insurance coverage will need to be confirmed)  Discontinued    Keep up a regular exercise program and make sure you are eating a healthy diet Try to eat 4 servings of dairy a day, or if you are lactose intolerant take a calcium with vitamin D daily.  Your vaccines are up to date.  He is due for second shingles vaccine and the first 1 in January.  Also due for updated Tdap.  Both of these are covered with his Medicare part D at the local pharmacy.   Discussed health benefits of physical activity, and encouraged him to engage in regular exercise appropriate for his age and condition.  Problem List Items Addressed This Visit       Cardiovascular and Mediastinum   Benign essential hypertension   Relevant Orders   Hemoglobin A1c   COMPLETE METABOLIC PANEL WITH GFR     Endocrine   IFG (impaired fasting glucose)   Relevant Orders   Hemoglobin A1c   COMPLETE METABOLIC PANEL WITH GFR     Genitourinary   CKD (chronic kidney  disease) stage 3, GFR 30-59 ml/min (HCC)   Relevant Orders   Hemoglobin A1c   COMPLETE METABOLIC PANEL WITH GFR     Other   Insomnia - Primary (Chronic)   Relevant Medications   eszopiclone 3 MG TABS   Other Visit Diagnoses     Upper back pain       Relevant Medications   meloxicam (MOBIC) 15 MG tablet      Return in about 6 months (around 10/25/2022) for sleep and Mood , Hypertension.  Over prescription to use meloxicam  as needed for upper back pain and encouraged him not to use it every day we did discuss that it is very hard on the stomach and the kidneys.  But he does feel like it is more effective than ibuprofen for his back.  Also encouraged him to get back into doing his stretching and exercising and work on getting moving in general to help decrease any deconditioning.   Keep up a regular exercise program and make sure you are eating a healthy diet Try to eat 4 servings of dairy a day, or if you are lactose intolerant take a calcium with vitamin D daily.  Your vaccines are up to date.    Beatrice Lecher, MD

## 2022-04-25 ENCOUNTER — Telehealth: Payer: Self-pay | Admitting: Family Medicine

## 2022-04-25 LAB — COMPLETE METABOLIC PANEL WITH GFR
AG Ratio: 2 (calc) (ref 1.0–2.5)
ALT: 32 U/L (ref 9–46)
AST: 27 U/L (ref 10–35)
Albumin: 4.4 g/dL (ref 3.6–5.1)
Alkaline phosphatase (APISO): 52 U/L (ref 35–144)
BUN: 23 mg/dL (ref 7–25)
CO2: 26 mmol/L (ref 20–32)
Calcium: 9.7 mg/dL (ref 8.6–10.3)
Chloride: 106 mmol/L (ref 98–110)
Creat: 1.25 mg/dL (ref 0.70–1.28)
Globulin: 2.2 g/dL (calc) (ref 1.9–3.7)
Glucose, Bld: 97 mg/dL (ref 65–99)
Potassium: 4.6 mmol/L (ref 3.5–5.3)
Sodium: 142 mmol/L (ref 135–146)
Total Bilirubin: 0.5 mg/dL (ref 0.2–1.2)
Total Protein: 6.6 g/dL (ref 6.1–8.1)
eGFR: 59 mL/min/{1.73_m2} — ABNORMAL LOW (ref >=60)

## 2022-04-25 LAB — HEMOGLOBIN A1C
Hgb A1c MFr Bld: 5.9 % of total Hgb — ABNORMAL HIGH (ref <5.7)
Mean Plasma Glucose: 123 mg/dL
eAG (mmol/L): 6.8 mmol/L

## 2022-04-25 MED ORDER — NIRMATRELVIR/RITONAVIR (PAXLOVID) TABLET (RENAL DOSING): 2.0000 | ORAL_TABLET | Freq: Two times a day (BID) | ORAL | 0 refills | Status: AC

## 2022-04-25 NOTE — Telephone Encounter (Signed)
Just did a virtual visit with him and his wife.  He also start having sxs last night and she has tested Pos for COVID. She started having sxs 2 days ago. Will send over paxlovid  Meds ordered this encounter  Medications   nirmatrelvir/ritonavir, renal dosing, (PAXLOVID) 10 x 150 MG & 10 x 100MG  TABS    Sig: Take 2 tablets by mouth 2 (two) times daily for 5 days. (Take nirmatrelvir 150 mg one tablet twice daily for 5 days and ritonavir 100 mg one tablet twice daily for 5 days) Patient GFR is 59    Dispense:  20 tablet    Refill:  0

## 2022-04-25 NOTE — Progress Notes (Signed)
Hi Jeremy Woodward, A1c was 5.9 so still in the prediabetes range.  Continue to work on healthy diet and regular exercise.  Kidney function is stable at 1.2.

## 2022-04-30 DIAGNOSIS — R0902 Hypoxemia: Secondary | ICD-10-CM | POA: Diagnosis not present

## 2022-04-30 DIAGNOSIS — G4734 Idiopathic sleep related nonobstructive alveolar hypoventilation: Secondary | ICD-10-CM | POA: Diagnosis not present

## 2022-05-06 ENCOUNTER — Encounter: Payer: Medicare HMO | Admitting: Family Medicine

## 2022-05-13 ENCOUNTER — Ambulatory Visit: Payer: Medicare HMO | Admitting: Family Medicine

## 2022-05-15 ENCOUNTER — Encounter (INDEPENDENT_AMBULATORY_CARE_PROVIDER_SITE_OTHER): Payer: Medicare HMO | Admitting: Ophthalmology

## 2022-05-22 ENCOUNTER — Encounter (INDEPENDENT_AMBULATORY_CARE_PROVIDER_SITE_OTHER): Payer: Medicare HMO | Admitting: Ophthalmology

## 2022-05-24 ENCOUNTER — Encounter (INDEPENDENT_AMBULATORY_CARE_PROVIDER_SITE_OTHER): Payer: Medicare HMO | Admitting: Ophthalmology

## 2022-05-24 DIAGNOSIS — H35371 Puckering of macula, right eye: Secondary | ICD-10-CM | POA: Diagnosis not present

## 2022-05-24 DIAGNOSIS — H43813 Vitreous degeneration, bilateral: Secondary | ICD-10-CM

## 2022-05-24 DIAGNOSIS — H35341 Macular cyst, hole, or pseudohole, right eye: Secondary | ICD-10-CM

## 2022-05-24 DIAGNOSIS — I10 Essential (primary) hypertension: Secondary | ICD-10-CM

## 2022-05-24 DIAGNOSIS — H35033 Hypertensive retinopathy, bilateral: Secondary | ICD-10-CM

## 2022-05-29 ENCOUNTER — Encounter: Payer: Self-pay | Admitting: Family Medicine

## 2022-05-29 DIAGNOSIS — F324 Major depressive disorder, single episode, in partial remission: Secondary | ICD-10-CM

## 2022-05-29 DIAGNOSIS — F4010 Social phobia, unspecified: Secondary | ICD-10-CM

## 2022-05-30 DIAGNOSIS — G4734 Idiopathic sleep related nonobstructive alveolar hypoventilation: Secondary | ICD-10-CM | POA: Diagnosis not present

## 2022-05-30 DIAGNOSIS — R0902 Hypoxemia: Secondary | ICD-10-CM | POA: Diagnosis not present

## 2022-05-30 MED ORDER — SERTRALINE HCL 100 MG PO TABS: 100.0000 mg | ORAL_TABLET | Freq: Every day | ORAL | 0 refills | Status: DC

## 2022-05-30 NOTE — Telephone Encounter (Signed)
Meds ordered this encounter  °Medications  ° sertraline (ZOLOFT) 100 MG tablet  °  Sig: Take 1 tablet (100 mg total) by mouth daily.  °  Dispense:  90 tablet  °  Refill:  0  ° ° °

## 2022-06-14 ENCOUNTER — Other Ambulatory Visit: Payer: Self-pay | Admitting: Family Medicine

## 2022-06-14 DIAGNOSIS — F5101 Primary insomnia: Secondary | ICD-10-CM

## 2022-06-18 ENCOUNTER — Encounter: Payer: Self-pay | Admitting: Family Medicine

## 2022-06-18 ENCOUNTER — Other Ambulatory Visit: Payer: Self-pay | Admitting: Family Medicine

## 2022-06-18 DIAGNOSIS — F5101 Primary insomnia: Secondary | ICD-10-CM

## 2022-06-19 DIAGNOSIS — H18513 Endothelial corneal dystrophy, bilateral: Secondary | ICD-10-CM | POA: Diagnosis not present

## 2022-06-19 DIAGNOSIS — H35371 Puckering of macula, right eye: Secondary | ICD-10-CM | POA: Diagnosis not present

## 2022-06-19 DIAGNOSIS — H2513 Age-related nuclear cataract, bilateral: Secondary | ICD-10-CM | POA: Diagnosis not present

## 2022-06-19 MED ORDER — ZOLPIDEM TARTRATE 10 MG PO TABS: ORAL_TABLET | ORAL | 0 refills | Status: DC

## 2022-06-19 NOTE — Telephone Encounter (Signed)
Meds ordered this encounter  Medications   zolpidem (AMBIEN) 10 MG tablet    Sig: TAKE 1/2 TO 1 TABLET BY MOUTH EVERY NIGHT AT BEDTIME AS NEEDED    Dispense:  90 tablet    Refill:  0   Updated med list.

## 2022-06-30 DIAGNOSIS — G4734 Idiopathic sleep related nonobstructive alveolar hypoventilation: Secondary | ICD-10-CM | POA: Diagnosis not present

## 2022-06-30 DIAGNOSIS — R0902 Hypoxemia: Secondary | ICD-10-CM | POA: Diagnosis not present

## 2022-07-13 DIAGNOSIS — G4733 Obstructive sleep apnea (adult) (pediatric): Secondary | ICD-10-CM | POA: Diagnosis not present

## 2022-07-16 ENCOUNTER — Ambulatory Visit: Payer: Medicare HMO | Admitting: Dermatology

## 2022-07-16 ENCOUNTER — Encounter: Payer: Self-pay | Admitting: Dermatology

## 2022-07-16 VITALS — BP 132/83 | HR 64

## 2022-07-16 DIAGNOSIS — D229 Melanocytic nevi, unspecified: Secondary | ICD-10-CM

## 2022-07-16 DIAGNOSIS — L821 Other seborrheic keratosis: Secondary | ICD-10-CM

## 2022-07-16 DIAGNOSIS — L578 Other skin changes due to chronic exposure to nonionizing radiation: Secondary | ICD-10-CM

## 2022-07-16 DIAGNOSIS — L820 Inflamed seborrheic keratosis: Secondary | ICD-10-CM | POA: Diagnosis not present

## 2022-07-16 DIAGNOSIS — L814 Other melanin hyperpigmentation: Secondary | ICD-10-CM | POA: Diagnosis not present

## 2022-07-16 DIAGNOSIS — W908XXA Exposure to other nonionizing radiation, initial encounter: Secondary | ICD-10-CM | POA: Diagnosis not present

## 2022-07-16 DIAGNOSIS — L570 Actinic keratosis: Secondary | ICD-10-CM

## 2022-07-16 DIAGNOSIS — D485 Neoplasm of uncertain behavior of skin: Secondary | ICD-10-CM

## 2022-07-16 DIAGNOSIS — D1801 Hemangioma of skin and subcutaneous tissue: Secondary | ICD-10-CM

## 2022-07-16 DIAGNOSIS — L603 Nail dystrophy: Secondary | ICD-10-CM

## 2022-07-16 DIAGNOSIS — Z1283 Encounter for screening for malignant neoplasm of skin: Secondary | ICD-10-CM | POA: Diagnosis not present

## 2022-07-16 NOTE — Progress Notes (Signed)
New Patient Visit   Subjective  Jeremy Woodward is a 77 y.o. male who presents for the following: Nail Deformity  Patient states he  has abnormal growth located at the finger nail that he  would like to have examined. Patient reports the areas have been there for  1  year(s). he  reports the areas are bothersome. He states that the areas have not spread. Patient reports has previously been treated for these areas. Patient reports Hx of bx. Patient Denies family history of skin cancer(s).    The following portions of the chart were reviewed this encounter and updated as appropriate: medications, allergies, medical history  Review of Systems:  No other skin or systemic complaints except as noted in HPI or Assessment and Plan.  Objective  Well appearing patient in no apparent distress; mood and affect are within normal limits.  A wait up skin examination was performed of the following areas: scalp, face, neck, chest, back, arms, hands, fingernails   Relevant exam findings are noted in the Assessment and Plan.  Left Abdomen (side) - Upper 8mm Pink brown and black papule    Right Abdomen (side) - Upper 8mm Pink and Brown Papule  Head - Anterior (Face) Erythematous thin papules/macules with gritty scale.   Left Hand Middle Finger     Assessment & Plan   Dystrophic Fingernail (Left hand Middle Finger) Exam: thick dystrophic lateral nail plate.  Treatment Plan: -Reviewed nail pathology report performed by PCP which shows no fungal elements.  PCP surgically removed entire lateral nail plate in hopes of it growing in normal.  Pt states that it did regrow it has never been normal.  He described it as thick and keratotic from the early regrowth stages.  -I explained to pt that there appears to be a problem with the nail bed and not the nail matrix.  However trauma to either area will cause permanent abnormal growth of the nail plate.  - Recommended Kerasol Nail patches daily and file  down and buffer to aid in normal appearing nail growth  Inflamed SEBORRHEIC KERATOSIS vs Dysplastic Nevus - Stuck-on, waxy, tan-brown papules and/or plaques  - Bx Obtained in office today to R/O ISK vs DN - Observe - Call for any changes  Neoplasm of uncertain behavior of skin (2) Left Abdomen (side) - Upper  Skin / nail biopsy Type of biopsy: tangential   Informed consent: discussed and consent obtained   Timeout: patient name, date of birth, surgical site, and procedure verified   Procedure prep:  Patient was prepped and draped in usual sterile fashion Prep type:  Isopropyl alcohol Anesthesia: the lesion was anesthetized in a standard fashion   Anesthetic:  1% lidocaine w/ epinephrine 1-100,000 buffered w/ 8.4% NaHCO3 Instrument used: DermaBlade   Hemostasis achieved with: aluminum chloride   Outcome: patient tolerated procedure well   Post-procedure details: sterile dressing applied and wound care instructions given   Dressing type: petrolatum gauze and bandage   Additional details:  A:  Right Abdomen (side) - Upper  Skin / nail biopsy Type of biopsy: tangential   Informed consent: discussed and consent obtained   Timeout: patient name, date of birth, surgical site, and procedure verified   Procedure prep:  Patient was prepped and draped in usual sterile fashion Prep type:  Isopropyl alcohol Anesthesia: the lesion was anesthetized in a standard fashion   Anesthetic:  1% lidocaine w/ epinephrine 1-100,000 buffered w/ 8.4% NaHCO3 Instrument used: DermaBlade   Hemostasis achieved  with: aluminum chloride   Outcome: patient tolerated procedure well   Post-procedure details: sterile dressing applied and wound care instructions given   Dressing type: petrolatum gauze and bandage   Additional details:  B  Actinic keratosis Head - Anterior (Face)  Symptomatic, irritating, patient would like treated.  Benign-appearing.  Call clinic for new or changing lesions.   Prior to  procedure, discussed risks of blister formation, small wound, skin dyspigmentation, or rare scar following treatment. Recommend Vaseline ointment to treated areas while healing.   Destruction of lesion - Head - Anterior (Face) Complexity: simple   Destruction method: cryotherapy   Informed consent: discussed and consent obtained   Timeout:  patient name, date of birth, surgical site, and procedure verified Lesion destroyed using liquid nitrogen: Yes   Cryotherapy cycles:  1 Outcome: patient tolerated procedure well with no complications   Post-procedure details: wound care instructions given     LENTIGINES, SEBORRHEIC KERATOSES, HEMANGIOMAS - Benign normal skin lesions - Benign-appearing - Call for any changes  MELANOCYTIC NEVI - Tan-brown and/or pink-flesh-colored symmetric macules and papules - Benign appearing on exam today - Observation - Call clinic for new or changing moles - Recommend daily use of broad spectrum spf 30+ sunscreen to sun-exposed areas.   ACTINIC DAMAGE - Chronic condition, secondary to cumulative UV/sun exposure - diffuse scaly erythematous macules with underlying dyspigmentation - Recommend daily broad spectrum sunscreen SPF 30+ to sun-exposed areas, reapply every 2 hours as needed.  - Staying in the shade or wearing long sleeves, sun glasses (UVA+UVB protection) and wide brim hats (4-inch brim around the entire circumference of the hat) are also recommended for sun protection.  - Call for new or changing lesions.  SKIN CANCER SCREENING PERFORMED TODAY   Return in about 4 months (around 11/15/2022).    Documentation: I have reviewed the above documentation for accuracy and completeness, and I agree with the above.  Stasia Cavalier, am acting as scribe for Langston Reusing, DO.  Langston Reusing, DO

## 2022-07-16 NOTE — Patient Instructions (Addendum)
Patient Handout: Wound Care for Skin Biopsy Site  Patient Handout: Wound Care for Skin Biopsy Site  Taking Care of Your Skin Biopsy Site  Proper care of the biopsy site is essential for promoting healing and minimizing scarring. This handout provides instructions on how to care for your biopsy site to ensure optimal recovery.  1. Cleaning the Wound:  Clean the biopsy site daily with gentle soap and water. Gently pat the area dry with a clean, soft towel. Avoid harsh scrubbing or rubbing the area, as this can irritate the skin and delay healing.  2. Applying Aquaphor and Bandage:  After cleaning the wound, apply a thin layer of Aquaphor ointment to the biopsy site. Cover the area with a sterile bandage to protect it from dirt, bacteria, and friction. Change the bandage daily or as needed if it becomes soiled or wet.  3. Continued Care for One Week:  Repeat the cleaning, Aquaphor application, and bandaging process daily for one week following the biopsy procedure. Keeping the wound clean and moist during this initial healing period will help prevent infection and promote optimal healing.  4. Massaging Aquaphor into the Area:  ---After one week, discontinue the use of bandages but continue to apply Aquaphor to the biopsy site. ----Gently massage the Aquaphor into the area using circular motions. ---Massaging the skin helps to promote circulation and prevent the formation of scar tissue.   Additional Tips:  Avoid exposing the biopsy site to direct sunlight during the healing process, as this can cause hyperpigmentation or worsen scarring. If you experience any signs of infection, such as increased redness, swelling, warmth, or drainage from the wound, contact your healthcare provider immediately. Follow any additional instructions provided by your healthcare provider for caring for the biopsy site and managing any discomfort. Conclusion:  Taking proper care of your skin biopsy site  is crucial for ensuring optimal healing and minimizing scarring. By following these instructions for cleaning, applying Aquaphor, and massaging the area, you can promote a smooth and successful recovery. If you have any questions or concerns about caring for your biopsy site, don't hesitate to contact your healthcare provider for guidance.     Cryotherapy Aftercare  Wash gently with soap and water everyday.   Apply Vaseline and Band-Aid daily until healed.   Due to recent changes in healthcare laws, you may see results of your pathology and/or laboratory studies on MyChart before the doctors have had a chance to review them. We understand that in some cases there may be results that are confusing or concerning to you. Please understand that not all results are received at the same time and often the doctors may need to interpret multiple results in order to provide you with the best plan of care or course of treatment. Therefore, we ask that you please give us 2 business days to thoroughly review all your results before contacting the office for clarification. Should we see a critical lab result, you will be contacted sooner.   If You Need Anything After Your Visit  If you have any questions or concerns for your doctor, please call our main line at [phone] If no one answers, please leave a voicemail as directed and we will return your call as soon as possible. Messages left after 4 pm will be answered the following business day.   You may also send us a message via MyChart. We typically respond to MyChart messages within 1-2 business days.  For prescription refills, please ask your pharmacy   to contact our office. Our fax number is [phone].  If you have an urgent issue when the clinic is closed that cannot wait until the next business day, you can page your doctor at the number below.    Please note that while we do our best to be available for urgent issues outside of office hours, we  are not available 24/7.   If you have an urgent issue and are unable to reach us, you may choose to seek medical care at your doctor's office, retail clinic, urgent care center, or emergency room.  If you have a medical emergency, please immediately call 911 or go to the emergency department. In the event of inclement weather, please call our main line at [phone] for an update on the status of any delays or closures.  Dermatology Medication Tips: Please keep the boxes that topical medications come in in order to help keep track of the instructions about where and how to use these. Pharmacies typically print the medication instructions only on the boxes and not directly on the medication tubes.   If your medication is too expensive, please contact our office at [phone] or send us a message through MyChart.   We are unable to tell what your co-pay for medications will be in advance as this is different depending on your insurance coverage. However, we may be able to find a substitute medication at lower cost or fill out paperwork to get insurance to cover a needed medication.   If a prior authorization is required to get your medication covered by your insurance company, please allow us 1-2 business days to complete this process.  Drug prices often vary depending on where the prescription is filled and some pharmacies may offer cheaper prices.  The website www.goodrx.com contains coupons for medications through different pharmacies. The prices here do not account for what the cost may be with help from insurance (it may be cheaper with your insurance), but the website can give you the price if you did not use any insurance.  - You can print the associated coupon and take it with your prescription to the pharmacy.  - You may also stop by our office during regular business hours and pick up a GoodRx coupon card.  - If you need your prescription sent electronically to a different pharmacy,  notify our office through [place] MyChart or by phone at [phone]     

## 2022-07-23 DIAGNOSIS — H25811 Combined forms of age-related cataract, right eye: Secondary | ICD-10-CM | POA: Diagnosis not present

## 2022-07-23 DIAGNOSIS — H2511 Age-related nuclear cataract, right eye: Secondary | ICD-10-CM | POA: Diagnosis not present

## 2022-07-23 DIAGNOSIS — Z961 Presence of intraocular lens: Secondary | ICD-10-CM | POA: Diagnosis not present

## 2022-07-30 DIAGNOSIS — R0902 Hypoxemia: Secondary | ICD-10-CM | POA: Diagnosis not present

## 2022-07-30 DIAGNOSIS — G4734 Idiopathic sleep related nonobstructive alveolar hypoventilation: Secondary | ICD-10-CM | POA: Diagnosis not present

## 2022-08-27 DIAGNOSIS — Z961 Presence of intraocular lens: Secondary | ICD-10-CM | POA: Diagnosis not present

## 2022-08-27 DIAGNOSIS — H2512 Age-related nuclear cataract, left eye: Secondary | ICD-10-CM | POA: Diagnosis not present

## 2022-08-27 DIAGNOSIS — H25812 Combined forms of age-related cataract, left eye: Secondary | ICD-10-CM | POA: Diagnosis not present

## 2022-08-29 ENCOUNTER — Other Ambulatory Visit: Payer: Self-pay | Admitting: Family Medicine

## 2022-08-29 DIAGNOSIS — F324 Major depressive disorder, single episode, in partial remission: Secondary | ICD-10-CM

## 2022-08-29 DIAGNOSIS — F4010 Social phobia, unspecified: Secondary | ICD-10-CM

## 2022-08-30 DIAGNOSIS — R0902 Hypoxemia: Secondary | ICD-10-CM | POA: Diagnosis not present

## 2022-08-30 DIAGNOSIS — G4734 Idiopathic sleep related nonobstructive alveolar hypoventilation: Secondary | ICD-10-CM | POA: Diagnosis not present

## 2022-09-18 ENCOUNTER — Other Ambulatory Visit: Payer: Self-pay | Admitting: Family Medicine

## 2022-09-30 DIAGNOSIS — G4734 Idiopathic sleep related nonobstructive alveolar hypoventilation: Secondary | ICD-10-CM | POA: Diagnosis not present

## 2022-09-30 DIAGNOSIS — R0902 Hypoxemia: Secondary | ICD-10-CM | POA: Diagnosis not present

## 2022-10-04 ENCOUNTER — Other Ambulatory Visit: Payer: Self-pay | Admitting: Family Medicine

## 2022-10-04 DIAGNOSIS — F5101 Primary insomnia: Secondary | ICD-10-CM

## 2022-10-28 ENCOUNTER — Encounter: Payer: Self-pay | Admitting: Family Medicine

## 2022-10-28 ENCOUNTER — Ambulatory Visit (INDEPENDENT_AMBULATORY_CARE_PROVIDER_SITE_OTHER): Payer: Medicare HMO | Admitting: Family Medicine

## 2022-10-28 VITALS — BP 126/51 | HR 74 | Ht 75.0 in | Wt 263.0 lb

## 2022-10-28 DIAGNOSIS — N1831 Chronic kidney disease, stage 3a: Secondary | ICD-10-CM

## 2022-10-28 DIAGNOSIS — R7301 Impaired fasting glucose: Secondary | ICD-10-CM

## 2022-10-28 DIAGNOSIS — R5383 Other fatigue: Secondary | ICD-10-CM

## 2022-10-28 DIAGNOSIS — F324 Major depressive disorder, single episode, in partial remission: Secondary | ICD-10-CM

## 2022-10-28 DIAGNOSIS — F4010 Social phobia, unspecified: Secondary | ICD-10-CM

## 2022-10-28 DIAGNOSIS — I10 Essential (primary) hypertension: Secondary | ICD-10-CM

## 2022-10-28 DIAGNOSIS — K529 Noninfective gastroenteritis and colitis, unspecified: Secondary | ICD-10-CM

## 2022-10-28 DIAGNOSIS — E538 Deficiency of other specified B group vitamins: Secondary | ICD-10-CM

## 2022-10-28 DIAGNOSIS — Z23 Encounter for immunization: Secondary | ICD-10-CM

## 2022-10-28 DIAGNOSIS — G4700 Insomnia, unspecified: Secondary | ICD-10-CM | POA: Diagnosis not present

## 2022-10-28 MED ORDER — METFORMIN HCL ER 500 MG PO TB24
500.0000 mg | ORAL_TABLET | Freq: Every day | ORAL | 1 refills | Status: DC
Start: 2022-10-28 — End: 2023-04-24

## 2022-10-28 MED ORDER — ESZOPICLONE 3 MG PO TABS
3.0000 mg | ORAL_TABLET | Freq: Every day | ORAL | 1 refills | Status: DC
Start: 2022-10-28 — End: 2023-02-27

## 2022-10-28 MED ORDER — DIPHENOXYLATE-ATROPINE 2.5-0.025 MG PO TABS: 1.0000 | ORAL_TABLET | Freq: Three times a day (TID) | ORAL | 0 refills | Status: DC | PRN

## 2022-10-28 MED ORDER — SERTRALINE HCL 50 MG PO TABS
50.0000 mg | ORAL_TABLET | Freq: Every day | ORAL | 3 refills | Status: DC
Start: 2022-10-28 — End: 2023-11-05

## 2022-10-28 NOTE — Assessment & Plan Note (Signed)
Well controlled. Continue current regimen. Follow up in  6 mo  

## 2022-10-28 NOTE — Assessment & Plan Note (Signed)
A1C is stable. We did discussed metformin

## 2022-10-28 NOTE — Assessment & Plan Note (Signed)
Discussed options.  I think he is really build up a tolerance to Ambien so we will try switching to [facility] for a while and see if this works a little better.

## 2022-10-28 NOTE — Assessment & Plan Note (Signed)
He says probiotics have been helpful in the past and wanted to know if we had any suggestions for specific probiotics.  See AVS.  Also refilled Lomotil for as needed use.

## 2022-10-28 NOTE — Assessment & Plan Note (Signed)
He is still taking a B12 supplement.  Plan to recheck levels to see if they are adequate.

## 2022-10-28 NOTE — Progress Notes (Signed)
Established Patient Office Visit  Subjective   Patient ID: Jeremy Woodward, male    DOB: 03-17-1945  Age: 77 y.o. MRN: [id number]  Chief Complaint  Patient presents with   Insomnia    HPI  Hypertension- Pt denies chest pain, SOB, dizziness, or heart palpitations.  Taking meds as directed w/o problems.  Denies medication side effects.    Impaired fasting glucose-no increased thirst or urination. No symptoms consistent with hypoglycemia.  F/u depression - on 25mg  of sertraline.  The 100mg  was too sedating so went back to the 50mg  and has been splitting those in half.   Sill feels really fatigue. No energy.  Gets easily fatigue with activity. Not very active.   Has an old prescription for Lomotil and would like to have it refilled he uses it sparingly he has frequent diarrhea . His vaccines are up-to-date but he did want to know if he has had the Prevnar 20.   Sleep quality has been really poor.  He had tried splitting the Ambien to 5 mg but just could not fall asleep at all so he went back up to 10 mg and says it would still take over an hour for him to fall asleep.  It just does not seem to be working as well as it used to he tries to lay down in bed rest read a book and just cannot seem to fall asleep.  Feels like he is aged more in the last 8 months than he has in a long time.     ROS    Objective:     BP (!) 126/51   Pulse 74   Ht 6\' 3"  (1.905 m)   Wt 263 lb (119.3 kg)   SpO2 98%   BMI 32.87 kg/m    Physical Exam Vitals and nursing note reviewed.  Constitutional:      Appearance: Normal appearance.  HENT:     Head: Normocephalic and atraumatic.  Eyes:     Conjunctiva/sclera: Conjunctivae normal.  Cardiovascular:     Rate and Rhythm: Normal rate and regular rhythm.  Pulmonary:     Effort: Pulmonary effort is normal.     Breath sounds: Normal breath sounds.  Skin:    General: Skin is warm and dry.  Neurological:     Mental Status: He is alert.   Psychiatric:        Mood and Affect: Mood normal.      No results found for any visits on 10/28/22.    The ASCVD Risk score (Arnett DK, et al., 2019) failed to calculate for the following reasons:   The valid total cholesterol range is 130 to 320 mg/dL    Assessment & Plan:   Problem List Items Addressed This Visit       Cardiovascular and Mediastinum   Benign essential hypertension    Well controlled. Continue current regimen. Follow up in  88mo       Relevant Orders   TSH   CMP14+EGFR   Hemoglobin A1c   B12     Digestive   Chronic diarrhea    He says probiotics have been helpful in the past and wanted to know if we had any suggestions for specific probiotics.  See AVS.  Also refilled Lomotil for as needed use.      Relevant Medications   diphenoxylate-atropine (LOMOTIL) 2.5-0.025 MG tablet     Endocrine   IFG (impaired fasting glucose)    A1C is stable. We did discussed  metformin      Relevant Medications   metFORMIN (GLUCOPHAGE-XR) 500 MG 24 hr tablet   Other Relevant Orders   TSH   CMP14+EGFR   Hemoglobin A1c   B12     Genitourinary   CKD (chronic kidney disease) stage 3, GFR 30-59 ml/min (HCC)    To monitor renal function every 6 months.        Other   Insomnia (Chronic)    Discussed options.  I think he is really build up a tolerance to Ambien so we will try switching to [facility] for a while and see if this works a little better.      Relevant Medications   eszopiclone 3 MG TABS   Social anxiety disorder    Well on current dose of sertraline he is actually been splitting the 50s.  He would like to stay at the 50 mg just in case he decides he wants to go back up.  So we will keep the prescription the same and go ahead and remove the 100 mg tab from the med list.      Relevant Medications   sertraline (ZOLOFT) 50 MG tablet   Depression, major, single episode, in partial remission (HCC)   Relevant Medications   sertraline (ZOLOFT) 50 MG  tablet   B12 deficiency    He is still taking a B12 supplement.  Plan to recheck levels to see if they are adequate.      Relevant Orders   B12   Other Visit Diagnoses     Encounter for immunization    -  Primary   Relevant Orders   Flu Vaccine Trivalent High Dose (Fluad) (Completed)   Pneumococcal conjugate vaccine 20-valent (Completed)   Other fatigue       Relevant Orders   TSH   CMP14+EGFR   Hemoglobin A1c   B12       Return in about 4 months (around 02/27/2023) for Pre-diabetes, Hypertension.    Nani Gasser, MD

## 2022-10-28 NOTE — Patient Instructions (Signed)
Align and culturelle are 2 good probiotics.

## 2022-10-28 NOTE — Assessment & Plan Note (Signed)
Well on current dose of sertraline he is actually been splitting the 50s.  He would like to stay at the 50 mg just in case he decides he wants to go back up.  So we will keep the prescription the same and go ahead and remove the 100 mg tab from the med list.

## 2022-10-28 NOTE — Assessment & Plan Note (Signed)
To monitor renal function every 6 months.

## 2022-10-29 LAB — CMP14+EGFR
ALT: 27 [IU]/L (ref 0–44)
AST: 29 [IU]/L (ref 0–40)
Albumin: 4.3 g/dL (ref 3.8–4.8)
Alkaline Phosphatase: 53 [IU]/L (ref 44–121)
BUN/Creatinine Ratio: 12 (ref 10–24)
BUN: 14 mg/dL (ref 8–27)
Bilirubin Total: 0.5 mg/dL (ref 0.0–1.2)
CO2: 20 mmol/L (ref 20–29)
Calcium: 9.9 mg/dL (ref 8.6–10.2)
Chloride: 102 mmol/L (ref 96–106)
Creatinine, Ser: 1.18 mg/dL (ref 0.76–1.27)
Globulin, Total: 2 g/dL (ref 1.5–4.5)
Glucose: 97 mg/dL (ref 70–99)
Potassium: 4 mmol/L (ref 3.5–5.2)
Sodium: 139 mmol/L (ref 134–144)
Total Protein: 6.3 g/dL (ref 6.0–8.5)
eGFR: 63 mL/min/{1.73_m2} (ref >59)

## 2022-10-29 LAB — TSH: TSH: 3.36 u[IU]/mL (ref 0.450–4.500)

## 2022-10-29 LAB — HEMOGLOBIN A1C
Est. average glucose Bld gHb Est-mCnc: 120 mg/dL
Hgb A1c MFr Bld: 5.8 % — ABNORMAL HIGH (ref 4.8–5.6)

## 2022-10-29 LAB — VITAMIN B12: Vitamin B-12: 756 pg/mL (ref 232–1245)

## 2022-10-29 NOTE — Progress Notes (Signed)
Hi Lam, A1c is stable at 5.8 still in the prediabetes range but stable.  Thyroid looks good at 3.3.  Metabolic panel looks great.  B12 looks great as well.

## 2022-10-30 DIAGNOSIS — R0902 Hypoxemia: Secondary | ICD-10-CM | POA: Diagnosis not present

## 2022-10-30 DIAGNOSIS — G4734 Idiopathic sleep related nonobstructive alveolar hypoventilation: Secondary | ICD-10-CM | POA: Diagnosis not present

## 2022-11-14 ENCOUNTER — Ambulatory Visit: Payer: Medicare HMO | Admitting: Dermatology

## 2022-11-14 ENCOUNTER — Encounter: Payer: Self-pay | Admitting: Dermatology

## 2022-11-14 VITALS — BP 105/66 | HR 63

## 2022-11-14 DIAGNOSIS — W908XXA Exposure to other nonionizing radiation, initial encounter: Secondary | ICD-10-CM

## 2022-11-14 DIAGNOSIS — K222 Esophageal obstruction: Secondary | ICD-10-CM | POA: Insufficient documentation

## 2022-11-14 DIAGNOSIS — L570 Actinic keratosis: Secondary | ICD-10-CM | POA: Diagnosis not present

## 2022-11-14 NOTE — Patient Instructions (Addendum)
Cryotherapy Aftercare  Wash gently with soap and water everyday.   Apply Vaseline and Band-Aid daily until healed.   Important Information   Due to recent changes in healthcare laws, you may see results of your pathology and/or laboratory studies on MyChart before the doctors have had a chance to review them. We understand that in some cases there may be results that are confusing or concerning to you. Please understand that not all results are received at the same time and often the doctors may need to interpret multiple results in order to provide you with the best plan of care or course of treatment. Therefore, we ask that you please give [place] 2 business days to thoroughly review all your results before contacting the office for clarification. Should we see a critical lab result, you will be contacted sooner.     If You Need Anything After Your Visit   If you have any questions or concerns for your doctor, please call our main line at [phone]. If no one answers, please leave a voicemail as directed and we will return your call as soon as possible. Messages left after 4 pm will be answered the following business day.    You may also send [place] a message via MyChart. We typically respond to MyChart messages within 1-2 business days.  For prescription refills, please ask your pharmacy to contact our office. Our fax number is [phone].  If you have an urgent issue when the clinic is closed that cannot wait until the next business day, you can page your doctor at the number below.     Please note that while we do our best to be available for urgent issues outside of office hours, we are not available 24/7.    If you have an urgent issue and are unable to reach [place], you may choose to seek medical care at your doctor's office, retail clinic, urgent care center, or emergency room.   If you have a medical emergency, please immediately call 911 or go to the emergency department. In the event  of inclement weather, please call our main line at [phone] for an update on the status of any delays or closures.  Dermatology Medication Tips: Please keep the boxes that topical medications come in in order to help keep track of the instructions about where and how to use these. Pharmacies typically print the medication instructions only on the boxes and not directly on the medication tubes.   If your medication is too expensive, please contact our office at [phone] or send [place] a message through MyChart.    We are unable to tell what your co-pay for medications will be in advance as this is different depending on your insurance coverage. However, we may be able to find a substitute medication at lower cost or fill out paperwork to get insurance to cover a needed medication.    If a prior authorization is required to get your medication covered by your insurance company, please allow [place] 1-2 business days to complete this process.   Drug prices often vary depending on where the prescription is filled and some pharmacies may offer cheaper prices.   The website www.goodrx.com contains coupons for medications through different pharmacies. The prices here do not account for what the cost may be with help from insurance (it may be cheaper with your insurance), but the website can give you the price if you did not use any insurance.  - You can print the  associated coupon and take it with your prescription to the pharmacy.  - You may also stop by our office during regular business hours and pick up a GoodRx coupon card.  - If you need your prescription sent electronically to a different pharmacy, notify our office through [facility] or by phone at [phone]

## 2022-11-14 NOTE — Progress Notes (Signed)
   Follow-Up Visit   Subjective  Jeremy Woodward is a 77 y.o. male who presents for the following: AK  Patient present today for follow up visit for AK. Patient was last evaluated on 07/16/22. Patient reports sxs are better. Patient denies medication changes. Patient reports the areas fell better and he doesn't notice  The following portions of the chart were reviewed this encounter and updated as appropriate: medications, allergies, medical history  Review of Systems:  No other skin or systemic complaints except as noted in HPI or Assessment and Plan.  Objective  Well appearing patient in no apparent distress; mood and affect are within normal limits.  A focused examination was performed of the following areas: Face  Relevant exam findings are noted in the Assessment and Plan.  Left Malar Cheek, Left Temple, Left Zygomatic Area, Mid Forehead, Right Temple, Right Zygomatic Area (2) Erythematous thin papules/macules with gritty scale.    Assessment & Plan   ACTINIC KERATOSIS Exam: Erythematous thin papules/macules with gritty scale at the Face  Actinic keratoses are precancerous spots that appear secondary to cumulative UV radiation exposure/sun exposure over time. They are chronic with expected duration over 1 year. A portion of actinic keratoses will progress to squamous cell carcinoma of the skin. It is not possible to reliably predict which spots will progress to skin cancer and so treatment is recommended to prevent development of skin cancer.  Recommend daily broad spectrum sunscreen SPF 30+ to sun-exposed areas, reapply every 2 hours as needed.  Recommend staying in the shade or wearing long sleeves, sun glasses (UVA+UVB protection) and wide brim hats (4-inch brim around the entire circumference of the hat). Call for new or changing lesions.  Treatment Plan: - AK (actinic keratosis) (7) Mid Forehead; Left Temple; Right Temple; Left Zygomatic Area; Right Zygomatic Area (2);  Left Malar Cheek  Related Procedures Destruction of lesion Complexity: simple   Destruction method: cryotherapy   Informed consent: discussed and consent obtained   Timeout:  patient name, date of birth, surgical site, and procedure verified Lesion destroyed using liquid nitrogen: Yes   Post-procedure details: wound care instructions given    No follow-ups on file.   Documentation: I have reviewed the above documentation for accuracy and completeness, and I agree with the above.  Stasia Cavalier, am acting as scribe for Langston Reusing, DO.   Langston Reusing, DO

## 2022-11-19 DIAGNOSIS — G4733 Obstructive sleep apnea (adult) (pediatric): Secondary | ICD-10-CM | POA: Diagnosis not present

## 2022-11-30 DIAGNOSIS — G4734 Idiopathic sleep related nonobstructive alveolar hypoventilation: Secondary | ICD-10-CM | POA: Diagnosis not present

## 2022-11-30 DIAGNOSIS — R0902 Hypoxemia: Secondary | ICD-10-CM | POA: Diagnosis not present

## 2022-12-10 DIAGNOSIS — H43393 Other vitreous opacities, bilateral: Secondary | ICD-10-CM | POA: Diagnosis not present

## 2022-12-10 DIAGNOSIS — H35371 Puckering of macula, right eye: Secondary | ICD-10-CM | POA: Diagnosis not present

## 2022-12-10 DIAGNOSIS — H18513 Endothelial corneal dystrophy, bilateral: Secondary | ICD-10-CM | POA: Diagnosis not present

## 2022-12-10 DIAGNOSIS — H43813 Vitreous degeneration, bilateral: Secondary | ICD-10-CM | POA: Diagnosis not present

## 2022-12-10 DIAGNOSIS — H35341 Macular cyst, hole, or pseudohole, right eye: Secondary | ICD-10-CM | POA: Diagnosis not present

## 2022-12-17 ENCOUNTER — Other Ambulatory Visit: Payer: Self-pay | Admitting: Cardiology

## 2022-12-30 DIAGNOSIS — G4734 Idiopathic sleep related nonobstructive alveolar hypoventilation: Secondary | ICD-10-CM | POA: Diagnosis not present

## 2022-12-30 DIAGNOSIS — R0902 Hypoxemia: Secondary | ICD-10-CM | POA: Diagnosis not present

## 2023-01-17 ENCOUNTER — Telehealth: Payer: Self-pay

## 2023-01-17 NOTE — Telephone Encounter (Signed)
OK to order 

## 2023-01-17 NOTE — Telephone Encounter (Signed)
Copied from CRM [id number]. Topic: Clinical - Request for Lab/Test Order >> Jan 17, 2023 10:22 AM Clayton Bibles wrote: Reason for CRM: He would like to have his PSA checked before the end of the year. Please call spouse to schedule.

## 2023-01-20 ENCOUNTER — Other Ambulatory Visit: Payer: Self-pay

## 2023-01-20 DIAGNOSIS — Z125 Encounter for screening for malignant neoplasm of prostate: Secondary | ICD-10-CM

## 2023-01-20 NOTE — Telephone Encounter (Signed)
Order placed in chart for lab work  Patient informed and will stop by office for lab work to be drawn before the first of the year.

## 2023-01-23 DIAGNOSIS — Z125 Encounter for screening for malignant neoplasm of prostate: Secondary | ICD-10-CM | POA: Diagnosis not present

## 2023-01-24 LAB — PSA: Prostate Specific Ag, Serum: 3.8 ng/mL (ref 0.0–4.0)

## 2023-01-27 NOTE — Progress Notes (Signed)
Psa normal but higher than last time. Repeat in 6 months.

## 2023-01-30 DIAGNOSIS — G4734 Idiopathic sleep related nonobstructive alveolar hypoventilation: Secondary | ICD-10-CM | POA: Diagnosis not present

## 2023-01-30 DIAGNOSIS — R0902 Hypoxemia: Secondary | ICD-10-CM | POA: Diagnosis not present

## 2023-02-27 ENCOUNTER — Ambulatory Visit (INDEPENDENT_AMBULATORY_CARE_PROVIDER_SITE_OTHER): Payer: Medicare HMO | Admitting: Family Medicine

## 2023-02-27 VITALS — BP 111/50 | HR 71 | Ht 75.0 in | Wt 252.0 lb

## 2023-02-27 DIAGNOSIS — R7301 Impaired fasting glucose: Secondary | ICD-10-CM

## 2023-02-27 DIAGNOSIS — G4700 Insomnia, unspecified: Secondary | ICD-10-CM

## 2023-02-27 DIAGNOSIS — I10 Essential (primary) hypertension: Secondary | ICD-10-CM

## 2023-02-27 DIAGNOSIS — N1831 Chronic kidney disease, stage 3a: Secondary | ICD-10-CM | POA: Diagnosis not present

## 2023-02-27 DIAGNOSIS — Z6831 Body mass index (BMI) 31.0-31.9, adult: Secondary | ICD-10-CM

## 2023-02-27 DIAGNOSIS — R972 Elevated prostate specific antigen [PSA]: Secondary | ICD-10-CM

## 2023-02-27 LAB — POCT GLYCOSYLATED HEMOGLOBIN (HGB A1C): Hemoglobin A1C: 5.3 % (ref 4.0–5.6)

## 2023-02-27 MED ORDER — ZALEPLON 10 MG PO CAPS: 10.0000 mg | ORAL_CAPSULE | Freq: Every evening | ORAL | 1 refills | Status: DC | PRN

## 2023-02-27 NOTE — Progress Notes (Signed)
Established Patient Office Visit  Subjective  Patient ID: Jeremy Woodward, male    DOB: 1946-01-06  Age: 78 y.o. MRN: [id number]  Chief Complaint  Patient presents with   Hypertension   ifg    HPI  Tolerating metformin well. 4 mo f/u.     ROS    Objective:     BP (!) 111/50   Pulse 71   Ht 6\' 3"  (1.905 m)   Wt 252 lb (114.3 kg)   SpO2 99%   BMI 31.50 kg/m    Physical Exam Vitals and nursing note reviewed.  Constitutional:      Appearance: Normal appearance.  HENT:     Head: Normocephalic and atraumatic.  Eyes:     Conjunctiva/sclera: Conjunctivae normal.  Cardiovascular:     Rate and Rhythm: Normal rate and regular rhythm.  Pulmonary:     Effort: Pulmonary effort is normal.     Breath sounds: Normal breath sounds.  Skin:    General: Skin is warm and dry.  Neurological:     Mental Status: He is alert.  Psychiatric:        Mood and Affect: Mood normal.      Results for orders placed or performed in visit on 02/27/23  POCT HgB A1C  Result Value Ref Range   Hemoglobin A1C 5.3 4.0 - 5.6 %   HbA1c POC (<> result, manual entry)     HbA1c, POC (prediabetic range)     HbA1c, POC (controlled diabetic range)        The ASCVD Risk score (Arnett DK, et al., 2019) failed to calculate for the following reasons:   The valid total cholesterol range is 130 to 320 mg/dL    Assessment & Plan:   Problem List Items Addressed This Visit       Cardiovascular and Mediastinum   Benign essential hypertension   Well controlled. Continue current regimen. Follow up in  73mo         Endocrine   IFG (impaired fasting glucose) - Primary   Down 9 lbs and doing really well on meformin.A1C is down to 5. 3 from 5.8. F/U in June.       Relevant Orders   POCT HgB A1C (Completed)     Genitourinary   CKD (chronic kidney disease) stage 3, GFR 30-59 ml/min (HCC)     Other   Insomnia (Chronic)   We had tried to switch to Lunesta back in the fall because the Ambien  did not seem to be working very well he then went up to a whole tab since I last saw him and it still is not helping him fall asleep at night.  He is also been really stressed with some things going on at his church right now as well and so that is been playing a role.  It looks like [name] is covered under his insurance so we will try sending that in for 30-day trial.  We discussed that sometimes just changing the medication for a little while can be helpful.  Continue to work on stress reduction.  He does a good job with sleep hygiene already.      BMI 31.0-31.9,adult   Doing great with weight loss.       Other Visit Diagnoses       Elevated PSA           Elevated PSA - plan to recheck PSA in June.    Return in about 5 months (  around 07/28/2023) for Hypertension, Pre-diabetes, and check prostate level .    Nani Gasser, MD

## 2023-02-27 NOTE — Assessment & Plan Note (Signed)
We had tried to switch to Lunesta back in the fall because the Ambien did not seem to be working very well he then went up to a whole tab since I last saw him and it still is not helping him fall asleep at night.  He is also been really stressed with some things going on at his church right now as well and so that is been playing a role.  It looks like [name] is covered under his insurance so we will try sending that in for 30-day trial.  We discussed that sometimes just changing the medication for a little while can be helpful.  Continue to work on stress reduction.  He does a good job with sleep hygiene already.

## 2023-02-27 NOTE — Assessment & Plan Note (Signed)
Doing great with weight loss.

## 2023-02-27 NOTE — Assessment & Plan Note (Signed)
Down 9 lbs and doing really well on meformin.A1C is down to 5. 3 from 5.8. F/U in June.

## 2023-02-27 NOTE — Assessment & Plan Note (Signed)
Well controlled. Continue current regimen. Follow up in  6 mo

## 2023-03-02 DIAGNOSIS — G4734 Idiopathic sleep related nonobstructive alveolar hypoventilation: Secondary | ICD-10-CM | POA: Diagnosis not present

## 2023-03-02 DIAGNOSIS — R0902 Hypoxemia: Secondary | ICD-10-CM | POA: Diagnosis not present

## 2023-03-12 DIAGNOSIS — H18513 Endothelial corneal dystrophy, bilateral: Secondary | ICD-10-CM | POA: Diagnosis not present

## 2023-03-12 DIAGNOSIS — H35341 Macular cyst, hole, or pseudohole, right eye: Secondary | ICD-10-CM | POA: Diagnosis not present

## 2023-03-12 DIAGNOSIS — H43813 Vitreous degeneration, bilateral: Secondary | ICD-10-CM | POA: Diagnosis not present

## 2023-03-12 DIAGNOSIS — H43393 Other vitreous opacities, bilateral: Secondary | ICD-10-CM | POA: Diagnosis not present

## 2023-03-12 DIAGNOSIS — H35371 Puckering of macula, right eye: Secondary | ICD-10-CM | POA: Diagnosis not present

## 2023-03-17 ENCOUNTER — Other Ambulatory Visit: Payer: Self-pay | Admitting: Cardiology

## 2023-03-26 ENCOUNTER — Other Ambulatory Visit: Payer: Self-pay | Admitting: Family Medicine

## 2023-03-30 DIAGNOSIS — G4734 Idiopathic sleep related nonobstructive alveolar hypoventilation: Secondary | ICD-10-CM | POA: Diagnosis not present

## 2023-03-30 DIAGNOSIS — R0902 Hypoxemia: Secondary | ICD-10-CM | POA: Diagnosis not present

## 2023-04-08 DIAGNOSIS — G4733 Obstructive sleep apnea (adult) (pediatric): Secondary | ICD-10-CM | POA: Diagnosis not present

## 2023-04-09 ENCOUNTER — Encounter: Payer: Self-pay | Admitting: Family Medicine

## 2023-04-10 ENCOUNTER — Other Ambulatory Visit: Payer: Self-pay | Admitting: *Deleted

## 2023-04-10 MED ORDER — LOSARTAN POTASSIUM 50 MG PO TABS: 50.0000 mg | ORAL_TABLET | Freq: Every day | ORAL | 3 refills | Status: DC

## 2023-04-23 DIAGNOSIS — H35341 Macular cyst, hole, or pseudohole, right eye: Secondary | ICD-10-CM | POA: Diagnosis not present

## 2023-04-23 DIAGNOSIS — H35371 Puckering of macula, right eye: Secondary | ICD-10-CM | POA: Diagnosis not present

## 2023-04-23 DIAGNOSIS — H43393 Other vitreous opacities, bilateral: Secondary | ICD-10-CM | POA: Diagnosis not present

## 2023-04-23 DIAGNOSIS — H43813 Vitreous degeneration, bilateral: Secondary | ICD-10-CM | POA: Diagnosis not present

## 2023-04-23 DIAGNOSIS — H18513 Endothelial corneal dystrophy, bilateral: Secondary | ICD-10-CM | POA: Diagnosis not present

## 2023-04-24 ENCOUNTER — Other Ambulatory Visit: Payer: Self-pay | Admitting: Family Medicine

## 2023-04-24 DIAGNOSIS — R7301 Impaired fasting glucose: Secondary | ICD-10-CM

## 2023-04-30 DIAGNOSIS — G4734 Idiopathic sleep related nonobstructive alveolar hypoventilation: Secondary | ICD-10-CM | POA: Diagnosis not present

## 2023-04-30 DIAGNOSIS — R0902 Hypoxemia: Secondary | ICD-10-CM | POA: Diagnosis not present

## 2023-05-15 ENCOUNTER — Ambulatory Visit: Payer: Medicare HMO | Admitting: Dermatology

## 2023-05-15 ENCOUNTER — Encounter: Payer: Self-pay | Admitting: Dermatology

## 2023-05-15 VITALS — BP 107/60

## 2023-05-15 DIAGNOSIS — Z808 Family history of malignant neoplasm of other organs or systems: Secondary | ICD-10-CM

## 2023-05-15 DIAGNOSIS — Z872 Personal history of diseases of the skin and subcutaneous tissue: Secondary | ICD-10-CM

## 2023-05-15 DIAGNOSIS — L570 Actinic keratosis: Secondary | ICD-10-CM

## 2023-05-15 DIAGNOSIS — L821 Other seborrheic keratosis: Secondary | ICD-10-CM

## 2023-05-15 DIAGNOSIS — Z1283 Encounter for screening for malignant neoplasm of skin: Secondary | ICD-10-CM | POA: Diagnosis not present

## 2023-05-15 DIAGNOSIS — L578 Other skin changes due to chronic exposure to nonionizing radiation: Secondary | ICD-10-CM | POA: Diagnosis not present

## 2023-05-15 DIAGNOSIS — L814 Other melanin hyperpigmentation: Secondary | ICD-10-CM

## 2023-05-15 DIAGNOSIS — L820 Inflamed seborrheic keratosis: Secondary | ICD-10-CM

## 2023-05-15 DIAGNOSIS — D1801 Hemangioma of skin and subcutaneous tissue: Secondary | ICD-10-CM

## 2023-05-15 DIAGNOSIS — D229 Melanocytic nevi, unspecified: Secondary | ICD-10-CM

## 2023-05-15 DIAGNOSIS — W908XXA Exposure to other nonionizing radiation, initial encounter: Secondary | ICD-10-CM

## 2023-05-15 NOTE — Patient Instructions (Addendum)
 Hello Jeremy Woodward ,  Thank you for visiting today. Here is a summary of the key instructions:  - Skin Care:   - Use moisturizers with salicylic acid, lactic acid, or urea daily   - Apply Amlactin and CeraVe with salicylic acid as everyday moisturizers   - Use hydrocortisone cream daily for seborrheic dermatitis of the inner ear until clear, but not for more than 2 weeks   - Apply Aquaphor to frozen keratosis spots   - Consider using Aquaphor spray for easier application  - Sun Protection:   - Use moisturizer with sunscreen on face, neck, chest, and arms daily  - Home Care:   - You may gently scrape off crusty spots if it doesn't hurt   - Keep Aquaphor on frozen spots  - Follow-up:   - Return in 6 months for pre-cancer spot check  We look forward to seeing the positive changes in your next visit. If you have any questions or concerns before then, please do not hesitate to contact our office.  Warm regards,  Dr. [facility]    Cryotherapy Aftercare  Wash gently with soap and water everyday.   Apply Vaseline and Band-Aid daily until healed.    Important Information  Due to recent changes in healthcare laws, you may see results of your pathology and/or laboratory studies on MyChart before the doctors have had a chance to review them. We understand that in some cases there may be results that are confusing or concerning to you. Please understand that not all results are received at the same time and often the doctors may need to interpret multiple results in order to provide you with the best plan of care or course of treatment. Therefore, we ask that you please give [place] 2 business days to thoroughly review all your results before contacting the office for clarification. Should we see a critical lab result, you will be contacted sooner.   If You Need Anything After Your Visit  If you have any questions or concerns for your doctor, please call our main line at  [phone] If no one answers, please leave a voicemail as directed and we will return your call as soon as possible. Messages left after 4 pm will be answered the following business day.   You may also send [place] a message via MyChart. We typically respond to MyChart messages within 1-2 business days.  For prescription refills, please ask your pharmacy to contact our office. Our fax number is [phone].  If you have an urgent issue when the clinic is closed that cannot wait until the next business day, you can page your doctor at the number below.    Please note that while we do our best to be available for urgent issues outside of office hours, we are not available 24/7.   If you have an urgent issue and are unable to reach [place], you may choose to seek medical care at your doctor's office, retail clinic, urgent care center, or emergency room.  If you have a medical emergency, please immediately call 911 or go to the emergency department. In the event of inclement weather, please call our main line at [phone] for an update on the status of any delays or closures.  Dermatology Medication Tips: Please keep the boxes that topical medications come in in order to help keep track of the instructions about where and how to use these. Pharmacies typically print the medication instructions only on the boxes and not directly  on the medication tubes.   If your medication is too expensive, please contact our office at [phone] or send us  a message through MyChart.   We are unable to tell what your co-pay for medications will be in advance as this is different depending on your insurance coverage. However, we may be able to find a substitute medication at lower cost or fill out paperwork to get insurance to cover a needed medication.   If a prior authorization is required to get your medication covered by your insurance company, please allow us  1-2 business days to complete this process.  Drug  prices often vary depending on where the prescription is filled and some pharmacies may offer cheaper prices.  The website www.goodrx.com contains coupons for medications through different pharmacies. The prices here do not account for what the cost may be with help from insurance (it may be cheaper with your insurance), but the website can give you the price if you did not use any insurance.  - You can print the associated coupon and take it with your prescription to the pharmacy.  - You may also stop by our office during regular business hours and pick up a GoodRx coupon card.  - If you need your prescription sent electronically to a different pharmacy, notify our office through [facility] or by phone at [phone]

## 2023-05-15 NOTE — Progress Notes (Signed)
 Follow-Up Visit   Subjective  Jeremy Woodward is a 78 y.o. male who presents for the following: Skin Cancer Screening and Full Body Skin Exam - Hx of Aks. No history of skin cancer. Family history of skin cancer - not sure what type.  The patient presents for Total-Body Skin Exam (TBSE) for skin cancer screening and mole check. The patient has spots, moles and lesions to be evaluated, some may be new or changing and the patient may have concern these could be cancer.    The following portions of the chart were reviewed this encounter and updated as appropriate: medications, allergies, medical history  Review of Systems:  No other skin or systemic complaints except as noted in HPI or Assessment and Plan.  Objective  Well appearing patient in no apparent distress; mood and affect are within normal limits.  A full examination was performed including scalp, head, eyes, ears, nose, lips, neck, chest, axillae, abdomen, back, buttocks, bilateral upper extremities, bilateral lower extremities, hands, feet, fingers, toes, fingernails, and toenails. All findings within normal limits unless otherwise noted below.   Relevant physical exam findings are noted in the Assessment and Plan.  Left lower leg x 1, left forehead x 1, right forehead x 1 (3) Erythematous thin papules/macules with gritty scale.  Back (15) Erythematous stuck-on, waxy papule or plaque  Assessment & Plan   SKIN CANCER SCREENING PERFORMED TODAY.  ACTINIC DAMAGE - Chronic condition, secondary to cumulative UV/sun exposure - diffuse scaly erythematous macules with underlying dyspigmentation - Recommend daily broad spectrum sunscreen SPF 30+ to sun-exposed areas, reapply every 2 hours as needed.  - Staying in the shade or wearing long sleeves, sun glasses (UVA+UVB protection) and wide brim hats (4-inch brim around the entire circumference of the hat) are also recommended for sun protection.  - Call for new or changing  lesions.  LENTIGINES, SEBORRHEIC KERATOSES, HEMANGIOMAS - Benign normal skin lesions - Benign-appearing - Call for any changes  MELANOCYTIC NEVI - Tan-brown and/or pink-flesh-colored symmetric macules and papules - Benign appearing on exam today - Observation - Call clinic for new or changing moles - Recommend daily use of broad spectrum spf 30+ sunscreen to sun-exposed areas.       AK (ACTINIC KERATOSIS) (3) Left lower leg x 1, left forehead x 1, right forehead x 1 (3) Destruction of lesion - Left lower leg x 1, left forehead x 1, right forehead x 1 (3) Complexity: simple   Destruction method: cryotherapy   Informed consent: discussed and consent obtained   Timeout:  patient name, date of birth, surgical site, and procedure verified Lesion destroyed using liquid nitrogen: Yes   Region frozen until ice ball extended beyond lesion: Yes   Outcome: patient tolerated procedure well with no complications   Post-procedure details: wound care instructions given   INFLAMED SEBORRHEIC KERATOSIS (15) Back (15) Symptomatic, irritating, patient would like treated.  Benign-appearing.  Call clinic for new or changing lesions.   Destruction of lesion - Back (15) Complexity: simple   Destruction method: cryotherapy   Informed consent: discussed and consent obtained   Timeout:  patient name, date of birth, surgical site, and procedure verified Lesion destroyed using liquid nitrogen: Yes   Region frozen until ice ball extended beyond lesion: Yes   Outcome: patient tolerated procedure well with no complications   Post-procedure details: wound care instructions given   Return in about 6 months (around 11/14/2023) for AK follow up.  I, Joanie Coddington, CMA, am acting as  scribe for Louana Roup, DO .   Documentation: I have reviewed the above documentation for accuracy and completeness, and I agree with the above.  Louana Roup, DO

## 2023-05-19 NOTE — Progress Notes (Signed)
 HPI: FU coronary artery disease. History of empyema and VATS/decortication, HTN, and long-standing exertional shortness of breath. Abd ultrasound 2010 with no aneurysm. Patient reported somewhat increased exertional dyspnea as well as occasional chest pain episodes, so ETT was done in 1/13, showing 6:20 exercise with discontinuation due to profound dyspnea and some chest pain. Given very severe symptoms with ETT and progressive symptoms, LHC and RHC repeated and showed normal right and left heart filling pressure. LHC showed stable to improved coronary disease, with 40% distal LM and 40-50% proximal LAD. Since last seen, the patient has dyspnea with more extreme activities but not with routine activities. It is relieved with rest. It is not associated with chest pain. There is no orthopnea, PND or pedal edema. There is no syncope or palpitations. There is no exertional chest pain.   Current Outpatient Medications  Medication Sig Dispense Refill   ACIDOPHILUS LACTOBACILLUS PO Take by mouth.     AMBULATORY NON FORMULARY MEDICATION Medication Name: needs 2 Liter supplement overnight oxygen  for his CPAP.  Dx nocturnal hypoxemia. 1 Units PRN   Ascorbic Acid (VITAMIN C) 1000 MG tablet      aspirin  81 MG tablet Take 81 mg by mouth daily.     cholecalciferol (VITAMIN D3) 25 MCG (1000 UNIT) tablet      Cyanocobalamin  (VITAMIN B 12 PO) Take 1,000 mcg by mouth daily.     diclofenac Sodium (VOLTAREN) 1 % GEL diclofenac 1 % topical gel  APPLY 2 GRAMS TO THE AFFECTED AREA(S) BY TOPICAL ROUTE 4 TIMES PER DAY     diphenoxylate -atropine  (LOMOTIL ) 2.5-0.025 MG tablet Take 1 tablet by mouth 3 (three) times daily as needed for diarrhea or loose stools. 30 tablet 0   losartan  (COZAAR ) 50 MG tablet Take 1 tablet (50 mg total) by mouth daily. 90 tablet 3   meloxicam  (MOBIC ) 15 MG tablet Take 1 tablet (15 mg total) by mouth daily as needed for pain. 30 tablet 2   metFORMIN  (GLUCOPHAGE -XR) 500 MG 24 hr tablet TAKE 1  TABLET BY MOUTH EVERY DAY WITH BREAKFAST 90 tablet 1   pantoprazole  (PROTONIX ) 40 MG tablet TAKE 1 TABLET BY MOUTH EVERY DAY 90 tablet 3   rosuvastatin  (CRESTOR ) 40 MG tablet TAKE ONE TABLET BY MOUTH DAILY. ** OFFICE VISIT REQUIRED ** 90 tablet 0   sertraline  (ZOLOFT ) 50 MG tablet Take 1 tablet (50 mg total) by mouth daily. 100 tablet 3   zaleplon  (SONATA ) 10 MG capsule Take 1 capsule (10 mg total) by mouth at bedtime as needed for sleep. 30 capsule 1   No current facility-administered medications for this visit.     Past Medical History:  Diagnosis Date   Anxiety    Arthritis    BPH (benign prostatic hypertrophy)    CAD (coronary artery disease) 2010   Blockage in artery/ widow maker   Cataract    CHF (congestive heart failure) (HCC)    Depression    Diarrhea    GERD (gastroesophageal reflux disease)    Hyperlipidemia    Hypertension    Obesity    OSA on CPAP    Oxygen  deficiency    Pleurisy    Sleep apnea     Past Surgical History:  Procedure Laterality Date   LEFT AND RIGHT HEART CATHETERIZATION WITH CORONARY ANGIOGRAM N/A 03/05/2011   Procedure: LEFT AND RIGHT HEART CATHETERIZATION WITH CORONARY ANGIOGRAM;  Surgeon: Darlis Eisenmenger, MD;  Location: [facility];  Service: Cardiovascular;  Laterality: N/A;  LUNG DECORTICATION  1996   right with drainage utilizing VAT for empyema   TONSILLECTOMY      Social History   Socioeconomic History   Marital status: Married    Spouse name: Stana Ear   Number of children: 1   Years of education: 13   Highest education level: GED or equivalent  Occupational History   Occupation: Retired  Tobacco Use   Smoking status: Never    Passive exposure: Never   Smokeless tobacco: Never   Tobacco comments:    10/25/19 no smoking at all  Vaping Use   Vaping status: Never Used  Substance and Sexual Activity   Alcohol use: No   Drug use: No   Sexual activity: Not Currently    Birth control/protection: None  Other Topics Concern    Not on file  Social History Narrative   Lives with his wife. He has one child and one step-child. He enjoys yard work, walking and reading.   Social Drivers of Corporate investment banker Strain: Low Risk  (02/27/2023)   Overall Financial Resource Strain (CARDIA)    Difficulty of Paying Living Expenses: Not hard at all  Food Insecurity: No Food Insecurity (02/27/2023)   Hunger Vital Sign    Worried About Running Out of Food in the Last Year: Never true    Ran Out of Food in the Last Year: Never true  Transportation Needs: No Transportation Needs (02/27/2023)   PRAPARE - Administrator, Civil Service (Medical): No    Lack of Transportation (Non-Medical): No  Physical Activity: Inactive (02/27/2023)   Exercise Vital Sign    Days of Exercise per Week: 0 days    Minutes of Exercise per Session: 30 min  Stress: Stress Concern Present (02/27/2023)   [employer] of Occupational Health - Occupational Stress Questionnaire    Feeling of Stress : To some extent  Social Connections: Socially Integrated (02/27/2023)   Social Connection and Isolation Panel [NHANES]    Frequency of Communication with Friends and Family: Twice a week    Frequency of Social Gatherings with Friends and Family: Twice a week    Attends Religious Services: More than 4 times per year    Active Member of [employer] or Organizations: Yes    Attends Engineer, structural: More than 4 times per year    Marital Status: Married  Catering manager Violence: Not At Risk (11/28/2021)   Humiliation, Afraid, Rape, and Kick questionnaire    Fear of Current or Ex-Partner: No    Emotionally Abused: No    Physically Abused: No    Sexually Abused: No    Family History  Problem Relation Age of Onset   Cancer Father 74       liver and lung   Heart disease Mother    Ulcers Mother    Hypertension Mother    Aneurysm Mother    Miscarriages / Stillbirths Mother    Obesity Mother    Hyperlipidemia Sister     Hypertension Sister    Colon cancer Maternal Grandmother        late 59's when dx    Hypertension Brother    Esophageal cancer Neg Hx    Stomach cancer Neg Hx    Rectal cancer Neg Hx     ROS: no fevers or chills, productive cough, hemoptysis, dysphasia, odynophagia, melena, hematochezia, dysuria, hematuria, rash, seizure activity, orthopnea, PND, pedal edema, claudication. Remaining systems are negative.  Physical Exam: Well-developed well-nourished in no  acute distress.  Skin is warm and dry.  HEENT is normal.  Neck is supple.  Chest is clear to auscultation with normal expansion.  Cardiovascular exam is regular rate and rhythm.  Abdominal exam nontender or distended. No masses palpated. Extremities show no edema. neuro grossly intact  EKG Interpretation Date/Time:  Monday Jun 02 2023 08:20:48 EDT Ventricular Rate:  63 PR Interval:  180 QRS Duration:  98 QT Interval:  434 QTC Calculation: 444 R Axis:   25  Text Interpretation: Normal sinus rhythm Incomplete right bundle branch block Nonspecific ST abnormality Confirmed by Alexandria Angel ([id number]) on 06/02/2023 8:22:20 AM    A/P  1 coronary artery disease-patient continues to do well from a symptomatic standpoint with no chest pain.  Continue medical therapy with aspirin  and statin.  2 hyperlipidemia-continue statin.  Check lipids and liver.  3 hypertension-patient's blood pressure is controlled.  Continue present medical regimen.  Check potassium and renal function.  4 fatigue patient complains of some fatigue.  Check TSH, hemoglobin and B12.  Alexandria Angel, MD

## 2023-05-27 ENCOUNTER — Encounter (INDEPENDENT_AMBULATORY_CARE_PROVIDER_SITE_OTHER): Payer: Medicare HMO | Admitting: Ophthalmology

## 2023-05-30 DIAGNOSIS — R0902 Hypoxemia: Secondary | ICD-10-CM | POA: Diagnosis not present

## 2023-05-30 DIAGNOSIS — G4734 Idiopathic sleep related nonobstructive alveolar hypoventilation: Secondary | ICD-10-CM | POA: Diagnosis not present

## 2023-06-02 ENCOUNTER — Ambulatory Visit: Payer: Medicare HMO | Admitting: Cardiology

## 2023-06-02 ENCOUNTER — Encounter: Payer: Self-pay | Admitting: Cardiology

## 2023-06-02 VITALS — BP 125/66 | HR 63 | Ht 75.0 in | Wt 251.0 lb

## 2023-06-02 DIAGNOSIS — R5383 Other fatigue: Secondary | ICD-10-CM

## 2023-06-02 DIAGNOSIS — I2510 Atherosclerotic heart disease of native coronary artery without angina pectoris: Secondary | ICD-10-CM | POA: Diagnosis not present

## 2023-06-02 DIAGNOSIS — I10 Essential (primary) hypertension: Secondary | ICD-10-CM

## 2023-06-02 DIAGNOSIS — E7800 Pure hypercholesterolemia, unspecified: Secondary | ICD-10-CM | POA: Diagnosis not present

## 2023-06-02 NOTE — Patient Instructions (Signed)

## 2023-06-03 ENCOUNTER — Encounter: Payer: Self-pay | Admitting: *Deleted

## 2023-06-03 LAB — COMPREHENSIVE METABOLIC PANEL WITH GFR
ALT: 26 IU/L (ref 0–44)
AST: 25 IU/L (ref 0–40)
Albumin: 4.5 g/dL (ref 3.8–4.8)
Alkaline Phosphatase: 61 IU/L (ref 44–121)
BUN/Creatinine Ratio: 16 (ref 10–24)
BUN: 21 mg/dL (ref 8–27)
Bilirubin Total: 0.4 mg/dL (ref 0.0–1.2)
CO2: 18 mmol/L — ABNORMAL LOW (ref 20–29)
Calcium: 9.6 mg/dL (ref 8.6–10.2)
Chloride: 106 mmol/L (ref 96–106)
Creatinine, Ser: 1.28 mg/dL — ABNORMAL HIGH (ref 0.76–1.27)
Globulin, Total: 1.9 g/dL (ref 1.5–4.5)
Glucose: 104 mg/dL — ABNORMAL HIGH (ref 70–99)
Potassium: 4.8 mmol/L (ref 3.5–5.2)
Sodium: 142 mmol/L (ref 134–144)
Total Protein: 6.4 g/dL (ref 6.0–8.5)
eGFR: 57 mL/min/{1.73_m2} — ABNORMAL LOW (ref >59)

## 2023-06-03 LAB — TSH: TSH: 3.12 u[IU]/mL (ref 0.450–4.500)

## 2023-06-03 LAB — CBC
Hematocrit: 40.7 % (ref 37.5–51.0)
Hemoglobin: 14 g/dL (ref 13.0–17.7)
MCH: 30.9 pg (ref 26.6–33.0)
MCHC: 34.4 g/dL (ref 31.5–35.7)
MCV: 90 fL (ref 79–97)
Platelets: 244 10*3/uL (ref 150–450)
RBC: 4.53 x10E6/uL (ref 4.14–5.80)
RDW: 11.7 % (ref 11.6–15.4)
WBC: 6.1 10*3/uL (ref 3.4–10.8)

## 2023-06-03 LAB — LIPID PANEL
Chol/HDL Ratio: 2.7 ratio (ref 0.0–5.0)
Cholesterol, Total: 118 mg/dL (ref 100–199)
HDL: 44 mg/dL (ref >39)
LDL Chol Calc (NIH): 53 mg/dL (ref 0–99)
Triglycerides: 115 mg/dL (ref 0–149)
VLDL Cholesterol Cal: 21 mg/dL (ref 5–40)

## 2023-06-03 LAB — VITAMIN B12: Vitamin B-12: 484 pg/mL (ref 232–1245)

## 2023-06-05 ENCOUNTER — Other Ambulatory Visit: Payer: Self-pay | Admitting: Family Medicine

## 2023-06-05 DIAGNOSIS — F5101 Primary insomnia: Secondary | ICD-10-CM

## 2023-06-05 NOTE — Telephone Encounter (Signed)
Rx not listed in active med list.  

## 2023-06-05 NOTE — Telephone Encounter (Signed)
 Please call patient to clarify.  He has an active prescription for Sonata  10 mg so is he taking that or is he taking his old Ambien ?

## 2023-06-06 NOTE — Telephone Encounter (Signed)
 The original prescription was discontinued on 10/28/2022 by you.  Last OV 02/27/2023  Upcoming appointment 07/24/2023

## 2023-06-11 ENCOUNTER — Encounter: Payer: Self-pay | Admitting: Cardiology

## 2023-06-13 NOTE — Telephone Encounter (Signed)
 Ok will change back rx  Meds ordered this encounter  Medications   zolpidem  (AMBIEN ) 10 MG tablet    Sig: TAKE 1/2 TO 1 TABLET FOR PAIN EVERY EVENING AT BEDTIME AS NEEDED    Dispense:  90 tablet    Refill:  0

## 2023-06-13 NOTE — Telephone Encounter (Signed)
 Per patient, the sonata  rx was expensive and did not work. Patient prefers to take Ambien  for sleep as it provided better results for him.

## 2023-06-19 ENCOUNTER — Other Ambulatory Visit: Payer: Self-pay | Admitting: Cardiology

## 2023-06-30 DIAGNOSIS — R0902 Hypoxemia: Secondary | ICD-10-CM | POA: Diagnosis not present

## 2023-06-30 DIAGNOSIS — G4734 Idiopathic sleep related nonobstructive alveolar hypoventilation: Secondary | ICD-10-CM | POA: Diagnosis not present

## 2023-07-24 ENCOUNTER — Ambulatory Visit (INDEPENDENT_AMBULATORY_CARE_PROVIDER_SITE_OTHER): Payer: Medicare HMO | Admitting: Family Medicine

## 2023-07-24 ENCOUNTER — Encounter: Payer: Self-pay | Admitting: Family Medicine

## 2023-07-24 VITALS — BP 126/50 | HR 68 | Ht 75.0 in | Wt 250.1 lb

## 2023-07-24 DIAGNOSIS — F324 Major depressive disorder, single episode, in partial remission: Secondary | ICD-10-CM

## 2023-07-24 DIAGNOSIS — N1831 Chronic kidney disease, stage 3a: Secondary | ICD-10-CM | POA: Diagnosis not present

## 2023-07-24 DIAGNOSIS — R7301 Impaired fasting glucose: Secondary | ICD-10-CM | POA: Diagnosis not present

## 2023-07-24 DIAGNOSIS — I10 Essential (primary) hypertension: Secondary | ICD-10-CM

## 2023-07-24 DIAGNOSIS — R972 Elevated prostate specific antigen [PSA]: Secondary | ICD-10-CM

## 2023-07-24 DIAGNOSIS — G4700 Insomnia, unspecified: Secondary | ICD-10-CM

## 2023-07-24 LAB — POCT GLYCOSYLATED HEMOGLOBIN (HGB A1C): Hemoglobin A1C: 5.4 % (ref 4.0–5.6)

## 2023-07-24 NOTE — Assessment & Plan Note (Signed)
 Pressure looks fantastic today.  Continue current regimen.  Continue to work on incorporating regular exercise.

## 2023-07-24 NOTE — Assessment & Plan Note (Signed)
 Continue losartan  for renal protection and continue to follow serum creatinine every 6 months.

## 2023-07-24 NOTE — Assessment & Plan Note (Signed)
 A1c looks absolutely fantastic at 5.4 he is really kept it down for the last 6 months so organ to go ahead and discontinue the metformin  continue to work on healthy diet and regular exercise.

## 2023-07-24 NOTE — Assessment & Plan Note (Signed)
 Is really doing fantastic on half of a tab of his Ambien .  He would like to keep it at the tens so that he can just split them or occasionally take a whole if needed I think that is reasonable.

## 2023-07-24 NOTE — Progress Notes (Signed)
 Established Patient Office Visit  Subjective  Patient ID: Jeremy Woodward, male    DOB: 07-26-1945  Age: 78 y.o. MRN: [id number]  Chief Complaint  Patient presents with   Hypertension   ifg    HPI   Hypertension- Pt denies chest pain, SOB, dizziness, or heart palpitations.  Taking meds as directed w/o problems.  Denies medication side effects.    Impaired fasting glucose-no increased thirst or urination. No symptoms consistent with hypoglycemia.  He would like to re lose more weight. He has upped his walking to 4000-5000 steps per day.  He has been using some 5 pound weights on his upper arms.  Follow-up insomnia-he is actually down to half a tab on the Ambien  I have sent in a prescription to try to change around his medication because he felt like it was not as effective.  But they were too costly so he ended up just stopping the Ambien  after a few nights he says the first night he did not sleep at all the second night he slept a little bit he ended up restarting a half a tab of the Ambien  and is actually been sleeping okay since.  He said he is only had once that he had to take a full tab.    ROS    Objective:     BP (!) 126/50   Pulse 68   Ht 6' 3 (1.905 m)   Wt 250 lb 1.9 oz (113.5 kg)   SpO2 98%   BMI 31.26 kg/m    Physical Exam Vitals and nursing note reviewed.  Constitutional:      Appearance: Normal appearance.  HENT:     Head: Normocephalic and atraumatic.   Eyes:     Conjunctiva/sclera: Conjunctivae normal.   Neck:     Vascular: No carotid bruit.   Cardiovascular:     Rate and Rhythm: Normal rate and regular rhythm.  Pulmonary:     Effort: Pulmonary effort is normal.     Breath sounds: Normal breath sounds.   Skin:    General: Skin is warm and dry.   Neurological:     Mental Status: He is alert.   Psychiatric:        Mood and Affect: Mood normal.      Results for orders placed or performed in visit on 07/24/23  POCT HgB A1C  Result  Value Ref Range   Hemoglobin A1C 5.4 4.0 - 5.6 %   HbA1c POC (<> result, manual entry)     HbA1c, POC (prediabetic range)     HbA1c, POC (controlled diabetic range)        The ASCVD Risk score (Arnett DK, et al., 2019) failed to calculate for the following reasons:   The valid total cholesterol range is 130 to 320 mg/dL    Assessment & Plan:   Problem List Items Addressed This Visit       Cardiovascular and Mediastinum   Benign essential hypertension   Pressure looks fantastic today.  Continue current regimen.  Continue to work on incorporating regular exercise.        Endocrine   IFG (impaired fasting glucose) - Primary   A1c looks absolutely fantastic at 5.4 he is really kept it down for the last 6 months so organ to go ahead and discontinue the metformin  continue to work on healthy diet and regular exercise.      Relevant Orders   POCT HgB A1C (Completed)     Genitourinary  CKD (chronic kidney disease) stage 3, GFR 30-59 ml/min (HCC)   Continue losartan  for renal protection and continue to follow serum creatinine every 6 months.        Other   Insomnia (Chronic)   Is really doing fantastic on half of a tab of his Ambien .  He would like to keep it at the tens so that he can just split them or occasionally take a whole if needed I think that is reasonable.      Depression, major, single episode, in partial remission (HCC)   He says he is also cut down on his sertraline .  He is taking 50 mg about 3 days/week.  He had some big stressors on his shoulders for about 8 months and has felt better since then he thinks that might even have improved his sleep quality.  I encouraged him to start taking 25 mg daily and then taper to every other day and then 3 days a week and then completely off if he is doing well.      Other Visit Diagnoses       Elevated PSA       Relevant Orders   PSA      Labs in December showed a mildly elevated PSA was still technically in the  normal range but it was a big jump based on his baseline so we discussed repeating it in 6 months which would be today.   Return in about 6 months (around 01/23/2024) for Pre-diabetes, Hypertension.    Dorothyann Byars, MD

## 2023-07-24 NOTE — Assessment & Plan Note (Signed)
 He says he is also cut down on his sertraline .  He is taking 50 mg about 3 days/week.  He had some big stressors on his shoulders for about 8 months and has felt better since then he thinks that might even have improved his sleep quality.  I encouraged him to start taking 25 mg daily and then taper to every other day and then 3 days a week and then completely off if he is doing well.

## 2023-07-25 ENCOUNTER — Ambulatory Visit: Payer: Self-pay | Admitting: Family Medicine

## 2023-07-25 LAB — PSA: Prostate Specific Ag, Serum: 3.2 ng/mL (ref 0.0–4.0)

## 2023-07-25 NOTE — Progress Notes (Signed)
 Hi Jeremy Woodward, PSA came down a little bit it was 3.8 now 3.2.  Which is great so it is going in the right direction so we will plan to recheck again in 1 year.

## 2023-07-30 DIAGNOSIS — R0902 Hypoxemia: Secondary | ICD-10-CM | POA: Diagnosis not present

## 2023-07-30 DIAGNOSIS — G4734 Idiopathic sleep related nonobstructive alveolar hypoventilation: Secondary | ICD-10-CM | POA: Diagnosis not present

## 2023-08-30 DIAGNOSIS — R0902 Hypoxemia: Secondary | ICD-10-CM | POA: Diagnosis not present

## 2023-08-30 DIAGNOSIS — G4734 Idiopathic sleep related nonobstructive alveolar hypoventilation: Secondary | ICD-10-CM | POA: Diagnosis not present

## 2023-09-30 DIAGNOSIS — G4734 Idiopathic sleep related nonobstructive alveolar hypoventilation: Secondary | ICD-10-CM | POA: Diagnosis not present

## 2023-09-30 DIAGNOSIS — R0902 Hypoxemia: Secondary | ICD-10-CM | POA: Diagnosis not present

## 2023-10-29 DIAGNOSIS — H35341 Macular cyst, hole, or pseudohole, right eye: Secondary | ICD-10-CM | POA: Diagnosis not present

## 2023-10-29 DIAGNOSIS — H35371 Puckering of macula, right eye: Secondary | ICD-10-CM | POA: Diagnosis not present

## 2023-10-29 DIAGNOSIS — H18513 Endothelial corneal dystrophy, bilateral: Secondary | ICD-10-CM | POA: Diagnosis not present

## 2023-10-29 DIAGNOSIS — H43813 Vitreous degeneration, bilateral: Secondary | ICD-10-CM | POA: Diagnosis not present

## 2023-10-29 DIAGNOSIS — H43393 Other vitreous opacities, bilateral: Secondary | ICD-10-CM | POA: Diagnosis not present

## 2023-10-29 LAB — OPHTHALMOLOGY REPORT-SCANNED

## 2023-10-30 DIAGNOSIS — R0902 Hypoxemia: Secondary | ICD-10-CM | POA: Diagnosis not present

## 2023-10-30 DIAGNOSIS — G4734 Idiopathic sleep related nonobstructive alveolar hypoventilation: Secondary | ICD-10-CM | POA: Diagnosis not present

## 2023-11-02 ENCOUNTER — Other Ambulatory Visit: Payer: Self-pay | Admitting: Family Medicine

## 2023-11-02 DIAGNOSIS — F324 Major depressive disorder, single episode, in partial remission: Secondary | ICD-10-CM

## 2023-11-02 DIAGNOSIS — F4010 Social phobia, unspecified: Secondary | ICD-10-CM

## 2023-11-17 ENCOUNTER — Ambulatory Visit: Admitting: Dermatology

## 2023-11-24 ENCOUNTER — Other Ambulatory Visit: Payer: Self-pay | Admitting: Family Medicine

## 2023-11-24 DIAGNOSIS — F5101 Primary insomnia: Secondary | ICD-10-CM

## 2023-11-30 DIAGNOSIS — G4734 Idiopathic sleep related nonobstructive alveolar hypoventilation: Secondary | ICD-10-CM | POA: Diagnosis not present

## 2023-11-30 DIAGNOSIS — R0902 Hypoxemia: Secondary | ICD-10-CM | POA: Diagnosis not present

## 2023-12-02 ENCOUNTER — Ambulatory Visit: Admitting: Dermatology

## 2023-12-02 VITALS — BP 120/68

## 2023-12-02 DIAGNOSIS — W908XXA Exposure to other nonionizing radiation, initial encounter: Secondary | ICD-10-CM | POA: Diagnosis not present

## 2023-12-02 DIAGNOSIS — L820 Inflamed seborrheic keratosis: Secondary | ICD-10-CM

## 2023-12-02 DIAGNOSIS — L570 Actinic keratosis: Secondary | ICD-10-CM

## 2023-12-02 DIAGNOSIS — L578 Other skin changes due to chronic exposure to nonionizing radiation: Secondary | ICD-10-CM

## 2023-12-02 MED ORDER — FLUOROURACIL 5 % EX CREA: TOPICAL_CREAM | Freq: Two times a day (BID) | CUTANEOUS | 0 refills | Status: DC

## 2023-12-02 MED ORDER — HYDROCORTISONE 2.5 % EX OINT: TOPICAL_OINTMENT | Freq: Two times a day (BID) | CUTANEOUS | 3 refills | Status: DC

## 2023-12-02 NOTE — Progress Notes (Signed)
 Follow-Up Visit   Subjective  Jeremy Woodward is a 78 y.o. male who presents for the following: Actinic keratosis.    Jeremy Woodward last saw us  on 05/15/23 for skin cancer screening. He had multiple Actinic Keratosis treated with LN2 located on the L Lower Leg, L side of Forehead and R side of forehead. He states that these spots have healed well. Area of concern on L eyebrow  He also had irritated seborrheic keratosis treated with LN2 on the back as well. Jeremy Woodward complains of one itchy spot on his back he would like looked at today.  The patient has spots, moles and lesions to be evaluated, some may be new or changing and the patient may have concern these could be cancer.   The following portions of the chart were reviewed this encounter and updated as appropriate: medications, allergies, medical history  Review of Systems:  No other skin or systemic complaints except as noted in HPI or Assessment and Plan.  Objective  Well appearing patient in no apparent distress; mood and affect are within normal limits.  A focused examination was performed of the following areas: Face, Back  Relevant exam findings are noted in the Assessment and Plan.  Left Forearm - Anterior (3), Right Forehead, Right Temple Erythematous thin papules/macules with gritty scale.  Mid Back Irritated stuck on waxy plaque  Assessment & Plan   Actinic keratosis Multiple actinic keratoses on the left temple, right temple, right forehead, and left forearm. Previous cryotherapy on the left lower leg and right forehead was effective. New lesions on the left temple require treatment. Discussed field therapy with 5-fluorouracil cream to prevent future lesions, emphasizing its use during winter months due to sun exposure risks. Explained potential side effects, including redness and irritation, and the need for a healing ointment post-treatment. He prefers to start treatment in January after the holiday season. - Performed  cryotherapy on new actinic keratoses on the left temple, right temple, and right forehead. - Prescribed 5-fluorouracil cream 5% to be applied as a thin layer morning and night for two weeks, starting in January. - Prescribed a healing ointment-based steroid cream for two weeks post-5-fluorouracil treatment to reduce inflammation and redness.  Inflamed seborrheic keratosis Itchy seborrheic keratosis on the back, causing discomfort. Previous treatments with lotion provided temporary relief. - Performed cryotherapy on the inflamed seborrheic keratosis on the back. - Advised application of Aquaphor to the treated area to aid healing. AK (ACTINIC KERATOSIS) (5) Left Forearm - Anterior (3), Right Forehead, Right Temple Destruction of lesion - Right Forehead, Right Temple Complexity: simple   Destruction method: cryotherapy   Informed consent: discussed and consent obtained   Timeout:  patient name, date of birth, surgical site, and procedure verified Lesion destroyed using liquid nitrogen: Yes   Region frozen until ice ball extended beyond lesion: Yes   Outcome: patient tolerated procedure well with no complications   Post-procedure details: wound care instructions given    INFLAMED SEBORRHEIC KERATOSIS Mid Back Destruction of lesion - Mid Back Complexity: simple   Destruction method: cryotherapy   Informed consent: discussed and consent obtained   Timeout:  patient name, date of birth, surgical site, and procedure verified Lesion destroyed using liquid nitrogen: Yes   Region frozen until ice ball extended beyond lesion: Yes   Outcome: patient tolerated procedure well with no complications   Post-procedure details: wound care instructions given     ACTINIC KERATOSIS  Exam: Erythematous thin papules/macules with gritty scale  Actinic keratoses  are precancerous spots that appear secondary to cumulative UV radiation exposure/sun exposure over time. They are chronic with expected duration  over 1 year. A portion of actinic keratoses will progress to squamous cell carcinoma of the skin. It is not possible to reliably predict which spots will progress to skin cancer and so treatment is recommended to prevent development of skin cancer.  Recommend daily broad spectrum sunscreen SPF 30+ to sun-exposed areas, reapply every 2 hours as needed.  Recommend staying in the shade or wearing long sleeves, sun glasses (UVA+UVB protection) and wide brim hats (4-inch brim around the entire circumference of the hat). Call for new or changing lesions.  Treatment Plan:  Will prescribe Fluorouracil 5% cream to apply in January. Apply to face twice a day for two weeks Apply hydrocortisone ointment twice a day for two weeks after 5FU treatment  ACTINIC DAMAGE - chronic, secondary to cumulative UV radiation exposure/sun exposure over time - diffuse scaly erythematous macules with underlying dyspigmentation - Recommend daily broad spectrum sunscreen SPF 30+ to sun-exposed areas, reapply every 2 hours as needed.  - Recommend staying in the shade or wearing long sleeves, sun glasses (UVA+UVB protection) and wide brim hats (4-inch brim around the entire circumference of the hat). - Call for new or changing lesions.   Return in about 6 months (around 05/31/2024) for 5-6 months for 1 yr tbse.  I, Gordan Beams, CMA, am acting as scribe for [employer], DO.   Documentation: I have reviewed the above documentation for accuracy and completeness, and I agree with the above.  Delon Lenis, DO

## 2023-12-02 NOTE — Patient Instructions (Addendum)
 VISIT SUMMARY:  Today, you came in for a follow-up on your precancerous skin lesions. We discussed the progress of your previous treatments and addressed new concerns.  YOUR PLAN:  -ACTINIC KERATOSIS:  Actinic keratosis is a rough, scaly patch on your skin caused by years of sun exposure. We treated the new lesions on your left temple, right temple, and right forehead with cryotherapy. You will start using 5-fluorouracil cream in January to prevent future lesions. Apply it as a thin layer morning and night for two weeks, and then use the hydrocortisone 2.5% healing ointment-based steroid cream for two weeks to reduce inflammation and redness.  -INFLAMED SEBORRHEIC KERATOSIS: Seborrheic keratosis is a common, non-cancerous skin growth that can become inflamed and itchy. We treated the itchy spot on your back with cryotherapy. Apply Aquaphor to the treated area to help it heal.  INSTRUCTIONS:  Start using the 5-fluorouracil cream in January as discussed. Apply it twice daily for two weeks, followed by the healing ointment-based steroid cream for another two weeks. Continue using Aquaphor on the treated area on your back to aid healing. Follow up with us  if you notice any new or worsening skin lesions. Cryotherapy Aftercare  Wash gently with soap and water everyday.   Apply Vaseline and Band-Aid daily until healed.    Important Information  Due to recent changes in healthcare laws, you may see results of your pathology and/or laboratory studies on MyChart before the doctors have had a chance to review them. We understand that in some cases there may be results that are confusing or concerning to you. Please understand that not all results are received at the same time and often the doctors may need to interpret multiple results in order to provide you with the best plan of care or course of treatment. Therefore, we ask that you please give us  2 business days to thoroughly review all your results  before contacting the office for clarification. Should we see a critical lab result, you will be contacted sooner.   If You Need Anything After Your Visit  If you have any questions or concerns for your doctor, please call our main line at [phone] If no one answers, please leave a voicemail as directed and we will return your call as soon as possible. Messages left after 4 pm will be answered the following business day.   You may also send us  a message via MyChart. We typically respond to MyChart messages within 1-2 business days.  For prescription refills, please ask your pharmacy to contact our office. Our fax number is [phone].  If you have an urgent issue when the clinic is closed that cannot wait until the next business day, you can page your doctor at the number below.    Please note that while we do our best to be available for urgent issues outside of office hours, we are not available 24/7.   If you have an urgent issue and are unable to reach us , you may choose to seek medical care at your doctor's office, retail clinic, urgent care center, or emergency room.  If you have a medical emergency, please immediately call 911 or go to the emergency department. In the event of inclement weather, please call our main line at [phone] for an update on the status of any delays or closures.  Dermatology Medication Tips: Please keep the boxes that topical medications come in in order to help keep track of the instructions about where and how to use  these. Pharmacies typically print the medication instructions only on the boxes and not directly on the medication tubes.   If your medication is too expensive, please contact our office at [phone] or send us  a message through MyChart.   We are unable to tell what your co-pay for medications will be in advance as this is different depending on your insurance coverage. However, we may be able to find a substitute medication at  lower cost or fill out paperwork to get insurance to cover a needed medication.   If a prior authorization is required to get your medication covered by your insurance company, please allow us  1-2 business days to complete this process.  Drug prices often vary depending on where the prescription is filled and some pharmacies may offer cheaper prices.  The website www.goodrx.com contains coupons for medications through different pharmacies. The prices here do not account for what the cost may be with help from insurance (it may be cheaper with your insurance), but the website can give you the price if you did not use any insurance.  - You can print the associated coupon and take it with your prescription to the pharmacy.  - You may also stop by our office during regular business hours and pick up a GoodRx coupon card.  - If you need your prescription sent electronically to a different pharmacy, notify our office through [facility] or by phone at [phone]

## 2023-12-03 ENCOUNTER — Ambulatory Visit (INDEPENDENT_AMBULATORY_CARE_PROVIDER_SITE_OTHER): Admitting: Family Medicine

## 2023-12-03 DIAGNOSIS — Z23 Encounter for immunization: Secondary | ICD-10-CM

## 2023-12-07 ENCOUNTER — Encounter: Payer: Self-pay | Admitting: Dermatology

## 2023-12-08 DIAGNOSIS — H52203 Unspecified astigmatism, bilateral: Secondary | ICD-10-CM | POA: Diagnosis not present

## 2023-12-08 DIAGNOSIS — H18513 Endothelial corneal dystrophy, bilateral: Secondary | ICD-10-CM | POA: Diagnosis not present

## 2023-12-08 DIAGNOSIS — Z961 Presence of intraocular lens: Secondary | ICD-10-CM | POA: Diagnosis not present

## 2023-12-08 DIAGNOSIS — H35371 Puckering of macula, right eye: Secondary | ICD-10-CM | POA: Diagnosis not present

## 2023-12-30 DIAGNOSIS — G4734 Idiopathic sleep related nonobstructive alveolar hypoventilation: Secondary | ICD-10-CM | POA: Diagnosis not present

## 2023-12-30 DIAGNOSIS — R0902 Hypoxemia: Secondary | ICD-10-CM | POA: Diagnosis not present

## 2024-01-27 ENCOUNTER — Encounter: Payer: Self-pay | Admitting: Family Medicine

## 2024-01-27 ENCOUNTER — Ambulatory Visit (INDEPENDENT_AMBULATORY_CARE_PROVIDER_SITE_OTHER): Admitting: Family Medicine

## 2024-01-27 ENCOUNTER — Ambulatory Visit (INDEPENDENT_AMBULATORY_CARE_PROVIDER_SITE_OTHER)

## 2024-01-27 VITALS — BP 127/52 | HR 57 | Ht 75.0 in | Wt 261.1 lb

## 2024-01-27 VITALS — BP 127/52 | HR 57 | Ht 75.0 in | Wt 261.0 lb

## 2024-01-27 DIAGNOSIS — N1831 Chronic kidney disease, stage 3a: Secondary | ICD-10-CM | POA: Diagnosis not present

## 2024-01-27 DIAGNOSIS — Z125 Encounter for screening for malignant neoplasm of prostate: Secondary | ICD-10-CM

## 2024-01-27 DIAGNOSIS — I10 Essential (primary) hypertension: Secondary | ICD-10-CM

## 2024-01-27 DIAGNOSIS — H9193 Unspecified hearing loss, bilateral: Secondary | ICD-10-CM

## 2024-01-27 DIAGNOSIS — Z0000 Encounter for general adult medical examination without abnormal findings: Secondary | ICD-10-CM

## 2024-01-27 DIAGNOSIS — R7301 Impaired fasting glucose: Secondary | ICD-10-CM | POA: Diagnosis not present

## 2024-01-27 DIAGNOSIS — I129 Hypertensive chronic kidney disease with stage 1 through stage 4 chronic kidney disease, or unspecified chronic kidney disease: Secondary | ICD-10-CM | POA: Diagnosis not present

## 2024-01-27 DIAGNOSIS — F324 Major depressive disorder, single episode, in partial remission: Secondary | ICD-10-CM

## 2024-01-27 LAB — POCT GLYCOSYLATED HEMOGLOBIN (HGB A1C): Hemoglobin A1C: 5.5 % (ref 4.0–5.6)

## 2024-01-27 NOTE — Patient Instructions (Signed)
 Mr. Muzquiz,  Thank you for taking the time for your Medicare Wellness Visit. I appreciate your continued commitment to your health goals. Please review the care plan we discussed, and feel free to reach out if I can assist you further.  Please note that Annual Wellness Visits do not include a physical exam. Some assessments may be limited, especially if the visit was conducted virtually. If needed, we may recommend an in-person follow-up with your provider.  Ongoing Care Seeing your primary care provider every 3 to 6 months helps us  monitor your health and provide consistent, personalized care.   Referrals If a referral was made during today's visit and you haven't received any updates within two weeks, please contact the referred provider directly to check on the status.  Recommended Screenings:  Health Maintenance  Topic Date Due   Medicare Annual Wellness Visit  11/29/2022   COVID-19 Vaccine (5 - 2025-26 season) 09/29/2023   DTaP/Tdap/Td vaccine (5 - Td or Tdap) 08/15/2032   Pneumococcal Vaccine for age over 57  Completed   Flu Shot  Completed   Hepatitis C Screening  Completed   Zoster (Shingles) Vaccine  Completed   Meningitis B Vaccine  Aged Out   Colon Cancer Screening  Discontinued       01/27/2024    9:45 AM  Advanced Directives  Does Patient Have a Medical Advance Directive? No  Would patient like information on creating a medical advance directive? No - Patient declined    Vision: Annual vision screenings are recommended for early detection of glaucoma, cataracts, and diabetic retinopathy. These exams can also reveal signs of chronic conditions such as diabetes and high blood pressure.  Dental: Annual dental screenings help detect early signs of oral cancer, gum disease, and other conditions linked to overall health, including heart disease and diabetes.  Please see the attached documents for additional preventive care recommendations.

## 2024-01-27 NOTE — Progress Notes (Signed)
 "  Chief Complaint  Patient presents with   Medicare Wellness     Subjective:   Jeremy Woodward is a 78 y.o. male who presents for a Medicare Annual Wellness Visit.  Visit info / Clinical Intake: Medicare Wellness Visit Type:: Subsequent Annual Wellness Visit Persons participating in visit and providing information:: patient Medicare Wellness Visit Mode:: In-person (required for WTM) Interpreter Needed?: No Pre-visit prep was completed: yes AWV questionnaire completed by patient prior to visit?: no Living arrangements:: lives with spouse/significant other Patient's Overall Health Status Rating: good Typical amount of pain: some Does pain affect daily life?: no Are you currently prescribed opioids?: no  Dietary Habits and Nutritional Risks How many meals a day?: 2 Eats fruit and vegetables daily?: yes Most meals are obtained by: preparing own meals; eating out In the last 2 weeks, have you had any of the following?: none Diabetic:: no  Functional Status Activities of Daily Living (to include ambulation/medication): Independent Ambulation: Independent Medication Administration: Independent Home Management (perform basic housework or laundry): Independent Primary transportation is: driving Concerns about vision?: (!) yes Concerns about hearing?: (!) yes Uses hearing aids?: no Hear whispered voice?: (!) no *in-person visit only*  Fall Screening Falls in the past year?: 0 Number of falls in past year: 0 Was there an injury with Fall?: 0 Fall Risk Category Calculator: 0 Patient Fall Risk Level: Low Fall Risk  Fall Risk Patient at Risk for Falls Due to: No Fall Risks Fall risk Follow up: Falls evaluation completed  Home and Transportation Safety: All rugs have non-skid backing?: yes All stairs or steps have railings?: yes Grab bars in the bathtub or shower?: (!) no Have non-skid surface in bathtub or shower?: yes Good home lighting?: yes Regular seat belt use?:  yes Hospital stays in the last year:: no  Cognitive Assessment Difficulty concentrating, remembering, or making decisions? : yes Will 6CIT or Mini Cog be Completed: yes What year is it?: 0 points What month is it?: 0 points Give patient an address phrase to remember (5 components): [address] [place], [place] [address] About what time is it?: 0 points Count backwards from 20 to 1: 0 points Say the months of the year in reverse: 2 points Repeat the address phrase from earlier: 0 points 6 CIT Score: 2 points  Advance Directives (For Healthcare) Does Patient Have a Medical Advance Directive?: No Would patient like information on creating a medical advance directive?: No - Patient declined  Reviewed/Updated  Reviewed/Updated: Medical History; Surgical History; Medications; Allergies; Care Teams; Patient Goals    Allergies (verified) Patient has no known allergies.   Current Medications (verified) Outpatient Encounter Medications as of 01/27/2024  Medication Sig   ACIDOPHILUS LACTOBACILLUS PO Take by mouth.   AMBULATORY NON FORMULARY MEDICATION Medication Name: needs 2 Liter supplement overnight oxygen  for his CPAP.  Dx nocturnal hypoxemia.   Ascorbic Acid (VITAMIN C) 1000 MG tablet    aspirin  81 MG tablet Take 81 mg by mouth daily.   cholecalciferol (VITAMIN D3) 25 MCG (1000 UNIT) tablet    Cyanocobalamin  (VITAMIN B 12 PO) Take 1,000 mcg by mouth daily.   diclofenac Sodium (VOLTAREN) 1 % GEL diclofenac 1 % topical gel  APPLY 2 GRAMS TO THE AFFECTED AREA(S) BY TOPICAL ROUTE 4 TIMES PER DAY   diphenoxylate -atropine  (LOMOTIL ) 2.5-0.025 MG tablet Take 1 tablet by mouth 3 (three) times daily as needed for diarrhea or loose stools.   losartan  (COZAAR ) 50 MG tablet Take 1 tablet (50 mg total) by  mouth daily.   meloxicam  (MOBIC ) 15 MG tablet Take 1 tablet (15 mg total) by mouth daily as needed for pain.   pantoprazole  (PROTONIX ) 40 MG tablet TAKE 1 TABLET BY MOUTH EVERY DAY   rosuvastatin   (CRESTOR ) 40 MG tablet TAKE 1 TABLET BY MOUTH DAILY **OFFICE VISIT REQUIRED**   sertraline  (ZOLOFT ) 50 MG tablet TAKE 1 TABLET BY MOUTH EVERY DAY   zolpidem  (AMBIEN ) 10 MG tablet TAKE A HALF TO 1 TABLET BY MOUTH EVERY NIGHT AT BEDTIME AS NEEDED   No facility-administered encounter medications on file as of 01/27/2024.    History: Past Medical History:  Diagnosis Date   Anxiety    Arthritis    BPH (benign prostatic hypertrophy)    CAD (coronary artery disease) 2010   Blockage in artery/ widow maker   Cataract    CHF (congestive heart failure) (HCC)    Depression    Diarrhea    GERD (gastroesophageal reflux disease)    Hyperlipidemia    Hypertension    Obesity    OSA on CPAP    Oxygen  deficiency    Pleurisy    Sleep apnea    Past Surgical History:  Procedure Laterality Date   LEFT AND RIGHT HEART CATHETERIZATION WITH CORONARY ANGIOGRAM N/A 03/05/2011   Procedure: LEFT AND RIGHT HEART CATHETERIZATION WITH CORONARY ANGIOGRAM;  Surgeon: Ezra GORMAN Shuck, MD;  Location: [facility] CATH LAB;  Service: Cardiovascular;  Laterality: N/A;   LUNG DECORTICATION  1996   right with drainage utilizing VAT for empyema   TONSILLECTOMY     Family History  Problem Relation Age of Onset   Cancer Father 59       liver and lung   Heart disease Mother    Ulcers Mother    Hypertension Mother    Aneurysm Mother    Miscarriages / Stillbirths Mother    Obesity Mother    Hyperlipidemia Sister    Hypertension Sister    Colon cancer Maternal Grandmother        late 76's when dx    Hypertension Brother    Esophageal cancer Neg Hx    Stomach cancer Neg Hx    Rectal cancer Neg Hx    Social History   Occupational History   Occupation: Retired  Tobacco Use   Smoking status: Never    Passive exposure: Never   Smokeless tobacco: Never   Tobacco comments:    10/25/19 no smoking at all  Vaping Use   Vaping status: Never Used  Substance and Sexual Activity   Alcohol use: No   Drug use: No   Sexual  activity: Not Currently    Birth control/protection: None   Tobacco Counseling Counseling given: Not Answered Tobacco comments: 10/25/19 no smoking at all  SDOH Screenings   Food Insecurity: No Food Insecurity (01/27/2024)  Housing: Low Risk (01/27/2024)  Transportation Needs: No Transportation Needs (01/27/2024)  Utilities: Not At Risk (01/27/2024)  Alcohol Screen: Low Risk (11/28/2021)  Depression (PHQ2-9): Low Risk (01/27/2024)  Financial Resource Strain: Low Risk (07/23/2023)  Physical Activity: Insufficiently Active (01/27/2024)  Social Connections: Socially Integrated (01/27/2024)  Stress: No Stress Concern Present (01/27/2024)  Tobacco Use: Low Risk (01/27/2024)  Health Literacy: Adequate Health Literacy (01/27/2024)   See flowsheets for full screening details  Depression Screen PHQ 2 & 9 Depression Scale- Over the past 2 weeks, how often have you been bothered by any of the following problems? Little interest or pleasure in doing things: 0 Feeling down, depressed, or hopeless (PHQ  Adolescent also includes...irritable): 0 PHQ-2 Total Score: 0 Trouble falling or staying asleep, or sleeping too much: 1 Feeling tired or having little energy: 1 Poor appetite or overeating (PHQ Adolescent also includes...weight loss): 1 Feeling bad about yourself - or that you are a failure or have let yourself or your family down: 0 Trouble concentrating on things, such as reading the newspaper or watching television (PHQ Adolescent also includes...like school work): 0 Moving or speaking so slowly that other people could have noticed. Or the opposite - being so fidgety or restless that you have been moving around a lot more than usual: 0 Thoughts that you would be better off dead, or of hurting yourself in some way: 0 PHQ-9 Total Score: 4 If you checked off any problems, how difficult have these problems made it for you to do your work, take care of things at home, or get along with other  people?: Not difficult at all  Depression Treatment Depression Interventions/Treatment : EYV7-0 Score <4 Follow-up Not Indicated     Goals Addressed             This Visit's Progress    Patient Stated       Patient states he would like to lose some weight.              Objective:    Today's Vitals   01/27/24 0943  BP: (!) 127/52  Pulse: (!) 57  Weight: 261 lb (118.4 kg)  Height: 6' 3 (1.905 m)   Body mass index is 32.62 kg/m.  Hearing/Vision screen No results found. Immunizations and Health Maintenance Health Maintenance  Topic Date Due   COVID-19 Vaccine (5 - 2025-26 season) 09/29/2023   Medicare Annual Wellness (AWV)  01/26/2025   DTaP/Tdap/Td (5 - Td or Tdap) 08/15/2032   Pneumococcal Vaccine: 50+ Years  Completed   Influenza Vaccine  Completed   Hepatitis C Screening  Completed   Zoster Vaccines- Shingrix  Completed   Meningococcal B Vaccine  Aged Out   Colonoscopy  Discontinued        Assessment/Plan:  This is a routine wellness examination for [employer].  Patient Care Team: Alvan Dorothyann BIRCH, MD as PCP - General (Family Medicine) Pietro Redell RAMAN, MD as Consulting Physician (Cardiology) Jarold Mayo, MD as Consulting Physician (Ophthalmology) Charmayne Molly, MD as Consulting Physician (Ophthalmology)  I have personally reviewed and noted the following in the patients chart:   Medical and social history Use of alcohol, tobacco or illicit drugs  Current medications and supplements including opioid prescriptions. Functional ability and status Nutritional status Physical activity Advanced directives List of other physicians Hospitalizations, surgeries, and ER visits in previous 12 months Vitals Screenings to include cognitive, depression, and falls Referrals and appointments  No orders of the defined types were placed in this encounter.  In addition, I have reviewed and discussed with patient certain preventive protocols, quality  metrics, and best practice recommendations. A written personalized care plan for preventive services as well as general preventive health recommendations were provided to patient.   Bonny Jon Mayor, CMA   01/27/2024   Return in 1 year (on 01/26/2025).  After Visit Summary: (In Person-Declined) Patient declined AVS at this time.  Nurse Notes:   Jeremy Woodward is a 78 y.o. male patient of Dorothyann Alvan, MD who had a Medicare Annual Wellness Visit today. Jeremy Woodward lives with his wife. He reports that he is socially active and does interact with friends/family regularly. He is moderately physically active.   "

## 2024-01-27 NOTE — Progress Notes (Signed)
 "  Established Patient Office Visit  Patient ID: Jeremy Woodward, male    DOB: Apr 09, 1945  Age: 78 y.o. MRN: [id number] PCP: Alvan Dorothyann BIRCH, MD  Chief Complaint  Patient presents with   ifg   Hypertension    Subjective:     HPI  Discussed the use of AI scribe software for clinical note transcription with the patient, who gave verbal consent to proceed.  History of Present Illness Jeremy Woodward is a 78 year old male who presents for blood tests to monitor prostate health and kidney function.  Musculoskeletal pain and muscle cramps - Increased pain and muscle cramps in the mid to upper back region - Attributes discomfort to scar tissue from previous lung surgery - Engages in bending and stretching routines, and uses five-pound weights for upper back exercises - Exercises provide some relief  Analgesic use - Increased use of over-the-counter Tylenol  for pain relief.  Sometimes takes once or twice a day - Avoids prescription pain medications  Renal and prostate health monitoring - Anxious to have blood tests performed to monitor prostate health and kidney function  Glycemic control and weight fluctuations - History of prediabetes - Currently taking metformin ; previously stopped when glucose readings were good, but resumed after weight gain - Recent weight gain, particularly after holiday meals - Working on dietary modifications to manage weight and blood sugar levels  Visual disturbance - History of cataract surgery - Persistent fogginess in vision, especially in certain lighting conditions     ROS    Objective:     BP (!) 127/52   Pulse (!) 57   Ht 6' 3 (1.905 m)   Wt 261 lb 1.9 oz (118.4 kg)   SpO2 99%   BMI 32.64 kg/m    Physical Exam Vitals and nursing note reviewed.  Constitutional:      Appearance: Normal appearance.  HENT:     Head: Normocephalic and atraumatic.  Eyes:     Conjunctiva/sclera: Conjunctivae normal.  Cardiovascular:      Rate and Rhythm: Normal rate and regular rhythm.  Pulmonary:     Effort: Pulmonary effort is normal.     Breath sounds: Normal breath sounds.  Skin:    General: Skin is warm and dry.  Neurological:     Mental Status: He is alert.  Psychiatric:        Mood and Affect: Mood normal.      Results for orders placed or performed in visit on 01/27/24  POCT HgB A1C  Result Value Ref Range   Hemoglobin A1C 5.5 4.0 - 5.6 %   HbA1c POC (<> result, manual entry)     HbA1c, POC (prediabetic range)     HbA1c, POC (controlled diabetic range)        The ASCVD Risk score (Arnett DK, et al., 2019) failed to calculate for the following reasons:   The valid total cholesterol range is 130 to 320 mg/dL    Assessment & Plan:   Problem List Items Addressed This Visit       Cardiovascular and Mediastinum   Benign essential hypertension   Relevant Orders   CMP14+EGFR     Endocrine   IFG (impaired fasting glucose) - Primary   Relevant Orders   POCT HgB A1C (Completed)   CMP14+EGFR     Genitourinary   CKD (chronic kidney disease) stage 3, GFR 30-59 ml/min (HCC)   Relevant Orders   CMP14+EGFR     Other   Depression, major, single  episode, in partial remission   Other Visit Diagnoses       Screening for malignant neoplasm of prostate       Relevant Orders   PSA     Bilateral hearing loss, unspecified hearing loss type           Assessment and Plan Assessment & Plan Impaired fasting glucose A1c 5.5, normal glucose levels. Resumed metformin  with weight loss noted. Insurance does not cover weight loss injections for prediabetes. - Continue metformin . - Encouraged healthy diet with more lean proteins, vegetables, and less carbs. - Encouraged moderation in eating habits, including portion control and delayed eating strategies.  Stage 3a chronic kidney disease Slight kidney function impairment. Advised to avoid NSAIDs due to potential kidney damage. Tylenol  preferred for pain  management. - Continue using Tylenol  for pain management. - Avoid NSAIDs like Aleve and ibuprofen.  Chronic back pain Chronic mid to upper back pain with muscle cramps. Exercises from previous physical therapy. Discussed potential benefit of physical therapy. - Provided handout for upper and lower back exercises. - Encouraged continuation of home exercises. - Will consider physical therapy if pain persists.  History of cataract surgery with persistent vitreous clouding and retinal hole Persistent vitreous clouding and retinal hole post-surgery. Retina specialist recommended vitrectomy. Considering surgery after reviewing blood test results. - Await blood test results before deciding on vitrectomy.  Hearing loss Considering purchasing hearing aids from [employer]. No significant blockage in ear canal or eardrum. - Consider purchasing hearing aids from [employer].  General health maintenance Due for prostate blood test and recheck of kidney and potassium levels. - Ordered prostate blood test. - Ordered recheck of kidney and potassium levels.   Return in about 6 months (around 07/27/2024) for Hypertension, Mood.    Dorothyann Byars, MD [facility] at [facility]   "

## 2024-01-28 ENCOUNTER — Ambulatory Visit: Payer: Self-pay | Admitting: Family Medicine

## 2024-01-28 LAB — CMP14+EGFR
ALT: 26 IU/L (ref 0–44)
AST: 28 IU/L (ref 0–40)
Albumin: 4.5 g/dL (ref 3.8–4.8)
Alkaline Phosphatase: 51 IU/L (ref 47–123)
BUN/Creatinine Ratio: 20 (ref 10–24)
BUN: 23 mg/dL (ref 8–27)
Bilirubin Total: 0.5 mg/dL (ref 0.0–1.2)
CO2: 20 mmol/L (ref 20–29)
Calcium: 9.2 mg/dL (ref 8.6–10.2)
Chloride: 104 mmol/L (ref 96–106)
Creatinine, Ser: 1.15 mg/dL (ref 0.76–1.27)
Globulin, Total: 1.7 g/dL (ref 1.5–4.5)
Glucose: 92 mg/dL (ref 70–99)
Potassium: 4.7 mmol/L (ref 3.5–5.2)
Sodium: 138 mmol/L (ref 134–144)
Total Protein: 6.2 g/dL (ref 6.0–8.5)
eGFR: 65 mL/min/1.73

## 2024-01-28 LAB — PSA: Prostate Specific Ag, Serum: 3.5 ng/mL (ref 0.0–4.0)

## 2024-01-28 NOTE — Progress Notes (Signed)
 Your lab work is within acceptable range and there are no concerning findings.   ?

## 2024-01-30 NOTE — Progress Notes (Signed)
 No,  we can just repeat in 6 months again it has been stable over the last year so it is not trending upward.

## 2024-02-02 ENCOUNTER — Encounter: Payer: Self-pay | Admitting: Family Medicine

## 2024-02-02 DIAGNOSIS — R7301 Impaired fasting glucose: Secondary | ICD-10-CM

## 2024-02-02 DIAGNOSIS — M549 Dorsalgia, unspecified: Secondary | ICD-10-CM

## 2024-02-02 DIAGNOSIS — K529 Noninfective gastroenteritis and colitis, unspecified: Secondary | ICD-10-CM

## 2024-02-04 MED ORDER — METFORMIN HCL ER 500 MG PO TB24: 500.0000 mg | ORAL_TABLET | Freq: Every day | ORAL | 1 refills | Status: AC

## 2024-02-04 MED ORDER — DIPHENOXYLATE-ATROPINE 2.5-0.025 MG PO TABS: 1.0000 | ORAL_TABLET | Freq: Three times a day (TID) | ORAL | 0 refills | Status: AC | PRN

## 2024-02-04 MED ORDER — MELOXICAM 15 MG PO TABS: 15.0000 mg | ORAL_TABLET | Freq: Every day | ORAL | 1 refills | Status: AC | PRN

## 2024-02-04 MED ORDER — LOSARTAN POTASSIUM 50 MG PO TABS: 50.0000 mg | ORAL_TABLET | Freq: Every day | ORAL | 3 refills | Status: AC

## 2024-05-19 ENCOUNTER — Ambulatory Visit: Admitting: Dermatology

## 2024-06-14 ENCOUNTER — Ambulatory Visit: Admitting: Cardiology

## 2024-07-27 ENCOUNTER — Ambulatory Visit: Admitting: Family Medicine

## 2025-01-27 ENCOUNTER — Ambulatory Visit
